# Patient Record
Sex: Female | Born: 1935 | Race: White | Hispanic: No | Marital: Single | Smoking: Never smoker
Health system: Southern US, Community
[De-identification: ages and names within clinical notes are randomized; demographics above are authoritative.]

## PROBLEM LIST (undated history)

## (undated) DIAGNOSIS — I639 Cerebral infarction, unspecified: Secondary | ICD-10-CM

## (undated) DIAGNOSIS — I10 Essential (primary) hypertension: Secondary | ICD-10-CM

## (undated) DIAGNOSIS — R32 Unspecified urinary incontinence: Secondary | ICD-10-CM

## (undated) DIAGNOSIS — R5383 Other fatigue: Secondary | ICD-10-CM

## (undated) DIAGNOSIS — M1990 Unspecified osteoarthritis, unspecified site: Secondary | ICD-10-CM

## (undated) DIAGNOSIS — E785 Hyperlipidemia, unspecified: Secondary | ICD-10-CM

## (undated) HISTORY — PX: VEIN LIGATION: SHX2652

## (undated) HISTORY — DX: Unspecified urinary incontinence: R32

## (undated) HISTORY — DX: Unspecified osteoarthritis, unspecified site: M19.90

## (undated) HISTORY — DX: Essential (primary) hypertension: I10

## (undated) HISTORY — DX: Hyperlipidemia, unspecified: E78.5

## (undated) HISTORY — DX: Other fatigue: R53.83

## (undated) HISTORY — PX: CATARACT EXTRACTION: SUR2

## (undated) HISTORY — DX: Cerebral infarction, unspecified: I63.9

## (undated) HISTORY — PX: ABDOMINAL HYSTERECTOMY: SHX81

---

## 1999-08-08 ENCOUNTER — Other Ambulatory Visit: Admission: RE | Admit: 1999-08-08 | Discharge: 1999-09-03 | Payer: Self-pay | Admitting: *Deleted

## 2000-05-12 ENCOUNTER — Ambulatory Visit (HOSPITAL_COMMUNITY): Admission: RE | Admit: 2000-05-12 | Discharge: 2000-05-12 | Payer: Self-pay | Admitting: *Deleted

## 2005-02-14 ENCOUNTER — Ambulatory Visit: Payer: Self-pay | Admitting: Family Medicine

## 2005-02-28 ENCOUNTER — Ambulatory Visit: Payer: Self-pay | Admitting: Family Medicine

## 2005-07-02 ENCOUNTER — Ambulatory Visit: Payer: Self-pay | Admitting: Gastroenterology

## 2005-07-16 ENCOUNTER — Ambulatory Visit: Payer: Self-pay | Admitting: Gastroenterology

## 2005-08-28 ENCOUNTER — Ambulatory Visit: Payer: Self-pay | Admitting: Family Medicine

## 2006-02-19 ENCOUNTER — Encounter: Admission: RE | Admit: 2006-02-19 | Discharge: 2006-02-19 | Payer: Self-pay | Admitting: Family Medicine

## 2006-02-19 ENCOUNTER — Other Ambulatory Visit: Admission: RE | Admit: 2006-02-19 | Discharge: 2006-02-19 | Payer: Self-pay | Admitting: Family Medicine

## 2006-02-19 ENCOUNTER — Ambulatory Visit: Payer: Self-pay | Admitting: Family Medicine

## 2006-02-26 ENCOUNTER — Encounter: Admission: RE | Admit: 2006-02-26 | Discharge: 2006-02-26 | Payer: Self-pay | Admitting: Family Medicine

## 2006-08-06 ENCOUNTER — Ambulatory Visit: Payer: Self-pay | Admitting: Family Medicine

## 2006-11-21 ENCOUNTER — Ambulatory Visit: Payer: Self-pay | Admitting: Family Medicine

## 2007-02-23 DIAGNOSIS — M1990 Unspecified osteoarthritis, unspecified site: Secondary | ICD-10-CM | POA: Insufficient documentation

## 2007-02-23 DIAGNOSIS — Z8719 Personal history of other diseases of the digestive system: Secondary | ICD-10-CM

## 2007-02-23 DIAGNOSIS — T148XXA Other injury of unspecified body region, initial encounter: Secondary | ICD-10-CM | POA: Insufficient documentation

## 2007-02-23 DIAGNOSIS — I10 Essential (primary) hypertension: Secondary | ICD-10-CM | POA: Insufficient documentation

## 2007-03-10 ENCOUNTER — Ambulatory Visit: Payer: Self-pay | Admitting: Family Medicine

## 2007-03-10 DIAGNOSIS — H8110 Benign paroxysmal vertigo, unspecified ear: Secondary | ICD-10-CM

## 2007-03-11 ENCOUNTER — Encounter: Payer: Self-pay | Admitting: Family Medicine

## 2007-03-26 ENCOUNTER — Ambulatory Visit: Payer: Self-pay | Admitting: Family Medicine

## 2007-04-13 ENCOUNTER — Encounter: Payer: Self-pay | Admitting: Family Medicine

## 2007-04-17 ENCOUNTER — Telehealth: Payer: Self-pay | Admitting: Family Medicine

## 2007-04-17 ENCOUNTER — Ambulatory Visit: Payer: Self-pay | Admitting: Family Medicine

## 2007-06-01 ENCOUNTER — Ambulatory Visit: Payer: Self-pay | Admitting: Family Medicine

## 2007-07-02 ENCOUNTER — Ambulatory Visit: Payer: Self-pay | Admitting: Family Medicine

## 2007-07-02 DIAGNOSIS — M545 Low back pain, unspecified: Secondary | ICD-10-CM | POA: Insufficient documentation

## 2007-07-02 DIAGNOSIS — M25559 Pain in unspecified hip: Secondary | ICD-10-CM

## 2007-07-23 ENCOUNTER — Encounter: Payer: Self-pay | Admitting: Family Medicine

## 2007-07-25 ENCOUNTER — Encounter: Admission: RE | Admit: 2007-07-25 | Discharge: 2007-07-25 | Payer: Self-pay | Admitting: Sports Medicine

## 2007-07-27 ENCOUNTER — Ambulatory Visit: Payer: Self-pay | Admitting: Family Medicine

## 2007-07-31 ENCOUNTER — Encounter (INDEPENDENT_AMBULATORY_CARE_PROVIDER_SITE_OTHER): Payer: Self-pay | Admitting: *Deleted

## 2007-09-08 ENCOUNTER — Encounter: Payer: Self-pay | Admitting: Family Medicine

## 2007-09-08 ENCOUNTER — Telehealth (INDEPENDENT_AMBULATORY_CARE_PROVIDER_SITE_OTHER): Payer: Self-pay | Admitting: *Deleted

## 2007-09-08 ENCOUNTER — Emergency Department (HOSPITAL_COMMUNITY): Admission: EM | Admit: 2007-09-08 | Discharge: 2007-09-08 | Payer: Self-pay | Admitting: Emergency Medicine

## 2007-09-09 ENCOUNTER — Telehealth: Payer: Self-pay | Admitting: Family Medicine

## 2007-10-14 ENCOUNTER — Telehealth (INDEPENDENT_AMBULATORY_CARE_PROVIDER_SITE_OTHER): Payer: Self-pay | Admitting: *Deleted

## 2007-10-14 ENCOUNTER — Ambulatory Visit: Payer: Self-pay | Admitting: Family Medicine

## 2007-10-19 ENCOUNTER — Encounter (INDEPENDENT_AMBULATORY_CARE_PROVIDER_SITE_OTHER): Payer: Self-pay | Admitting: *Deleted

## 2008-01-08 ENCOUNTER — Telehealth (INDEPENDENT_AMBULATORY_CARE_PROVIDER_SITE_OTHER): Payer: Self-pay | Admitting: *Deleted

## 2008-01-11 ENCOUNTER — Encounter: Payer: Self-pay | Admitting: Family Medicine

## 2008-01-11 ENCOUNTER — Telehealth (INDEPENDENT_AMBULATORY_CARE_PROVIDER_SITE_OTHER): Payer: Self-pay | Admitting: *Deleted

## 2008-01-19 ENCOUNTER — Encounter: Payer: Self-pay | Admitting: Family Medicine

## 2008-01-19 ENCOUNTER — Telehealth (INDEPENDENT_AMBULATORY_CARE_PROVIDER_SITE_OTHER): Payer: Self-pay | Admitting: *Deleted

## 2008-01-25 ENCOUNTER — Encounter: Payer: Self-pay | Admitting: Family Medicine

## 2008-01-25 ENCOUNTER — Telehealth (INDEPENDENT_AMBULATORY_CARE_PROVIDER_SITE_OTHER): Payer: Self-pay | Admitting: *Deleted

## 2008-02-01 ENCOUNTER — Telehealth (INDEPENDENT_AMBULATORY_CARE_PROVIDER_SITE_OTHER): Payer: Self-pay | Admitting: *Deleted

## 2008-02-04 ENCOUNTER — Telehealth (INDEPENDENT_AMBULATORY_CARE_PROVIDER_SITE_OTHER): Payer: Self-pay | Admitting: *Deleted

## 2008-02-24 ENCOUNTER — Telehealth (INDEPENDENT_AMBULATORY_CARE_PROVIDER_SITE_OTHER): Payer: Self-pay | Admitting: *Deleted

## 2008-03-01 ENCOUNTER — Encounter: Payer: Self-pay | Admitting: Family Medicine

## 2008-03-14 ENCOUNTER — Encounter (INDEPENDENT_AMBULATORY_CARE_PROVIDER_SITE_OTHER): Payer: Self-pay | Admitting: *Deleted

## 2008-05-24 ENCOUNTER — Ambulatory Visit: Payer: Self-pay | Admitting: Family Medicine

## 2008-05-26 ENCOUNTER — Encounter (INDEPENDENT_AMBULATORY_CARE_PROVIDER_SITE_OTHER): Payer: Self-pay | Admitting: *Deleted

## 2008-06-05 LAB — CONVERTED CEMR LAB
AST: 24 units/L (ref 0–37)
Alkaline Phosphatase: 56 units/L (ref 39–117)
BUN: 9 mg/dL (ref 6–23)
Basophils Absolute: 0.1 10*3/uL (ref 0.0–0.1)
CO2: 29 meq/L (ref 19–32)
Chloride: 103 meq/L (ref 96–112)
Eosinophils Absolute: 0.3 10*3/uL (ref 0.0–0.7)
Eosinophils Relative: 4.4 % (ref 0.0–5.0)
GFR calc non Af Amer: 86 mL/min
HDL: 62 mg/dL (ref >39.0)
Lymphocytes Relative: 24.5 % (ref 12.0–46.0)
MCV: 93.8 fL (ref 78.0–100.0)
Neutrophils Relative %: 62.7 % (ref 43.0–77.0)
Platelets: 200 10*3/uL (ref 150–400)
Potassium: 4.1 meq/L (ref 3.5–5.1)
Total Bilirubin: 0.8 mg/dL (ref 0.3–1.2)
Triglycerides: 84 mg/dL (ref 0–149)
VLDL: 17 mg/dL (ref 0–40)
WBC: 7.4 10*3/uL (ref 4.5–10.5)

## 2008-06-06 ENCOUNTER — Ambulatory Visit: Payer: Self-pay | Admitting: Family Medicine

## 2008-06-06 ENCOUNTER — Encounter (INDEPENDENT_AMBULATORY_CARE_PROVIDER_SITE_OTHER): Payer: Self-pay | Admitting: *Deleted

## 2008-06-06 DIAGNOSIS — N390 Urinary tract infection, site not specified: Secondary | ICD-10-CM

## 2008-06-07 ENCOUNTER — Encounter: Payer: Self-pay | Admitting: Family Medicine

## 2008-06-08 ENCOUNTER — Encounter (INDEPENDENT_AMBULATORY_CARE_PROVIDER_SITE_OTHER): Payer: Self-pay | Admitting: *Deleted

## 2008-06-14 ENCOUNTER — Encounter (INDEPENDENT_AMBULATORY_CARE_PROVIDER_SITE_OTHER): Payer: Self-pay | Admitting: *Deleted

## 2008-07-11 ENCOUNTER — Ambulatory Visit: Payer: Self-pay | Admitting: Family Medicine

## 2008-07-18 ENCOUNTER — Encounter: Payer: Self-pay | Admitting: Family Medicine

## 2008-08-29 ENCOUNTER — Encounter: Payer: Self-pay | Admitting: Family Medicine

## 2008-10-03 ENCOUNTER — Ambulatory Visit: Payer: Self-pay | Admitting: Family Medicine

## 2008-10-03 DIAGNOSIS — M79609 Pain in unspecified limb: Secondary | ICD-10-CM

## 2008-10-04 ENCOUNTER — Ambulatory Visit: Payer: Self-pay | Admitting: Family Medicine

## 2009-05-15 ENCOUNTER — Encounter: Payer: Self-pay | Admitting: Family Medicine

## 2009-05-29 ENCOUNTER — Ambulatory Visit: Payer: Self-pay | Admitting: Family Medicine

## 2009-05-29 DIAGNOSIS — E785 Hyperlipidemia, unspecified: Secondary | ICD-10-CM

## 2009-06-02 ENCOUNTER — Encounter (INDEPENDENT_AMBULATORY_CARE_PROVIDER_SITE_OTHER): Payer: Self-pay | Admitting: *Deleted

## 2009-06-08 ENCOUNTER — Ambulatory Visit: Payer: Self-pay | Admitting: Family Medicine

## 2009-07-04 ENCOUNTER — Ambulatory Visit: Payer: Self-pay | Admitting: Family Medicine

## 2009-07-05 ENCOUNTER — Encounter: Payer: Self-pay | Admitting: Family Medicine

## 2009-07-05 LAB — CONVERTED CEMR LAB
OCCULT 2: NEGATIVE
OCCULT 3: NEGATIVE

## 2009-08-11 ENCOUNTER — Ambulatory Visit: Payer: Self-pay | Admitting: Family Medicine

## 2009-09-07 ENCOUNTER — Ambulatory Visit: Payer: Self-pay | Admitting: Family Medicine

## 2009-11-01 ENCOUNTER — Ambulatory Visit: Payer: Self-pay | Admitting: Family

## 2009-11-01 ENCOUNTER — Inpatient Hospital Stay (HOSPITAL_COMMUNITY): Admission: AD | Admit: 2009-11-01 | Discharge: 2009-11-03 | Payer: Self-pay

## 2009-11-01 DIAGNOSIS — R5383 Other fatigue: Secondary | ICD-10-CM

## 2009-11-01 DIAGNOSIS — Z8679 Personal history of other diseases of the circulatory system: Secondary | ICD-10-CM | POA: Insufficient documentation

## 2009-11-01 DIAGNOSIS — R5381 Other malaise: Secondary | ICD-10-CM

## 2009-11-01 DIAGNOSIS — I639 Cerebral infarction, unspecified: Secondary | ICD-10-CM

## 2009-11-01 HISTORY — DX: Cerebral infarction, unspecified: I63.9

## 2009-11-02 ENCOUNTER — Encounter (INDEPENDENT_AMBULATORY_CARE_PROVIDER_SITE_OTHER): Payer: Self-pay | Admitting: Internal Medicine

## 2009-11-03 ENCOUNTER — Encounter: Payer: Self-pay | Admitting: Family Medicine

## 2009-11-06 ENCOUNTER — Telehealth: Payer: Self-pay | Admitting: Family Medicine

## 2009-11-07 ENCOUNTER — Telehealth (INDEPENDENT_AMBULATORY_CARE_PROVIDER_SITE_OTHER): Payer: Self-pay | Admitting: *Deleted

## 2009-11-08 ENCOUNTER — Encounter: Admission: RE | Admit: 2009-11-08 | Discharge: 2009-12-14 | Payer: Self-pay | Admitting: Family Medicine

## 2009-11-09 ENCOUNTER — Ambulatory Visit: Payer: Self-pay | Admitting: Family Medicine

## 2009-11-13 ENCOUNTER — Encounter: Payer: Self-pay | Admitting: Family Medicine

## 2009-11-14 ENCOUNTER — Encounter: Payer: Self-pay | Admitting: Family Medicine

## 2009-11-15 LAB — CONVERTED CEMR LAB
Bacteria, UA: NONE SEEN
Bilirubin Urine: NEGATIVE
Hemoglobin, Urine: NEGATIVE
Ketones, ur: NEGATIVE mg/dL
Protein, ur: NEGATIVE mg/dL
RBC / HPF: NONE SEEN (ref <3)
Urobilinogen, UA: 0.2 (ref 0.0–1.0)

## 2009-12-01 ENCOUNTER — Telehealth: Payer: Self-pay | Admitting: Family Medicine

## 2009-12-14 ENCOUNTER — Encounter: Payer: Self-pay | Admitting: Family Medicine

## 2010-01-11 ENCOUNTER — Telehealth (INDEPENDENT_AMBULATORY_CARE_PROVIDER_SITE_OTHER): Payer: Self-pay | Admitting: *Deleted

## 2010-02-05 ENCOUNTER — Encounter: Payer: Self-pay | Admitting: Family Medicine

## 2010-02-06 ENCOUNTER — Ambulatory Visit: Payer: Self-pay | Admitting: Family Medicine

## 2010-02-06 DIAGNOSIS — I739 Peripheral vascular disease, unspecified: Secondary | ICD-10-CM | POA: Insufficient documentation

## 2010-02-08 ENCOUNTER — Encounter: Payer: Self-pay | Admitting: Family Medicine

## 2010-02-09 ENCOUNTER — Encounter: Payer: Self-pay | Admitting: Family Medicine

## 2010-02-09 ENCOUNTER — Ambulatory Visit: Payer: Self-pay

## 2010-02-09 ENCOUNTER — Telehealth: Payer: Self-pay | Admitting: Family Medicine

## 2010-04-17 ENCOUNTER — Telehealth (INDEPENDENT_AMBULATORY_CARE_PROVIDER_SITE_OTHER): Payer: Self-pay | Admitting: *Deleted

## 2010-05-08 ENCOUNTER — Telehealth: Payer: Self-pay | Admitting: Family Medicine

## 2010-05-10 ENCOUNTER — Ambulatory Visit: Payer: Self-pay | Admitting: Family Medicine

## 2010-05-10 DIAGNOSIS — IMO0001 Reserved for inherently not codable concepts without codable children: Secondary | ICD-10-CM | POA: Insufficient documentation

## 2010-05-11 LAB — CONVERTED CEMR LAB: Total CK: 147 units/L (ref 7–177)

## 2010-05-25 ENCOUNTER — Telehealth: Payer: Self-pay | Admitting: Family Medicine

## 2010-07-25 ENCOUNTER — Ambulatory Visit: Payer: Self-pay | Admitting: Family Medicine

## 2010-07-26 ENCOUNTER — Encounter: Payer: Self-pay | Admitting: Family Medicine

## 2010-07-26 ENCOUNTER — Telehealth (INDEPENDENT_AMBULATORY_CARE_PROVIDER_SITE_OTHER): Payer: Self-pay | Admitting: *Deleted

## 2010-07-30 ENCOUNTER — Encounter: Payer: Self-pay | Admitting: Family Medicine

## 2010-07-31 ENCOUNTER — Encounter: Payer: Self-pay | Admitting: Family Medicine

## 2010-07-31 ENCOUNTER — Encounter: Admission: RE | Admit: 2010-07-31 | Discharge: 2010-07-31 | Payer: Self-pay | Admitting: Neurology

## 2010-08-01 ENCOUNTER — Encounter: Payer: Self-pay | Admitting: Family Medicine

## 2010-08-09 ENCOUNTER — Encounter: Payer: Self-pay | Admitting: Family Medicine

## 2010-08-09 ENCOUNTER — Ambulatory Visit: Payer: Self-pay | Admitting: Family Medicine

## 2010-08-09 DIAGNOSIS — I6350 Cerebral infarction due to unspecified occlusion or stenosis of unspecified cerebral artery: Secondary | ICD-10-CM | POA: Insufficient documentation

## 2010-08-10 LAB — CONVERTED CEMR LAB
ALT: 23 units/L (ref 0–35)
AST: 30 units/L (ref 0–37)
Albumin: 3.9 g/dL (ref 3.5–5.2)
BUN: 11 mg/dL (ref 6–23)
Basophils Relative: 0.4 % (ref 0.0–3.0)
Chloride: 101 meq/L (ref 96–112)
Direct LDL: 114.4 mg/dL
Eosinophils Relative: 1.5 % (ref 0.0–5.0)
GFR calc non Af Amer: 81.21 mL/min (ref >60)
HCT: 42.4 % (ref 36.0–46.0)
Hemoglobin: 14.3 g/dL (ref 12.0–15.0)
Hgb A1c MFr Bld: 5.9 % (ref 4.6–6.5)
Lymphs Abs: 1.6 10*3/uL (ref 0.7–4.0)
MCV: 95.2 fL (ref 78.0–100.0)
Monocytes Absolute: 0.5 10*3/uL (ref 0.1–1.0)
Monocytes Relative: 5.4 % (ref 3.0–12.0)
Neutro Abs: 6.5 10*3/uL (ref 1.4–7.7)
Platelets: 203 10*3/uL (ref 150.0–400.0)
Potassium: 4.5 meq/L (ref 3.5–5.1)
RBC: 4.46 M/uL (ref 3.87–5.11)
Sodium: 137 meq/L (ref 135–145)
Total Protein: 7.1 g/dL (ref 6.0–8.3)
VLDL: 16.4 mg/dL (ref 0.0–40.0)
WBC: 8.8 10*3/uL (ref 4.5–10.5)

## 2010-09-07 ENCOUNTER — Ambulatory Visit: Payer: Self-pay | Admitting: Family Medicine

## 2010-09-18 ENCOUNTER — Encounter: Payer: Self-pay | Admitting: Family Medicine

## 2010-09-19 ENCOUNTER — Telehealth: Payer: Self-pay | Admitting: Family Medicine

## 2010-11-04 LAB — CONVERTED CEMR LAB
ALT: 19 units/L (ref 0–35)
ALT: 22 units/L (ref 0–35)
ALT: 23 units/L (ref 0–40)
AST: 25 units/L (ref 0–37)
AST: 26 units/L (ref 0–37)
Albumin: 3.6 g/dL (ref 3.5–5.2)
Alkaline Phosphatase: 55 units/L (ref 39–117)
BUN: 7 mg/dL (ref 6–23)
BUN: 8 mg/dL (ref 6–23)
Basophils Absolute: 0.1 10*3/uL (ref 0.0–0.1)
Basophils Relative: 0.8 % (ref 0.0–1.0)
Basophils Relative: 0.8 % (ref 0.0–3.0)
Bilirubin Urine: NEGATIVE
Bilirubin, Direct: 0 mg/dL (ref 0.0–0.3)
Bilirubin, Direct: 0.1 mg/dL (ref 0.0–0.3)
Blood in Urine, dipstick: NEGATIVE
CO2: 32 meq/L (ref 19–32)
Calcium: 9.1 mg/dL (ref 8.4–10.5)
Calcium: 9.6 mg/dL (ref 8.4–10.5)
Chloride: 102 meq/L (ref 96–112)
Chloride: 104 meq/L (ref 96–112)
Chloride: 104 meq/L (ref 96–112)
Cholesterol: 203 mg/dL — ABNORMAL HIGH (ref 0–200)
Cholesterol: 211 mg/dL (ref 0–200)
Creatinine, Ser: 0.7 mg/dL (ref 0.4–1.2)
Creatinine, Ser: 0.7 mg/dL (ref 0.4–1.2)
Direct LDL: 104.2 mg/dL
Eosinophils Relative: 1.9 % (ref 0.0–5.0)
Eosinophils Relative: 2.9 % (ref 0.0–5.0)
GFR calc non Af Amer: 85.5 mL/min (ref >60)
Glucose, Urine, Semiquant: NEGATIVE
HCT: 38.4 % (ref 36.0–46.0)
HDL: 78.5 mg/dL (ref >39.00)
Hemoglobin: 14.3 g/dL (ref 12.0–15.0)
Ketones, urine, test strip: NEGATIVE
Lymphs Abs: 1.5 10*3/uL (ref 0.7–4.0)
MCHC: 34.6 g/dL (ref 30.0–36.0)
MCV: 93 fL (ref 78.0–100.0)
MCV: 94.9 fL (ref 78.0–100.0)
Monocytes Absolute: 0.5 10*3/uL (ref 0.1–1.0)
Monocytes Absolute: 0.5 10*3/uL (ref 0.1–1.0)
Monocytes Relative: 4.8 % (ref 3.0–11.0)
Monocytes Relative: 5.8 % (ref 3.0–12.0)
Neutrophils Relative %: 68.6 % (ref 43.0–77.0)
Neutrophils Relative %: 75.8 % (ref 43.0–77.0)
Pap Smear: NORMAL
Platelets: 204 10*3/uL (ref 150–400)
Platelets: 213 10*3/uL (ref 150.0–400.0)
Potassium: 4.1 meq/L (ref 3.5–5.1)
Potassium: 5.1 meq/L (ref 3.5–5.1)
Protein, U semiquant: NEGATIVE
RBC: 4.13 M/uL (ref 3.87–5.11)
RBC: 4.42 M/uL (ref 3.87–5.11)
RDW: 13.6 % (ref 11.5–14.6)
TSH: 1.49 microintl units/mL (ref 0.35–5.50)
Total Bilirubin: 0.6 mg/dL (ref 0.3–1.2)
Total Bilirubin: 0.8 mg/dL (ref 0.3–1.2)
Total CHOL/HDL Ratio: 3.3
Total Protein: 7.2 g/dL (ref 6.0–8.3)
Total Protein: 7.3 g/dL (ref 6.0–8.3)
Triglycerides: 59 mg/dL (ref 0.0–149.0)
Triglycerides: 93 mg/dL (ref 0–149)
VLDL: 11.8 mg/dL (ref 0.0–40.0)
VLDL: 19 mg/dL (ref 0–40)
Vit D, 25-Hydroxy: 58 ng/mL (ref 30–89)
WBC: 7.8 10*3/uL (ref 4.5–10.5)
WBC: 9.3 10*3/uL (ref 4.5–10.5)
pH: 6.5

## 2010-11-06 NOTE — Assessment & Plan Note (Signed)
Summary: DISCUSS LEG HEAVINESS AFTER MINI STROKE/KB   Vital Signs:  Patient profile:   77 year old female Height:      57.75 inches Weight:      145 pounds Temp:     98.8 degrees F oral Pulse rate:   76 / minute BP sitting:   150 / 76  (left arm)  Vitals Entered By: Jeremy Johann CMA (May 10, 2010 9:55 AM) CC: discuss heavyness  in legs   History of Present Illness: Pt here c/o heaviness in L leg since stroke but she feels like it is getting worse.  Pt states she has no pain or numbness.  Pt is worried about falling.     Current Medications (verified): 1)  Vitamin D3 1000 Unit Caps (Cholecalciferol) .Marland Kitchen.. 1 By Mouth Once Daily 2)  Aspirin 325 Mg Tabs (Aspirin) .Marland Kitchen.. 1 By Mouth Once Daily 3)  Crestor 10 Mg Tabs (Rosuvastatin Calcium) .Marland Kitchen.. 1 By Mouth At Bedtime 4)  Metoprolol Tartrate 25 Mg Tabs (Metoprolol Tartrate) .Marland Kitchen.. 1 Tab By Mouth Twice A Day. Needs Ov. 5)  Caltrate 600+d Plus 600-400 Mg-Unit Tabs (Calcium Carbonate-Vit D-Min) .Marland Kitchen.. 1 By Mouth Two Times A Day 6)  Fish Oil 1000 Mg Caps (Omega-3 Fatty Acids) .Marland Kitchen.. 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   Neurologic:  alert & oriented X3, strength normal in all extremities, and gait normal.   Psych:  Cognition and judgment appear intact. Alert and cooperative with normal attention span and concentration. No apparent delusions, illusions, hallucinations   Impression & Recommendations:  Problem # 1:  MYALGIA (ICD-729.1) consider neuro if no improvement Her updated medication list for this problem includes:    Aspirin 325 Mg Tabs (Aspirin) .Marland Kitchen... 1 by mouth once daily  Orders: Venipuncture (63875) Specimen Handling (64332) TLB-CK Total Only(Creatine Kinase/CPK) (82550-CK)  Problem # 2:  WEAKNESS (ICD-780.79)  Complete Medication List: 1)  Vitamin D3 1000 Unit Caps (Cholecalciferol) .Marland Kitchen.. 1 by mouth once daily 2)  Aspirin 325 Mg Tabs (Aspirin) .Marland Kitchen.. 1 by mouth once daily 3)  Crestor 10 Mg Tabs (Rosuvastatin calcium) .Marland Kitchen.. 1 by mouth at bedtime 4)  Metoprolol Tartrate 25 Mg Tabs (Metoprolol tartrate) .Marland Kitchen.. 1 tab by mouth twice a day. needs ov. 5)  Caltrate 600+d Plus 600-400 Mg-unit Tabs (Calcium carbonate-vit d-min) .Marland Kitchen.. 1 by mouth two times a day 6)  Fish Oil 1000 Mg Caps (Omega-3 fatty acids) .Marland Kitchen.. 1 by mouth once daily

## 2010-11-06 NOTE — Miscellaneous (Signed)
Summary: Orders Update  Clinical Lists Changes  Orders: Added new Test order of Arterial Duplex Lower Extremity (Arterial Duplex Low) - Signed 

## 2010-11-06 NOTE — Progress Notes (Signed)
Summary: Gabriela Patel pt status  Phone Note Call from Patient Call back at [phone]   Caller: Patient Summary of Call: Pt left VM stating that she would like for dr lowne to be aware that she had mini stroke and is due to start PT on wed. and she has F/U schedule for thurs with dr Laury Axon. pt states that she is staying with her sister for now and can be reach there if we need to get in touch with her...................Marland KitchenFelecia Deloach CMA  November 06, 2009 12:29 PM

## 2010-11-06 NOTE — Assessment & Plan Note (Signed)
Summary: cpx & lab/cbs-   Vital Signs:  Patient profile:   77 year old female Menstrual status:  hysterectomy Height:      59.75 inches Weight:      144.2 pounds BMI:     28.50 Pulse rate:   72 / minute Pulse rhythm:   regular BP sitting:   130 / 70  (left arm) Cuff size:   regular  Vitals Entered By: Almeta Monas CMA Duncan Dull) (August 09, 2010 9:10 AM) CC: CPX/Fasting  Does patient need assistance? Functional Status Self care, Cook/clean, Shopping, Social activities Ambulation Normal Comments pt is able to do all ADLs and and can read and write  Vision Screening:      Vision Comments: optho--q1y --+ cataracts 40db HL: Left  Right  Audiometry Comment: grossly normal      Menstrual Status hysterectomy Last PAP Result Normal   History of Present Illness: Pt here for cpe and labs.   Pt seeing Dr Pearlean Brownie --Neuro since stroke.  Pt aware she needs to be on meds for cholesterol but she didn't like the crestor.   Derm--Dr Emily Filbert optho--[facility]  Preventive Screening-Counseling & Management  Alcohol-Tobacco     Alcohol drinks/day: 0     Smoking Status: never  Caffeine-Diet-Exercise     Caffeine use/day: 1     Does Patient Exercise: yes     Type of exercise: walk     Times/week: 3  Hep-HIV-STD-Contraception     HIV Risk: no     Dental Visit-last 6 months yes     Dental Care Counseling: not indicated; dental care within six months     SBE monthly: yes     SBE Education/Counseling: not indicated; SBE done regularly  Safety-Violence-Falls     Seat Belt Use: 100      Sexual History:  divorced.        Drug Use:  never.    Problems Prior to Update: 1)  Lacunar Infarction  (ICD-434.91) 2)  Myalgia  (ICD-729.1) 3)  Intermittent Claudication, Left Leg  (ICD-443.9) 4)  Cerebrovascular Accident, Hx of  (ICD-V12.50) 5)  Weakness  (ICD-780.79) 6)  Hyperlipidemia  (ICD-272.4) 7)  Leg Pain, Left  (ICD-729.5) 8)  Uti  (ICD-599.0) 9)  Hip Pain, Left  (ICD-719.45) 10)   Low Back Pain, Mild  (ICD-724.2) 11)  Preventive Health Care  (ICD-V70.0) 12)  Vertigo, Benign Paroxysmal Position  (ICD-386.11) 13)  Family History of Cad Female 1st Degree Relative <50  (ICD-V17.3) 14)  Colonoscopy, Hx of  (ICD-V12.79) 15)  Compression Fracture  (ICD-829.0) 16)  Osteoporosis  (ICD-733.00) 17)  Osteoarthritis  (ICD-715.90) 18)  Hypertension  (ICD-401.9)  Medications Prior to Update: 1)  Vitamin D3 1000 Unit Caps (Cholecalciferol) .Marland Kitchen.. 1 By Mouth Once Daily 2)  Aspirin 325 Mg Tabs (Aspirin) .Marland Kitchen.. 1 By Mouth Once Daily 3)  Crestor 10 Mg Tabs (Rosuvastatin Calcium) .Marland Kitchen.. 1 By Mouth At Bedtime 4)  Metoprolol Tartrate 25 Mg Tabs (Metoprolol Tartrate) .Marland Kitchen.. 1 Tab By Mouth Twice A Day. Needs Ov. 5)  Caltrate 600+d Plus 600-400 Mg-Unit Tabs (Calcium Carbonate-Vit D-Min) .Marland Kitchen.. 1 By Mouth Two Times A Day 6)  Fish Oil 1000 Mg Caps (Omega-3 Fatty Acids) .Marland Kitchen.. 1 By Mouth Once Daily  Current Medications (verified): 1)  Vitamin D3 1000 Unit Caps (Cholecalciferol) .Marland Kitchen.. 1 By Mouth Once Daily 2)  Aspirin 325 Mg Tabs (Aspirin) .Marland Kitchen.. 1 By Mouth Once Daily 3)  Metoprolol Tartrate 25 Mg Tabs (Metoprolol Tartrate) .Marland Kitchen.. 1 Tab By Mouth Twice A  Day. Needs Ov. 4)  Caltrate 600+d Plus 600-400 Mg-Unit Tabs (Calcium Carbonate-Vit D-Min) .Marland Kitchen.. 1 By Mouth Two Times A Day 5)  Fish Oil 1000 Mg Caps (Omega-3 Fatty Acids) .Marland Kitchen.. 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 Family History of CAD Female 1st degree relative 75yo M--- died 02/27/02 b/l hip fx  Social History: Reviewed history from 03/10/2007 and no changes required. Retired [employer] of [place], [place], [place] Divorced Never Smoked Alcohol use-no Drug use-no Regular exercise-yes Sexual History:  divorced  Review of Systems      See HPI General:  Denies chills, fatigue, fever, loss of appetite, malaise, sleep disorder, sweats, weakness, and weight loss. Eyes:  Denies blurring, discharge, double vision, eye irritation, eye pain, halos, itching, light sensitivity, red eye, vision loss-1 eye, and vision loss-both eyes. ENT:  Denies decreased hearing, difficulty swallowing, ear discharge, earache, hoarseness, nasal congestion, nosebleeds, postnasal drainage, ringing in ears, sinus pressure, and sore throat. CV:  Denies bluish discoloration of lips or nails, chest pain or discomfort, difficulty breathing at night, difficulty breathing while lying down, fainting, fatigue, leg cramps with exertion, lightheadness, near fainting, palpitations, shortness of breath with exertion, swelling of feet, swelling of hands, and weight gain. Resp:  Denies chest discomfort, chest pain with inspiration, cough, coughing up blood, excessive snoring, hypersomnolence, morning headaches, pleuritic, shortness of breath, sputum productive, and wheezing. GI:  Denies abdominal pain, bloody stools, change in bowel habits, constipation, dark tarry stools, diarrhea, excessive appetite, gas, hemorrhoids, indigestion, loss of appetite, and nausea. GU:  Denies abnormal vaginal bleeding, decreased libido, discharge, dysuria, genital sores, hematuria, incontinence, nocturia, urinary frequency,  and urinary hesitancy. MS:  Complains of muscle aches; denies joint pain, joint redness, joint swelling, loss of strength, low back pain, mid back pain, muscle , cramps, muscle weakness, stiffness, and thoracic pain. Derm:  Denies changes in color of skin, changes in nail beds, dryness, excessive perspiration, flushing, hair loss, insect bite(s), itching, lesion(s), poor wound healing, and rash. Neuro:  Denies brief paralysis, difficulty with concentration, disturbances in coordination, falling down, headaches, inability to speak, memory loss, numbness, poor balance, seizures, sensation of room spinning, tingling, tremors, visual disturbances, and weakness. Psych:  Denies alternate hallucination ( auditory/visual), anxiety, depression, easily angered, easily tearful, irritability, mental problems, panic attacks, sense of great danger, suicidal thoughts/plans, thoughts of violence, unusual visions or sounds, and thoughts /plans of harming others. Endo:  Denies cold intolerance, excessive hunger, excessive thirst, excessive urination, heat intolerance, polyuria, and weight change. Heme:  Denies abnormal bruising, bleeding, enlarge lymph nodes, fevers, pallor, and skin discoloration. Allergy:  Denies hives or rash, itching eyes, persistent infections, seasonal allergies, and sneezing.  Physical Exam  General:  Well-developed,well-nourished,in no acute distress; alert,appropriate and cooperative throughout examination Head:  Normocephalic and atraumatic without obvious abnormalities. No apparent alopecia or balding. Eyes:  pupils equal, pupils  round, pupils reactive to light, and no injection.   Ears:  External ear exam shows no significant lesions or deformities.  Otoscopic examination reveals clear canals, tympanic membranes are intact bilaterally without bulging, retraction, inflammation or discharge. Hearing is grossly normal bilaterally. Nose:  External nasal examination shows no deformity or  inflammation. Nasal mucosa are pink and moist without lesions or exudates. Mouth:  Oral mucosa and oropharynx without lesions or exudates.  Teeth in good repair. Neck:  No deformities, masses, or tenderness noted. Chest Wall:  No deformities, masses, or tenderness noted. Breasts:  No mass, nodules, thickening, tenderness, bulging, retraction, inflamation, nipple discharge or skin changes noted.   Lungs:  Normal respiratory effort, chest expands symmetrically. Lungs are clear to auscultation, no crackles or wheezes. Heart:  normal rate and no murmur.   Abdomen:  Bowel sounds positive,abdomen soft and non-tender without masses, organomegaly or hernias noted. Msk:  normal ROM, no joint tenderness, no joint swelling, no joint warmth, no redness over joints, no joint deformities, no joint instability, and no crepitation.   Pulses:  R posterior tibial normal, R dorsalis pedis normal, R carotid normal, L popliteal normal, L posterior tibial normal, and L dorsalis pedis normal.   Extremities:  No clubbing, cyanosis, edema, or deformity noted with normal full range of motion of all joints.   Neurologic:  No cranial nerve deficits noted. Station and gait are normal. Plantar reflexes are down-going bilaterally. DTRs are symmetrical throughout. Sensory, motor and coordinative functions appear intact. Skin:  Intact without suspicious lesions or rashes Cervical Nodes:  No lymphadenopathy noted Axillary Nodes:  No palpable lymphadenopathy Psych:  Cognition and judgment appear intact. Alert and cooperative with normal attention span and concentration. No apparent delusions, illusions, hallucinationsnot anxious appearing, not depressed appearing, and not suicidal.     Impression & Recommendations:  Problem # 1:  PREVENTIVE HEALTH CARE (ICD-V70.0) GHM  utd  Orders: Venipuncture (29562) TLB-Lipid Panel (80061-LIPID) TLB-CBC Platelet - w/Differential (85025-CBCD) TLB-A1C / Hgb A1C (Glycohemoglobin)  (83036-A1C) EKG w/ Interpretation (93000) Medicare -1st Annual Wellness Visit [phone])  Problem # 2:  LACUNAR INFARCTION (ICD-434.91)  Her updated medication list for this problem includes:    Aspirin 325 Mg Tabs (Aspirin) .Marland Kitchen... 1 by mouth once daily  Orders: TLB-A1C / Hgb A1C (Glycohemoglobin) (83036-A1C)  Problem # 3:  HYPERLIPIDEMIA (ICD-272.4)  The following medications were removed from the medication list:    Crestor 10 Mg Tabs (Rosuvastatin calcium) .Marland Kitchen... 1 by mouth at bedtime  Orders: Venipuncture (57846) TLB-Lipid Panel (80061-LIPID) TLB-CBC Platelet - w/Differential (85025-CBCD) TLB-A1C / Hgb A1C (Glycohemoglobin) (83036-A1C) T- * Misc. Laboratory test [phone]) Specimen Handling (28413)  Labs Reviewed: SGOT: 26 (02/06/2010)   SGPT: 19 (02/06/2010)   HDL:78.50 (05/29/2009), 62.0 (05/24/2008)  LDL:DEL (05/24/2008), DEL (03/10/2007)  Chol:203 (05/29/2009), 210 (05/24/2008)  Trig:59.0 (05/29/2009), 84 (05/24/2008)  Problem # 4:  OSTEOPOROSIS (ICD-733.00)  Her updated medication list for this problem includes:    Vitamin D3 1000 Unit Caps (Cholecalciferol) .Marland Kitchen... 1 by mouth once daily    Caltrate 600+d Plus 600-400 Mg-unit Tabs (Calcium carbonate-vit d-min) .Marland Kitchen... 1 by mouth two times a day  Orders: Venipuncture (24401) TLB-Lipid Panel (80061-LIPID) TLB-CBC Platelet - w/Differential (85025-CBCD) TLB-A1C / Hgb A1C (Glycohemoglobin) (83036-A1C) T-Vitamin D (25-Hydroxy) (02725-36644) Specimen Handling (03474)  Bone Density: normal (03/23/2008) Vit D:55 (02/06/2010), 56 (09/07/2009)  Problem # 5:  OSTEOARTHRITIS (ICD-715.90)  Her updated medication list for this problem includes:    Aspirin 325 Mg Tabs (Aspirin) .Marland Kitchen... 1 by mouth once daily  Discussed use of medications, application of heat or cold, and exercises.   Problem # 6:  HYPERTENSION (ICD-401.9)  Her updated medication list for this problem includes:    Metoprolol Tartrate 25 Mg Tabs (Metoprolol tartrate)  .Marland Kitchen... 1 tab by mouth twice a day. needs ov.  Orders: Venipuncture (04540) TLB-Lipid Panel (80061-LIPID) TLB-CBC Platelet - w/Differential (85025-CBCD) TLB-A1C / Hgb A1C (Glycohemoglobin) (83036-A1C)  BP today: 130/70 Prior BP: 150/76 (05/10/2010)  Labs Reviewed: K+: 4.1 (02/06/2010) Creat: : 0.7 (02/06/2010)   Chol: 203 (05/29/2009)   HDL: 78.50 (05/29/2009)   LDL: DEL (05/24/2008)   TG: 59.0 (05/29/2009)  Complete Medication List: 1)  Vitamin D3 1000 Unit Caps (Cholecalciferol) .Marland Kitchen.. 1 by mouth once daily 2)  Aspirin 325 Mg Tabs (Aspirin) .Marland Kitchen.. 1 by mouth once daily 3)  Metoprolol Tartrate 25 Mg Tabs (Metoprolol tartrate) .Marland Kitchen.. 1 tab by mouth twice a day. needs ov. 4)  Caltrate 600+d Plus 600-400 Mg-unit Tabs (Calcium carbonate-vit d-min) .Marland Kitchen.. 1 by mouth two times a day 5)  Fish Oil 1000 Mg Caps (Omega-3 fatty acids) .Marland Kitchen.. 1 by mouth once daily   Orders Added: 1)  Venipuncture [36415] 2)  TLB-Lipid Panel [80061-LIPID] 3)  TLB-CBC Platelet - w/Differential [85025-CBCD] 4)  TLB-A1C / Hgb A1C (Glycohemoglobin) [83036-A1C] 5)  T-Vitamin D (25-Hydroxy) [98119-14782] 6)  T- * Misc. Laboratory test [phone] 7)  Specimen Handling [99000] 8)  Est. Patient 65& > [99397] 9)  EKG w/ Interpretation [93000] 10)  Medicare -1st Annual Wellness Visit [G0438] 11)  Est. Patient Level III [30865]    Last Flu Vaccine:  Fluvax 3+ (07/25/2010 2:01:23 PM) Flu Vaccine Result Date:  07/25/2010 Flu Vaccine Result:  given Flu Vaccine Next Due:  1 yr Last PAP:  Normal (03/10/2007 8:22:42 AM) PAP Next Due:  Not Indicated Last Mammogram:  normal (05/15/2009 10:05:34 AM) Mammogram Result Date:  07/30/2010 Mammogram Result:  normal Mammogram Next Due:  1 yr Last Bone Density:  normal (03/23/2008 10:06:34 AM) Bone Density Result Date:  07/30/2010 Bone Density Next Due: 2 yr  Appended Document: cpx & lab/cbs-     Does patient need assistance? Functional Status Self care, Cook/clean, Shopping,  Social activities Ambulation Normal   Preventive Screening-Counseling & Management  Safety-Violence-Falls     Firearms in the Home: no firearms in the home     Smoke Detectors: yes     Violence in the Home: no risk noted     Sexual Abuse: no     Fall Risk: no  Allergies: No Known Drug Allergies  Social History: Fall Risk:  no   Complete Medication List: 1)  Vitamin D3 1000 Unit Caps (Cholecalciferol) .Marland Kitchen.. 1 by mouth once daily 2)  Aspirin 325 Mg Tabs (Aspirin) .Marland Kitchen.. 1 by mouth once daily 3)  Metoprolol Tartrate 25 Mg Tabs (Metoprolol tartrate) .Marland Kitchen.. 1 tab by mouth twice a day. needs ov. 4)  Caltrate 600+d Plus 600-400 Mg-unit Tabs (Calcium carbonate-vit d-min) .Marland Kitchen.. 1 by mouth two times a day 5)  Fish Oil 1000 Mg Caps (Omega-3 fatty acids) .Marland Kitchen.. 1 by mouth once daily

## 2010-11-06 NOTE — Miscellaneous (Signed)
Summary: PT Discharge/[facility]  PT Discharge/[facility]   Imported By: Lanelle Bal 12/22/2009 09:31:34  _____________________________________________________________________  External Attachment:    Type:   Image     Comment:   External Document

## 2010-11-06 NOTE — Consult Note (Signed)
Summary: [facility]  [facility]   Imported By: Lanelle Bal 08/07/2010 12:12:12  _____________________________________________________________________  External Attachment:    Type:   Image     Comment:   External Document

## 2010-11-06 NOTE — Progress Notes (Signed)
Summary: [facility]  Phone Note Call from Patient Call back at Home Phone [phone]   Caller: Patient Summary of Call: Pt left VM that she feels that crestor is causing her to have bodyache. Pt would like to know if it would be possible to change med.pls advise..................Marland KitchenFelecia Deloach CMA  Feb 09, 2010 2:12 PM   Follow-up for Phone Call        can she try to break them in half and see if that makes a difference?  If not better call back Follow-up by: Loreen Freud DO,  Feb 09, 2010 2:34 PM  Additional Follow-up for Phone Call Additional follow up Details #1::        PT AWARE...................Marland KitchenFelecia Deloach CMA  Feb 09, 2010 2:44 PM

## 2010-11-06 NOTE — Medication Information (Signed)
Summary: Letter Regarding Crestor & Myalgia/[facility]  Letter Regarding Crestor & Myalgia/[facility]   Imported By: Lanelle Bal 02/13/2010 14:19:34  _____________________________________________________________________  External Attachment:    Type:   Image     Comment:   External Document

## 2010-11-06 NOTE — Progress Notes (Signed)
Summary: TWO 91 DAY PRESCRIPTIONS FOR [facility]  Phone Note Call from Patient Call back at Home Phone [phone]   Caller: Patient Summary of Call: PATIENT DROPPED OFF TWO [facility] FORMS FOR 90 DAY PRESCRIPTIONS PLUS REFILLS  1) METOPROLOL TARTRATE 25 MG  2) CRESTOR 20 MG  PLEASE COMPLETE AND SEND OFF TO [facility]  WILL TAKE TO [facility] IN PLASTIC SLEEVE Initial call taken by: Jerolyn Shin,  April 17, 2010 11:01 AM    Prescriptions: METOPROLOL TARTRATE 25 MG TABS (METOPROLOL TARTRATE) 1 tab by mouth twice a day. NEEDS OV.  #180 x 1   Entered by:   Jeremy Johann CMA   Authorized by:   Loreen Freud DO   Signed by:   Jeremy Johann CMA on 04/17/2010   Method used:   Faxed to ...       [facility]* (retail)             ,          Ph: [phone]       Fax: [phone]   RxID:   [id number] CRESTOR 20 MG TABS (ROSUVASTATIN CALCIUM) 1 tab by mouth every day. NEEDS LABWORK.  #90 x 1   Entered by:   Jeremy Johann CMA   Authorized by:   Loreen Freud DO   Signed by:   Jeremy Johann CMA on 04/17/2010   Method used:   Faxed to ...       [facility]* (retail)             ,          Ph: [phone]       Fax: [phone]   RxID:   [id number]

## 2010-11-06 NOTE — Progress Notes (Signed)
Summary: [facility]  Prescriptions: METOPROLOL TARTRATE 25 MG TABS (METOPROLOL TARTRATE) 1 tab by mouth twice a day. NEEDS OV.  #180 x 2   Entered by:   Almeta Monas CMA (AAMA)   Authorized by:   Loreen Freud DO   Signed by:   Almeta Monas CMA (AAMA) on 07/26/2010   Method used:   Faxed to ...       [facility] (mail-order)             , [place]         Ph: [phone]       Fax: [phone]   RxID:   [id number]

## 2010-11-06 NOTE — Progress Notes (Signed)
Summary: change med  Phone Note Call from Patient Call back at Home Phone [phone]   Caller: Patient Summary of Call: pt states that she was told to increases  to ASA 325  by hospital physician after a mini stroke last month. pt has in the past only taken asa 81 once daily.  pt would like to know if she needs to continue with asa 325 or is it ok to decrease back to asa 81. pls advise...........Marland KitchenFelecia Deloach CMA  December 01, 2009 10:46 AM   Follow-up for Phone Call        after stroke she should be taking 325mg  aspirin Follow-up by: Loreen Freud DO,  December 01, 2009 11:31 AM  Additional Follow-up for Phone Call Additional follow up Details #1::        pt aware.........Marland KitchenFelecia Deloach CMA  December 01, 2009 11:46 AM

## 2010-11-06 NOTE — Assessment & Plan Note (Signed)
Summary: post hospital/kdc   Vital Signs:  Patient profile:   77 year old female Height:      57.75 inches Weight:      143.2 pounds BMI:     30.30 Pulse rate:   68 / minute BP sitting:   148 / 60  Serial Vital Signs/Assessments:  Time      Position  BP       Pulse  Resp  Temp     By 3:35 PM             140/78                         Gabriela Freud DO  CC: hospital follow up   History of Present Illness: Pt here for f/u from hospitalization for Acute CVA, HTN,  UTI.    Pt feeling much better /stronger.  She has started home PT.     Problems Prior to Update: 1)  Weakness  (ICD-780.79) 2)  Hyperlipidemia  (ICD-272.4) 3)  Leg Pain, Left  (ICD-729.5) 4)  Uti  (ICD-599.0) 5)  Hip Pain, Left  (ICD-719.45) 6)  Low Back Pain, Mild  (ICD-724.2) 7)  Preventive Health Care  (ICD-V70.0) 8)  Vertigo, Benign Paroxysmal Position  (ICD-386.11) 9)  Family History of Cad Female 1st Degree Relative <50  (ICD-V17.3) 10)  Colonoscopy, Hx of  (ICD-V12.79) 11)  Compression Fracture  (ICD-829.0) 12)  Osteoporosis  (ICD-733.00) 13)  Osteoarthritis  (ICD-715.90) 14)  Hypertension  (ICD-401.9)  Medications Prior to Update: 1)  Vitamin D 400 Unit Tabs (Cholecalciferol) .... Take 1 Tab Once Daily  Current Medications (verified): 1)  Vitamin D 400 Unit Tabs (Cholecalciferol) .... Take 1 Tab Once Daily 2)  Ciprofloxacin Hcl 250 Mg Tabs (Ciprofloxacin Hcl) .Marland Kitchen.. 1 Tablet By Mouth Twice A Day 3)  Aspirin 325 Mg Tabs (Aspirin) .Marland Kitchen.. 1t;qd 4)  Crestor 20 Mg Tabs (Rosuvastatin Calcium) .Marland Kitchen.. 1t By Mouth Every Day 5)  Metoprolol Tartrate 25 Mg Tabs (Metoprolol Tartrate) .Marland Kitchen.. 1t Twice A Day  Allergies (verified): No Known Drug Allergies  Past History:  Past Surgical History: Last updated: 02/23/2007 Hysterectomy RIGHT LEG VEIN LIGATION Cataract extraction-LEFT  Family History: Last updated: 06/06/2008 Family History of CAD Female 1st degree relative 75yo M--- died 03/01/02 b/l hip fx  Social  History: Last updated: 03/10/2007 Retired Mar 01, 1994 Dept of [place], [place], [place] Divorced Never Smoked Alcohol use-no Drug use-no Regular exercise-yes  Risk Factors: Alcohol Use: 0 (06/08/2009) Caffeine Use: 1 (06/08/2009) Exercise: yes (06/08/2009)  Risk Factors: Smoking Status: never (06/08/2009)  Past Medical History: Hypertension Osteoarthritis Osteoporosis Cerebrovascular accident, hx of (11/01/2009)  Family History: Reviewed history from 06/06/2008 and no changes required. Family History of CAD Female 1st degree relative 75yo M--- died 03/01/2002 b/l hip fx  Social History: Reviewed history from 03/10/2007 and no changes required. Retired [employer] of [place], [place], [place] Divorced Never Smoked Alcohol use-no Drug use-no Regular exercise-yes  Review of Systems      See HPI  Physical Exam  General:  Well-developed,well-nourished,in no acute distress; alert,appropriate and cooperative throughout examination Neck:  No deformities, masses, or tenderness noted. Lungs:  Normal respiratory effort, chest expands symmetrically. Lungs are clear to auscultation, no crackles or wheezes. Heart:  normal rate and no murmur.   Extremities:  No clubbing, cyanosis, edema, or deformity noted with normal full range of motion of all joints.   Neurologic:  cranial nerves II-XII intact and strength normal in all extremities.  Impression & Recommendations:  Problem # 1:  CEREBROVASCULAR ACCIDENT, HX OF (ICD-V12.50) Assessment Improved PT/OT at home  Problem # 2:  HYPERLIPIDEMIA (ICD-272.4)  Her updated medication list for this problem includes:    Crestor 20 Mg Tabs (Rosuvastatin calcium) .Marland Kitchen... 1t by mouth every day  Labs Reviewed: SGOT: 25 (05/29/2009)   SGPT: 22 (05/29/2009)   HDL:78.50 (05/29/2009), 62.0 (05/24/2008)  LDL:DEL (05/24/2008), DEL (03/10/2007)  Chol:203 (05/29/2009), 210 (05/24/2008)  Trig:59.0 (05/29/2009), 84 (05/24/2008)  Problem # 3:  HYPERTENSION  (ICD-401.9) Assessment: Improved  Her updated medication list for this problem includes:    Metoprolol Tartrate 25 Mg Tabs (Metoprolol tartrate) .Marland Kitchen... 1t twice a day  BP today: 148/60 Prior BP: 130/72 (11/01/2009)  Labs Reviewed: K+: 4.4 (05/29/2009) Creat: : 0.7 (05/29/2009)   Chol: 203 (05/29/2009)   HDL: 78.50 (05/29/2009)   LDL: DEL (05/24/2008)   TG: 59.0 (05/29/2009)  Complete Medication List: 1)  Vitamin D 400 Unit Tabs (Cholecalciferol) .... Take 1 tab once daily 2)  Ciprofloxacin Hcl 250 Mg Tabs (Ciprofloxacin hcl) .Marland Kitchen.. 1 tablet by mouth twice a day 3)  Aspirin 325 Mg Tabs (Aspirin) .Marland Kitchen.. 1t;qd 4)  Crestor 20 Mg Tabs (Rosuvastatin calcium) .Marland Kitchen.. 1t by mouth every day 5)  Metoprolol Tartrate 25 Mg Tabs (Metoprolol tartrate) .Marland Kitchen.. 1t twice a day  Patient Instructions: 1)  rto 3 months or sooner as needed

## 2010-11-06 NOTE — Medication Information (Signed)
Summary: Rx Info for Metoprolol  Rx Info for Metoprolol   Imported By: Lanelle Bal 08/06/2010 09:34:57  _____________________________________________________________________  External Attachment:    Type:   Image     Comment:   External Document

## 2010-11-06 NOTE — Assessment & Plan Note (Signed)
Summary: 3 MONTH OV//PH   Vital Signs:  Patient profile:   77 year old female Weight:      141 pounds Pulse rate:   70 / minute Pulse rhythm:   43 BP sitting:   124 / 72  (left arm) Cuff size:   regular  Vitals Entered By: Army Fossa CMA (Feb 06, 2010 9:28 AM) CC: Pt here for 3 month OV- doing well.   History of Present Illness:  Hypertension follow-up      This is an 77 year old woman who presents for Hypertension follow-up.  Pt states her walking has changed because after about 20 min her L leg begins to feel heavy so she needed to change her route. No cp or sob.  Pt states she is not afraid to fall.  The patient denies lightheadedness, urinary frequency, headaches, edema, impotence, rash, and fatigue.  The patient denies the following associated symptoms: chest pain, chest pressure, exercise intolerance, dyspnea, palpitations, syncope, leg edema, and pedal edema.  Compliance with medications (by patient report) has been near 100%.  The patient reports that dietary compliance has been good.  The patient reports exercising 3-4X per week.  Adjunctive measures currently used by the patient include salt restriction.    Hyperlipidemia follow-up      The patient also presents for Hyperlipidemia follow-up.  The patient denies muscle aches, GI upset, abdominal pain, flushing, itching, constipation, diarrhea, and fatigue.  The patient denies the following symptoms: chest pain/pressure, exercise intolerance, dypsnea, palpitations, syncope, and pedal edema.  Compliance with medications (by patient report) has been near 100%.  Dietary compliance has been good.  The patient reports exercising 3-4X per week.  Adjunctive measures currently used by the patient include ASA.    Current Medications (verified): 1)  Vitamin D3 1000 Unit Caps (Cholecalciferol) .Marland Kitchen.. 1 By Mouth Once Daily 2)  Aspirin 325 Mg Tabs (Aspirin) .Marland Kitchen.. 1 By Mouth Once Daily 3)  Crestor 20 Mg Tabs (Rosuvastatin Calcium) .Marland Kitchen.. 1 Tab  By Mouth Every Day. Needs Labwork. 4)  Metoprolol Tartrate 25 Mg Tabs (Metoprolol Tartrate) .Marland Kitchen.. 1 Tab By Mouth Twice A Day. Needs Ov. 5)  Caltrate 600+d Plus 600-400 Mg-Unit Tabs (Calcium Carbonate-Vit D-Min) .Marland Kitchen.. 1 By Mouth Two Times A Day 6)  Fish Oil 1000 Mg Caps (Omega-3 Fatty Acids) .Marland Kitchen.. 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  Past Medical History: Reviewed history from 11/09/2009 and no changes required. Hypertension Osteoarthritis Osteoporosis Cerebrovascular accident, hx of (11/01/2009)  Past Surgical History: Reviewed history from 02/23/2007 and no changes required. Hysterectomy RIGHT LEG VEIN LIGATION Cataract extraction-LEFT  Family History: Reviewed history from 06/06/2008 and no changes required. Family History of CAD Female 1st degree relative 75yo M--- died 2002-02-20 b/l hip fx  Social History: Reviewed history from 03/10/2007 and no changes required. Retired [employer] of [place], [place], [place] Divorced Never Smoked Alcohol use-no Drug use-no Regular exercise-yes  Review of Systems      See  HPI  Physical Exam  General:  Well-developed,well-nourished,in no acute distress; alert,appropriate and cooperative throughout examination Neck:  No deformities, masses, or tenderness noted. Lungs:  Normal respiratory effort, chest expands symmetrically. Lungs are clear  to auscultation, no crackles or wheezes. Heart:  normal rate and no murmur.   Extremities:  No clubbing, cyanosis, edema, or deformity noted with normal full range of motion of all joints.   + blister between 3rd and 4th toes R foot no calf pain Cervical Nodes:  No lymphadenopathy noted Psych:  Oriented X3 and normally interactive.     Impression & Recommendations:  Problem # 1:  HYPERLIPIDEMIA (ICD-272.4)  Her updated medication list for this problem includes:    Crestor 20 Mg Tabs (Rosuvastatin calcium) .Marland Kitchen... 1 tab by mouth every day. needs labwork.  Orders: Venipuncture (14782) TLB-BMP (Basic Metabolic Panel-BMET) (80048-METABOL) TLB-CBC Platelet - w/Differential (85025-CBCD) TLB-Hepatic/Liver Function Pnl (80076-HEPATIC) T-NMR, Lipoprofile ([id number]) T-Vitamin D (25-Hydroxy) [phone])  Labs Reviewed: SGOT: 25 (05/29/2009)   SGPT: 22 (05/29/2009)   HDL:78.50 (05/29/2009), 62.0 (05/24/2008)  LDL:DEL (05/24/2008), DEL (03/10/2007)  Chol:203 (05/29/2009), 210 (05/24/2008)  Trig:59.0 (05/29/2009), 84 (05/24/2008)  Problem # 2:  INTERMITTENT CLAUDICATION, LEFT LEG (ICD-443.9)  Orders: LE Arterial Doppler/ABI (Le arterial doppler)  Problem # 3:  OSTEOPOROSIS (ICD-733.00)  Her updated medication list for this problem includes:    Vitamin D3 1000 Unit Caps (Cholecalciferol) .Marland Kitchen... 1 by mouth once daily    Caltrate 600+d Plus 600-400 Mg-unit Tabs (Calcium carbonate-vit d-min) .Marland Kitchen... 1 by mouth two times a day  Orders: Venipuncture (84132) TLB-BMP (Basic Metabolic Panel-BMET) (80048-METABOL) TLB-CBC Platelet - w/Differential (85025-CBCD) TLB-Hepatic/Liver Function Pnl (80076-HEPATIC) T-NMR, Lipoprofile ([id number]) T-Vitamin D (25-Hydroxy) (66440-34742)  Bone Density: normal (03/23/2008) Vit D:56 (09/07/2009), 58 (05/29/2009)  Problem # 4:  HYPERTENSION (ICD-401.9)  Her updated medication list for this problem includes:    Metoprolol Tartrate 25 Mg Tabs (Metoprolol  tartrate) .Marland Kitchen... 1 tab by mouth twice a day. needs ov.  Orders: Venipuncture (59563) TLB-BMP (Basic Metabolic Panel-BMET) (80048-METABOL) TLB-CBC Platelet - w/Differential (85025-CBCD) TLB-Hepatic/Liver Function Pnl (80076-HEPATIC) T-NMR, Lipoprofile ([id number]) T-Vitamin D (25-Hydroxy) (18841-66063)  BP today: 124/72 Prior BP: 148/60 (11/09/2009)  Labs Reviewed: K+: 4.4 (05/29/2009) Creat: : 0.7 (05/29/2009)   Chol: 203 (05/29/2009)   HDL: 78.50 (05/29/2009)   LDL: DEL (05/24/2008)   TG: 59.0 (05/29/2009)  Complete Medication List: 1)  Vitamin D3 1000 Unit Caps (Cholecalciferol) .Marland Kitchen.. 1 by mouth once daily 2)  Aspirin 325 Mg Tabs (Aspirin) .Marland Kitchen.. 1 by mouth once daily 3)  Crestor 20 Mg Tabs (Rosuvastatin calcium) .Marland Kitchen.. 1 tab by mouth every day. needs labwork. 4)  Metoprolol Tartrate 25 Mg Tabs (Metoprolol tartrate) .Marland Kitchen.. 1 tab by mouth twice a day. needs ov. 5)  Caltrate 600+d Plus 600-400 Mg-unit Tabs (Calcium carbonate-vit d-min) .Marland Kitchen.. 1 by mouth two times a day 6)  Fish Oil 1000 Mg Caps (Omega-3 fatty acids) .Marland Kitchen.. 1 by mouth once daily  Patient Instructions: 1)  Please schedule a follow-up appointment in 6 months--cpe      Last Flu Vaccine:  Fluvax 3+ (08/11/2009 11:43:49 AM) Flu Vaccine Result Date:  08/11/2009 Flu Vaccine Result:  given Flu Vaccine Next Due:  1 yr

## 2010-11-06 NOTE — Assessment & Plan Note (Signed)
Summary: left leg pain/drb   Vital Signs:  Patient profile:   77 year old female Height:      57.75 inches Weight:      146 pounds BMI:     30.89 Pulse rate:   84 / minute BP sitting:   130 / 72  (right arm)  Vitals Entered By: Doristine Devoid (November 01, 2009 4:03 PM) CC: L leg weakness xthis morning denies any pain    Primary Care Provider:  Laury Axon  CC:  L leg weakness xthis morning denies any pain .  History of Present Illness: Ms Camilo is an 77 year old female who presents today with c/o left leg weakness.  Notes that when she got out of bed at 5 AM to use bathroom and noted that  her left leg "gave way".  Initially thought that this may be due to leg "falling asleep".  Patient went back to sleep and at 7PM symptoms were unchanged.   Denies associated left arm numbness or weakness.  Notes sensation of heaviness in the left leg but no real numbness.  Patient has history of low back pain, but denies ever having symptoms of radiculopathy.    Allergies: No Known Drug Allergies  Review of Systems       Denies headache,  denies associated slurred speech or facial droop.  Denies associated word finding difficulty.  Notes that she is dragging her left leg  Physical Exam  General:  Well-developed,well-nourished,in no acute distress; alert,appropriate and cooperative throughout examination Head:  Normocephalic and atraumatic without obvious abnormalities. No apparent alopecia or balding. Eyes:  PERRLA Ears:  External ear exam shows no significant lesions or deformities.  Otoscopic examination reveals clear canals, tympanic membranes are intact bilaterally without bulging, retraction, inflammation or discharge. Hearing is grossly normal bilaterally. Neck:  negative carotid bruit bilaterally Lungs:  Normal respiratory effort, chest expands symmetrically. Lungs are clear to auscultation, no crackles or wheezes. Heart:  Normal rate and regular rhythm. S1 and S2 normal without gallop,  murmur, click, rub or other extra sounds. Abdomen:  Bowel sounds positive,abdomen soft and non-tender without masses, organomegaly or hernias noted. Neurologic:  gait- patient is dragging left leg. LLE 4-5/5 RLE 5/5  +facial symmetry.  Speech is clearalert & oriented X3, cranial nerves II-XII intact, and DTRs symmetrical and normal.     Impression & Recommendations:  Problem # 1:  WEAKNESS (ICD-780.79) Assessment New Patient with new LLE weakness- symptoms started approximately 11.5 hours ago.   Will refer to [facility] for direct admission for stroke work up.   Patient's risk factors include Hyperlipidemia and HTN.  I spoke with Dr. David Stall and he has agreed to accept the patient for direct admission to [facility].    Complete Medication List: 1)  Vitamin D 400 Unit Tabs (Cholecalciferol) .... Take 1 tab once daily  Patient Instructions: 1)  Please go directely to [facility] Admitting department. 2)  Have a friend or family member drive you there.  3)  You are being admitted to the [facility] for further evaluation and work up to make sure that you have not had a stroke.

## 2010-11-06 NOTE — Progress Notes (Signed)
Summary: Hosp Follow up  Phone Note Outgoing Call   Call placed by: Army Fossa CMA,  November 07, 2009 10:11 AM Summary of Call: Hosp Follow up, [facility]:  needs appointment for f/u this week Signed by Loreen Freud DO on 11/04/2009 at 4:37 PM   Follow-up for Phone Call        pt. states she has a apppointment  this week with Dr.Lowne.Michaelle Copas  November 07, 2009 4:47 PM

## 2010-11-06 NOTE — Assessment & Plan Note (Signed)
Summary: Review [facility]//KP   Vital Signs:  Patient profile:   77 year old female Menstrual status:  hysterectomy Weight:      144 pounds Pulse rate:   72 / minute Pulse rhythm:   regular BP sitting:   118 / 68  (left arm) Cuff size:   regular  Vitals Entered By: Almeta Monas CMA Duncan Dull) (September 07, 2010 10:39 AM) CC: Review [facility]   History of Present Illness: Pt here to review [facility].    Problems Prior to Update: 1)  Lacunar Infarction  (ICD-434.91) 2)  Myalgia  (ICD-729.1) 3)  Intermittent Claudication, Left Leg  (ICD-443.9) 4)  Cerebrovascular Accident, Hx of  (ICD-V12.50) 5)  Weakness  (ICD-780.79) 6)  Hyperlipidemia  (ICD-272.4) 7)  Leg Pain, Left  (ICD-729.5) 8)  Uti  (ICD-599.0) 9)  Hip Pain, Left  (ICD-719.45) 10)  Low Back Pain, Mild  (ICD-724.2) 11)  Preventive Health Care  (ICD-V70.0) 12)  Vertigo, Benign Paroxysmal Position  (ICD-386.11) 13)  Family History of Cad Female 1st Degree Relative <50  (ICD-V17.3) 14)  Colonoscopy, Hx of  (ICD-V12.79) 15)  Compression Fracture  (ICD-829.0) 16)  Osteoporosis  (ICD-733.00) 17)  Osteoarthritis  (ICD-715.90) 18)  Hypertension  (ICD-401.9)  Medications Prior to Update: 1)  Vitamin D3 1000 Unit Caps (Cholecalciferol) .Marland Kitchen.. 1 By Mouth Once Daily 2)  Aspirin 325 Mg Tabs (Aspirin) .Marland Kitchen.. 1 By Mouth Once Daily 3)  Metoprolol Tartrate 25 Mg Tabs (Metoprolol Tartrate) .Marland Kitchen.. 1 Tab By Mouth Twice A Day. Needs Ov. 4)  Caltrate 600+d Plus 600-400 Mg-Unit Tabs (Calcium Carbonate-Vit D-Min) .Marland Kitchen.. 1 By Mouth Two Times A Day 5)  Fish Oil 1000 Mg Caps (Omega-3 Fatty Acids) .Marland Kitchen.. 1 By Mouth Once Daily  Current Medications (verified): 1)  Vitamin D3 1000 Unit Caps (Cholecalciferol) .Marland Kitchen.. 1 By Mouth Once Daily 2)  Aspirin 325 Mg Tabs (Aspirin) .Marland Kitchen.. 1 By Mouth Once Daily 3)  Metoprolol Tartrate 25 Mg Tabs (Metoprolol Tartrate) .Marland Kitchen.. 1 Tab By Mouth Twice A Day. Needs Ov. 4)  Caltrate 600+d Plus 600-400 Mg-Unit  Tabs (Calcium Carbonate-Vit D-Min) .Marland Kitchen.. 1 By Mouth Two Times A Day 5)  Fish Oil 1000 Mg Caps (Omega-3 Fatty Acids) .Marland Kitchen.. 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 Family History of CAD Female 1st degree relative 75yo M--- died 03/05/02 b/l hip fx  Social History: Reviewed history from 03/10/2007 and no changes required. Retired [employer] of [place], [place], [place] Divorced Never Smoked Alcohol use-no Drug use-no Regular exercise-yes  Review of Systems      See HPI  Physical Exam  General:  Well-developed,well-nourished,in no acute distress; alert,appropriate and cooperative throughout examination Psych:  Oriented X3 and normally interactive.     Impression & Recommendations:  Problem # 1:  CEREBROVASCULAR ACCIDENT, HX OF (ICD-V12.50)  Problem # 2:  HYPERLIPIDEMIA (  ICD-272.4) Assessment: Improved [facility] discussed scanned in  repeat 1 year  Complete Medication List: 1)  Vitamin D3 1000 Unit Caps (Cholecalciferol) .Marland Kitchen.. 1 by mouth once daily 2)  Aspirin 325 Mg Tabs (Aspirin) .Marland Kitchen.. 1 by mouth once daily 3)  Metoprolol Tartrate 25 Mg Tabs (Metoprolol tartrate) .Marland Kitchen.. 1 tab by mouth  twice a day. needs ov. 4)  Caltrate 600+d Plus 600-400 Mg-unit Tabs (Calcium carbonate-vit d-min) .Marland Kitchen.. 1 by mouth two times a day 5)  Fish Oil 1000 Mg Caps (Omega-3 fatty acids) .Marland Kitchen.. 1 by mouth once daily   Orders Added: 1)  Est. Patient Level III [04540]

## 2010-11-06 NOTE — Progress Notes (Signed)
Summary: heaviness in leg no better  Phone Note Call from Patient   Caller: Patient Summary of Call: pt left VM that the heaviness in leg is still there. pt would like to know what is the next step now. pls advise...................Marland KitchenFelecia Deloach CMA  May 25, 2010 9:34 AM   Follow-up for Phone Call        refer to neuro-- who did she see when she had her stroke?  I could not tell from hosp d/c Follow-up by: Loreen Freud DO,  May 25, 2010 12:53 PM  Additional Follow-up for Phone Call Additional follow up Details #1::        Left message for pt to call back. Army Fossa CMA  May 25, 2010 4:16 PM     Additional Follow-up for Phone Call Additional follow up Details #2::    PT IS UNSURE WHO SHE SAW @ NEURO. PT AWARE REFERRAL PUT ...............Marland KitchenFelecia Deloach CMA  May 28, 2010 10:59 AM

## 2010-11-06 NOTE — Assessment & Plan Note (Signed)
Summary: FLU SHOT///SPJ   Nurse Visit   Allergies: No Known Drug Allergies  Orders Added: 1)  Flu Vaccine 2yrs + MEDICARE PATIENTS [Q2039] 2)  Administration Flu vaccine - MCR [G0008] Flu Vaccine Consent Questions     Do you have a history of severe allergic reactions to this vaccine? no    Any prior history of allergic reactions to egg and/or gelatin? no    Do you have a sensitivity to the preservative Thimersol? no    Do you have a past history of Guillan-Barre Syndrome? no    Do you currently have an acute febrile illness? no    Have you ever had a severe reaction to latex? no    Vaccine information given and explained to patient? yes    Are you currently pregnant? no    Lot Number:[id number]   Exp Date:04/06/2011   Manufacturer: [employer]    Site Given  Left Deltoid [email or web address]

## 2010-11-06 NOTE — Miscellaneous (Signed)
Summary: PT Initial Summary/[facility]  PT Initial Summary/[facility]   Imported By: Lanelle Bal 11/15/2009 12:44:06  _____________________________________________________________________  External Attachment:    Type:   Image     Comment:   External Document

## 2010-11-06 NOTE — Letter (Signed)
Summary: Encounter Notice/[facility]  Encounter Notice/[facility]   Imported By: Lanelle Bal 11/13/2009 08:24:38  _____________________________________________________________________  External Attachment:    Type:   Image     Comment:   External Document

## 2010-11-06 NOTE — Progress Notes (Signed)
Summary: leg heaviness/labs  Phone Note Call from Patient Call back at Home Phone [phone]   Summary of Call: Patient called the office stating that her left leg which was affected by the mini stroke in Jan 2011 feels like "a 150 lb weight". Patient has done physical therapy and things have not gotten better.   Patient is aware the appt for sept has been cx, and she is requesting her CPX be done this summer also. Patient is also aware that she needs labs drawn. Patient is afraid of having another mini stroke and is wondering if the MD has any suggestions or if she should be seen.  Please advise. Initial call taken by: Lucious Groves CMA,  May 08, 2010 4:16 PM  Follow-up for Phone Call        pt needs to be seen Follow-up by: Loreen Freud DO,  May 08, 2010 4:29 PM  Additional Follow-up for Phone Call Additional follow up Details #1::        Patient notified and schedule for tomorrow. Additional Follow-up by: Lucious Groves CMA,  May 09, 2010 12:27 PM

## 2010-11-06 NOTE — Progress Notes (Signed)
  Phone Note Call from Patient   Summary of Call: Pt brought in forms- needs Refills on Crestor and Metoprolol sent to [facility].     New/Updated Medications: CRESTOR 20 MG TABS (ROSUVASTATIN CALCIUM) 1 tab by mouth every day. NEEDS LABWORK. METOPROLOL TARTRATE 25 MG TABS (METOPROLOL TARTRATE) 1 tab by mouth twice a day. NEEDS OV. Prescriptions: METOPROLOL TARTRATE 25 MG TABS (METOPROLOL TARTRATE) 1 tab by mouth twice a day. NEEDS OV.  #180 x 0   Entered by:   Army Fossa CMA   Authorized by:   Loreen Freud DO   Signed by:   Army Fossa CMA on 01/11/2010   Method used:   Electronically to        [facility]* (mail-order)             ,          Ph: [phone]       Fax: [phone]   RxID:   [id number] CRESTOR 20 MG TABS (ROSUVASTATIN CALCIUM) 1 tab by mouth every day. NEEDS LABWORK.  #90 x 0   Entered by:   Army Fossa CMA   Authorized by:   Loreen Freud DO   Signed by:   Army Fossa CMA on 01/11/2010   Method used:   Electronically to        [facility]* (mail-order)             ,          Ph: [phone]       Fax: [phone]   RxID:   [id number]

## 2010-11-08 NOTE — Progress Notes (Signed)
Summary: Gabriela Patel ok to start statin  Phone Note Call from Patient Call back at [phone] ext [phone]   Caller: sandy (Dr Axel Filler) [facility] Summary of Call: Pt seen on yesterday at [facility], OV notes faxed to our office. Nurse calling to inform dr Laury Axon per Dr Axel Filler that it is ok to start pt on statin if she would like to since Pt had some elevated cholesterol. Pt total cholesterol 221 on 08-09-10..........Marland KitchenFelecia Deloach CMA  September 19, 2010 10:42 AM   Follow-up for Phone Call        labs sent to be scanned in---I need to wait until they are in system-- she was here on the 2nd.  They should be in here already and they are not----Please check on them Follow-up by: Loreen Freud DO,  September 20, 2010 9:28 AM  Additional Follow-up for Phone Call Additional follow up Details #1::        BHl back.Marland KitchenMarland KitchenPer Dr.Lowne pt can start Lipitor 10 mg 1 by mouth at bedtime recheck labs in 3 mos. Pt voiced  understanding, request RX [facility] [address]...Marland KitchenMarland KitchenMarland Kitchen Additional Follow-up by: Almeta Monas CMA Duncan Dull),  September 20, 2010 5:03 PM    New/Updated Medications: LIPITOR 10 MG TABS (ATORVASTATIN CALCIUM) 1 by mouth at bedtime Prescriptions: LIPITOR 10 MG TABS (ATORVASTATIN CALCIUM) 1 by mouth at bedtime  #30 x 2   Entered by:   Almeta Monas CMA (AAMA)   Authorized by:   Loreen Freud DO   Signed by:   Almeta Monas CMA (AAMA) on 09/20/2010   Method used:   Faxed to ...       [id number] (retail)       [address]       [place], [place]  [address]       Ph: [phone] or [phone]       Fax: [phone]   RxID:   [id number]

## 2010-11-08 NOTE — Letter (Signed)
Summary: [facility]  [facility]   Imported By: Lanelle Bal 09/27/2010 11:43:06  _____________________________________________________________________  External Attachment:    Type:   Image     Comment:   External Document

## 2010-11-20 ENCOUNTER — Other Ambulatory Visit: Payer: Self-pay | Admitting: Family Medicine

## 2010-11-20 ENCOUNTER — Ambulatory Visit (INDEPENDENT_AMBULATORY_CARE_PROVIDER_SITE_OTHER): Payer: Medicare Other | Admitting: Family Medicine

## 2010-11-20 ENCOUNTER — Encounter: Payer: Self-pay | Admitting: Family Medicine

## 2010-11-20 DIAGNOSIS — N390 Urinary tract infection, site not specified: Secondary | ICD-10-CM

## 2010-11-20 DIAGNOSIS — R5383 Other fatigue: Secondary | ICD-10-CM

## 2010-11-20 DIAGNOSIS — E785 Hyperlipidemia, unspecified: Secondary | ICD-10-CM

## 2010-11-20 DIAGNOSIS — I10 Essential (primary) hypertension: Secondary | ICD-10-CM

## 2010-11-20 DIAGNOSIS — R5381 Other malaise: Secondary | ICD-10-CM

## 2010-11-20 DIAGNOSIS — E538 Deficiency of other specified B group vitamins: Secondary | ICD-10-CM

## 2010-11-20 LAB — CBC WITH DIFFERENTIAL/PLATELET
Basophils Absolute: 0.1 10*3/uL (ref 0.0–0.1)
Eosinophils Absolute: 0.2 10*3/uL (ref 0.0–0.7)
HCT: 40.1 % (ref 36.0–46.0)
Hemoglobin: 13.6 g/dL (ref 12.0–15.0)
Lymphs Abs: 1.4 10*3/uL (ref 0.7–4.0)
MCHC: 33.8 g/dL (ref 30.0–36.0)
Monocytes Relative: 7.2 % (ref 3.0–12.0)
Neutro Abs: 7 10*3/uL (ref 1.4–7.7)
RDW: 13.8 % (ref 11.5–14.6)

## 2010-11-20 LAB — BASIC METABOLIC PANEL
CO2: 28 mEq/L (ref 19–32)
Calcium: 9.5 mg/dL (ref 8.4–10.5)
Creatinine, Ser: 0.7 mg/dL (ref 0.4–1.2)

## 2010-11-20 LAB — CK: Total CK: 122 U/L (ref 7–177)

## 2010-11-21 ENCOUNTER — Encounter: Payer: Self-pay | Admitting: Family Medicine

## 2010-11-28 NOTE — Assessment & Plan Note (Signed)
Summary: fatigue, possible reaction to med   Vital Signs:  Patient profile:   77 year old female Menstrual status:  hysterectomy Weight:      145.4 pounds Temp:     98.1 degrees F oral BP sitting:   124 / 78  (right arm) Cuff size:   regular  Vitals Entered By: Almeta Monas CMA Duncan Dull) (November 20, 2010 10:11 AM) CC: Med f/u----c/o feeling fatigue--no illness   History of Present Illness:  Hypertension follow-up      This is an 77 year old woman who presents for Hypertension follow-up.  The patient complains of fatigue, but denies lightheadedness, urinary frequency, headaches, edema, impotence, and rash.  The patient denies the following associated symptoms: chest pain, chest pressure, exercise intolerance, dyspnea, palpitations, syncope, leg edema, and pedal edema.  Compliance with medications (by patient report) has been near 100%.  The patient reports that dietary compliance has been good.  The patient reports no exercise.  Adjunctive measures currently used by the patient include salt restriction.    Hyperlipidemia follow-up      The patient also presents for Hyperlipidemia follow-up.  The patient complains of fatigue, but denies muscle aches, GI upset, abdominal pain, flushing, itching, constipation, and diarrhea.  The patient denies the following symptoms: chest pain/pressure, exercise intolerance, dypsnea, palpitations, syncope, and pedal edema.  Compliance with medications (by patient report) has been near 100%.  Dietary compliance has been good.  The patient reports no exercise.  Adjunctive measures currently used by the patient include ASA.    Current Medications (verified): 1)  Vitamin D3 1000 Unit Caps (Cholecalciferol) .Marland Kitchen.. 1 By Mouth Once Daily 2)  Aspirin 325 Mg Tabs (Aspirin) .Marland Kitchen.. 1 By Mouth Once Daily 3)  Metoprolol Tartrate 25 Mg Tabs (Metoprolol Tartrate) .Marland Kitchen.. 1 Tab By Mouth Twice A Day. Needs Ov. 4)  Caltrate 600+d Plus 600-400 Mg-Unit Tabs (Calcium Carbonate-Vit  D-Min) .Marland Kitchen.. 1 By Mouth Two Times A Day 5)  Fish Oil 1000 Mg Caps (Omega-3 Fatty Acids) .Marland Kitchen.. 1 By Mouth Once Daily 6)  Lipitor 10 Mg Tabs (Atorvastatin Calcium) .Marland Kitchen.. 1 By Mouth Every Other Day At Bedtime  Allergies (verified): No Known Drug Allergies  Past History:  Past Medical History: Last updated: 11/09/2009 Hypertension Osteoarthritis Osteoporosis Cerebrovascular accident, hx of (11/01/2009)  Past Surgical History: Last updated: 02/23/2007 Hysterectomy RIGHT LEG VEIN LIGATION Cataract extraction-LEFT  Family History: Last updated: 06/06/2008 Family History of CAD Female 1st degree relative 75yo M--- died 03/10/02 b/l hip fx  Social History: Last updated: 03/10/2007 Retired March 10, 1994 [employer] of [place], [place], [place] Divorced Never Smoked Alcohol use-no Drug use-no Regular exercise-yes  Risk Factors: Alcohol Use: 0 (08/09/2010) Caffeine Use: 1 (08/09/2010) Exercise: yes (08/09/2010)  Risk Factors: Smoking Status: never (08/09/2010)  Family History: Reviewed history from 06/06/2008 and no changes required. Family History of CAD Female 1st degree relative 75yo M--- died Mar 10, 2002 b/l hip fx  Social History: Reviewed history from 03/10/2007 and no changes required. Retired [employer] [place], [place], [place] Divorced Never Smoked Alcohol use-no Drug use-no Regular exercise-yes  Review of Systems      See HPI  Physical Exam  General:  Well-developed,well-nourished,in no acute distress; alert,appropriate and cooperative throughout examination Neck:  No deformities, masses, or tenderness noted. Lungs:  Normal respiratory effort, chest expands symmetrically. Lungs are clear to auscultation, no crackles or wheezes. Heart:  normal rate and no murmur.   Extremities:  No clubbing, cyanosis, edema, or deformity noted with normal full range of motion of all  joints.   Psych:  Oriented X3 and normally interactive.     Impression & Recommendations:  Problem # 1:  HYPERLIPIDEMIA  (ICD-272.4)  Her updated medication list for this problem includes:    Lipitor 10 Mg Tabs (Atorvastatin calcium) .Marland Kitchen... 1 by mouth every other day at bedtime  Labs Reviewed: SGOT: 30 (08/09/2010)   SGPT: 23 (08/09/2010)   HDL:79.60 (08/09/2010), 78.50 (05/29/2009)  LDL:DEL (05/24/2008), DEL (03/10/2007)  Chol:221 (08/09/2010), 203 (05/29/2009)  Trig:82.0 (08/09/2010), 59.0 (05/29/2009)  Orders: Prescription Created Electronically [phone])  Problem # 2:  HYPERTENSION (ICD-401.9)  Her updated medication list for this problem includes:    Metoprolol Tartrate 25 Mg Tabs (Metoprolol tartrate) .Marland Kitchen... 1 tab by mouth twice a day. needs ov.  BP today: 124/78 Prior BP: 118/68 (09/07/2010)  Labs Reviewed: K+: 4.5 (08/09/2010) Creat: : 0.7 (08/09/2010)   Chol: 221 (08/09/2010)   HDL: 79.60 (08/09/2010)   LDL: DEL (05/24/2008)   TG: 82.0 (08/09/2010)  Orders: Prescription Created Electronically [phone])  Problem # 3:  FATIGUE (ICD-780.79) decrease lipitor to every other day Orders: Venipuncture (09811) TLB-B12 + Folate Pnl ([id number]/QMV) TLB-CBC Platelet - w/Differential (85025-CBCD) TLB-BMP (Basic Metabolic Panel-BMET) (80048-METABOL) TLB-TSH (Thyroid Stimulating Hormone) (84443-TSH) TLB-CK Total Only(Creatine Kinase/CPK) (82550-CK) Prescription Created Electronically [phone])  Complete Medication List: 1)  Vitamin D3 1000 Unit Caps (Cholecalciferol) .Marland Kitchen.. 1 by mouth once daily 2)  Aspirin 325 Mg Tabs (Aspirin) .Marland Kitchen.. 1 by mouth once daily 3)  Metoprolol Tartrate 25 Mg Tabs (Metoprolol tartrate) .Marland Kitchen.. 1 tab by mouth twice a day. needs ov. 4)  Caltrate 600+d Plus 600-400 Mg-unit Tabs (Calcium carbonate-vit d-min) .Marland Kitchen.. 1 by mouth two times a day 5)  Fish Oil 1000 Mg Caps (Omega-3 fatty acids) .Marland Kitchen.. 1 by mouth once daily 6)  Lipitor 10 Mg Tabs (Atorvastatin calcium) .Marland Kitchen.. 1 by mouth every other day at bedtime  Patient Instructions: 1)  decrease the Liptor to 10 mg every other day at  bedtime 2)  recheck labs in April ----272.4  lipid, hep, bmp Prescriptions: LIPITOR 10 MG TABS (ATORVASTATIN CALCIUM) 1 by mouth every other day at bedtime  #15 x 2   Entered and Authorized by:   Loreen Freud DO   Signed by:   Loreen Freud DO on 11/20/2010   Method used:   Electronically to        [id number] ([facility]       [address]       [place], [place]  [address]       Ph: [phone] or [phone]       Fax: [phone]   RxID:   [id number]    Orders Added: 1)  Venipuncture [60630] 2)  TLB-B12 + Folate Pnl [[id number]-B12/FOL] 3)  TLB-CBC Platelet - w/Differential [85025-CBCD] 4)  TLB-BMP (Basic Metabolic Panel-BMET) [80048-METABOL] 5)  TLB-TSH (Thyroid Stimulating Hormone) [84443-TSH] 6)  TLB-CK Total Only(Creatine Kinase/CPK) [82550-CK] 7)  Est. Patient Level III [[address]] 8)  Prescription Created Electronically [phone]  Appended Document: fatigue, possible reaction to med   Appended Document: fatigue, possible reaction to med  Laboratory Results   Urine Tests   Date/Time Reported: November 20, 2010 11:46 AM   Routine Urinalysis   Color: yellow Appearance: Hazy Glucose: negative   (Normal Range: Negative) Bilirubin: negative   (Normal Range: Negative) Ketone: negative   (Normal Range: Negative) Spec. Gravity: <1.005   (Normal Range: 1.003-1.035) Blood: trace-lysed   (Normal Range: Negative) pH: 5.0   (Normal Range: 5.0-8.0) Protein: negative   (Normal Range: Negative) Urobilinogen:  negative   (Normal Range: 0-1) Nitrite: positive   (Normal Range: Negative) Leukocyte Esterace: large   (Normal Range: Negative)    Comments: Floydene Flock  November 20, 2010 11:46 AM cx sent

## 2010-12-24 LAB — CARDIAC PANEL(CRET KIN+CKTOT+MB+TROPI)
Relative Index: 2.8 — ABNORMAL HIGH (ref 0.0–2.5)
Troponin I: 0.02 ng/mL (ref 0.00–0.06)

## 2010-12-24 LAB — COMPREHENSIVE METABOLIC PANEL
AST: 28 U/L (ref 0–37)
BUN: 7 mg/dL (ref 6–23)
CO2: 26 mEq/L (ref 19–32)
Calcium: 9.2 mg/dL (ref 8.4–10.5)
Chloride: 99 mEq/L (ref 96–112)
Creatinine, Ser: 0.68 mg/dL (ref 0.4–1.2)
GFR calc Af Amer: >60 mL/min (ref >60)
GFR calc non Af Amer: >60 mL/min (ref >60)
Glucose, Bld: 122 mg/dL — ABNORMAL HIGH (ref 70–99)
Total Bilirubin: 0.6 mg/dL (ref 0.3–1.2)

## 2010-12-24 LAB — CBC
HCT: 38.5 % (ref 36.0–46.0)
Hemoglobin: 13.1 g/dL (ref 12.0–15.0)
MCHC: 34.1 g/dL (ref 30.0–36.0)
MCV: 94.3 fL (ref 78.0–100.0)
RBC: 4.08 MIL/uL (ref 3.87–5.11)
WBC: 10.3 10*3/uL (ref 4.0–10.5)

## 2010-12-24 LAB — FOLATE: Folate: >20 ng/mL

## 2010-12-24 LAB — URINALYSIS, MICROSCOPIC ONLY
Glucose, UA: NEGATIVE mg/dL
Ketones, ur: NEGATIVE mg/dL
Nitrite: NEGATIVE
Protein, ur: NEGATIVE mg/dL
pH: 8 (ref 5.0–8.0)

## 2010-12-24 LAB — BASIC METABOLIC PANEL
BUN: 8 mg/dL (ref 6–23)
Chloride: 105 mEq/L (ref 96–112)
Creatinine, Ser: 0.77 mg/dL (ref 0.4–1.2)
Glucose, Bld: 95 mg/dL (ref 70–99)
Potassium: 4.1 mEq/L (ref 3.5–5.1)

## 2010-12-24 LAB — SEDIMENTATION RATE: Sed Rate: 37 mm/hr — ABNORMAL HIGH (ref 0–22)

## 2010-12-24 LAB — PROTIME-INR
INR: 1 (ref 0.00–1.49)
Prothrombin Time: 13.1 seconds (ref 11.6–15.2)

## 2010-12-24 LAB — LIPID PANEL
HDL: 85 mg/dL (ref >39)
VLDL: 13 mg/dL (ref 0–40)

## 2010-12-24 LAB — ANA: Anti Nuclear Antibody(ANA): NEGATIVE

## 2011-01-21 ENCOUNTER — Other Ambulatory Visit (INDEPENDENT_AMBULATORY_CARE_PROVIDER_SITE_OTHER): Payer: Medicare Other

## 2011-01-21 ENCOUNTER — Other Ambulatory Visit: Payer: Medicare Other

## 2011-01-21 DIAGNOSIS — E785 Hyperlipidemia, unspecified: Secondary | ICD-10-CM

## 2011-01-21 LAB — BASIC METABOLIC PANEL
BUN: 9 mg/dL (ref 6–23)
CO2: 30 mEq/L (ref 19–32)
Chloride: 101 mEq/L (ref 96–112)
Glucose, Bld: 102 mg/dL — ABNORMAL HIGH (ref 70–99)
Potassium: 4.9 mEq/L (ref 3.5–5.1)

## 2011-01-21 LAB — HEPATIC FUNCTION PANEL
Alkaline Phosphatase: 60 U/L (ref 39–117)
Bilirubin, Direct: 0.1 mg/dL (ref 0.0–0.3)
Total Bilirubin: 0.4 mg/dL (ref 0.3–1.2)

## 2011-01-21 LAB — LIPID PANEL
LDL Cholesterol: 79 mg/dL (ref 0–99)
VLDL: 11.6 mg/dL (ref 0.0–40.0)

## 2011-01-22 ENCOUNTER — Encounter: Payer: Self-pay | Admitting: *Deleted

## 2011-02-22 NOTE — Procedures (Signed)
[facility]  Patient:    Gabriela Patel, Gabriela Patel                      MRN: [id number] Proc. Date: 05/12/00 Adm. Date:  [id number] Attending:  Sabino Gasser                           Procedure Report  PROCEDURE PERFORMED:  Colonoscopy.  ENDOSCOPIST:  Sabino Gasser, M.D.  INDICATIONS FOR PROCEDURE:  Rectal bleeding.  ANESTHESIA:  Demerol 70 mg, Versed 6.5 mg were given intravenously in divided dose.  DESCRIPTION OF PROCEDURE:  With the patient mildly sedated in the left lateral decubitus position, the Olympus videoscopic colonoscope was inserted in the rectum and passed under direct vision into the sigmoid colon and at that point there was a tight turn we could not advance through, so the endoscope was withdrawn.  Subsequently the pediatric Olympus video colonoscope was inserted into the rectum and passed through a rather tortuous sigmoid colon and eventually reached the cecum.  The cecum was identified by ileocecal valve and appendiceal orifice, both of which were photographed.  From this point, the colonoscope was slowly withdrawn, taking circumferential views of the entire colonic mucosa with a somewhat limited view through the sigmoid colon because of the tortuosity.  Diverticula were seen here as well as hemorrhoids on retroflex view of the rectum.  The endoscope was then straightened and withdrawn.  The patients vital signs and pulse oximeter remained stable.  The patient tolerated the procedure well and without apparent complications.  FINDINGS: 1. Significant diverticulosis of the sigmoid colon with tortuosity and wall    thickening and internal hemorrhoids.  Otherwise unremarkable exam.  PLAN: Have patient follow up with me in five years or as needed. DD:  05/12/00 TD:  05/12/00 Job: [id number]

## 2011-03-10 ENCOUNTER — Other Ambulatory Visit: Payer: Self-pay | Admitting: Family Medicine

## 2011-03-17 ENCOUNTER — Other Ambulatory Visit: Payer: Self-pay | Admitting: Family Medicine

## 2011-04-30 ENCOUNTER — Encounter: Payer: Self-pay | Admitting: Family Medicine

## 2011-04-30 ENCOUNTER — Ambulatory Visit (INDEPENDENT_AMBULATORY_CARE_PROVIDER_SITE_OTHER): Payer: Medicare Other | Admitting: Family Medicine

## 2011-04-30 VITALS — BP 130/80 | HR 73 | Temp 98.7°F | Wt 148.0 lb

## 2011-04-30 DIAGNOSIS — N390 Urinary tract infection, site not specified: Secondary | ICD-10-CM

## 2011-04-30 DIAGNOSIS — R5383 Other fatigue: Secondary | ICD-10-CM

## 2011-04-30 DIAGNOSIS — E538 Deficiency of other specified B group vitamins: Secondary | ICD-10-CM

## 2011-04-30 DIAGNOSIS — R5381 Other malaise: Secondary | ICD-10-CM

## 2011-04-30 DIAGNOSIS — E785 Hyperlipidemia, unspecified: Secondary | ICD-10-CM

## 2011-04-30 LAB — POCT URINALYSIS DIPSTICK
Bilirubin, UA: NEGATIVE
Blood, UA: NEGATIVE
Glucose, UA: NEGATIVE
Nitrite, UA: NEGATIVE

## 2011-04-30 MED ORDER — PRAVASTATIN SODIUM 40 MG PO TABS: 40.0000 mg | ORAL_TABLET | Freq: Every evening | ORAL | Status: DC

## 2011-04-30 NOTE — Progress Notes (Signed)
Addended by: Legrand Como on: 04/30/2011 03:20 PM   Modules accepted: Orders

## 2011-04-30 NOTE — Progress Notes (Signed)
  Subjective:     Gabriela Patel is a 76 y.o. female who presents for evaluation of fatigue. Symptoms began when she started lipitor. The patient feels the fatigue began with: lipitor. Symptoms of her fatigue have been general malaise. Patient describes the following psychological symptoms: none. Patient denies change in hair texture, cold intolerance, constipation, excessive menstrual bleeding, fever, GI blood loss, significant change in weight, unusual rashes and witnessed or suspected sleep apnea. Symptoms have stabilized. Symptom severity: struggles to carry out day to day responsibilities.. Previous visits for this problem: none.   The following portions of the patient's history were reviewed and updated as appropriate: allergies, current medications, past family history, past medical history, past social history, past surgical history and problem list.  Review of Systems Pertinent items are noted in HPI.    Objective:    BP 130/80  Pulse 73  Temp(Src) 98.7 F (37.1 C) (Oral)  Wt 148 lb (67.132 kg)  SpO2 96% General appearance: alert, cooperative, appears stated age and no distress Head: Normocephalic, without obvious abnormality, atraumatic Lungs: clear to auscultation bilaterally Heart: S1, S2 normal Extremities: extremities normal, atraumatic, no cyanosis or edema Neurologic: Grossly normal psych--no depression or anxiety    Assessment:    Fatigue, organic etiology unlikely based on careful history and exam. pt thinks its from lipitor---change to pravachol    Plan:    Discussed diagnosis with patient. See orders for lab evaluation. f/u prn

## 2011-04-30 NOTE — Patient Instructions (Signed)
Stop lipitor  Start pravachol every night and take with Co Q10 to help prevent muscle aches We will call you with your labs results.

## 2011-05-01 LAB — BASIC METABOLIC PANEL
BUN: 9 mg/dL (ref 6–23)
CO2: 29 mEq/L (ref 19–32)
Calcium: 9.3 mg/dL (ref 8.4–10.5)
Creatinine, Ser: 0.8 mg/dL (ref 0.4–1.2)
Glucose, Bld: 100 mg/dL — ABNORMAL HIGH (ref 70–99)

## 2011-05-01 LAB — CBC WITH DIFFERENTIAL/PLATELET
Basophils Absolute: 0 10*3/uL (ref 0.0–0.1)
Eosinophils Absolute: 0.2 10*3/uL (ref 0.0–0.7)
Hemoglobin: 13.7 g/dL (ref 12.0–15.0)
Lymphocytes Relative: 28.7 % (ref 12.0–46.0)
MCHC: 33.5 g/dL (ref 30.0–36.0)
Neutro Abs: 5.1 10*3/uL (ref 1.4–7.7)
Neutrophils Relative %: 63.2 % (ref 43.0–77.0)
Platelets: 172 10*3/uL (ref 150.0–400.0)
RDW: 14.1 % (ref 11.5–14.6)

## 2011-05-01 LAB — HEPATIC FUNCTION PANEL
Albumin: 4.2 g/dL (ref 3.5–5.2)
Alkaline Phosphatase: 62 U/L (ref 39–117)

## 2011-05-02 LAB — URINE CULTURE: Colony Count: 50000

## 2011-05-21 ENCOUNTER — Ambulatory Visit (INDEPENDENT_AMBULATORY_CARE_PROVIDER_SITE_OTHER): Payer: Medicare Other | Admitting: Family Medicine

## 2011-05-21 ENCOUNTER — Telehealth: Payer: Self-pay | Admitting: Family Medicine

## 2011-05-21 DIAGNOSIS — E785 Hyperlipidemia, unspecified: Secondary | ICD-10-CM

## 2011-05-21 DIAGNOSIS — R5381 Other malaise: Secondary | ICD-10-CM

## 2011-05-21 DIAGNOSIS — R5383 Other fatigue: Secondary | ICD-10-CM

## 2011-05-21 NOTE — Progress Notes (Signed)
  Subjective:    Patient ID: Gabriela Patel, female    DOB: [birth year], 77 y.o.   MRN: [id number]  HPI Pt here still c/o fatigue and now wonders if it is generic statins.   Review of Systems  Constitutional: Positive for fatigue. Negative for fever, chills, diaphoresis, activity change, appetite change and unexpected weight change.       Objective:   Physical Exam  Constitutional: She appears well-developed and well-nourished.  Psychiatric: She has a normal mood and affect. Her behavior is normal. Judgment and thought content normal.          Assessment & Plan:  Fatigue--- no better with pravastatin,  May even be worse Stop pravachol completely and call in two weeks---may try 1/2 dose or low dose crestor 3x a week

## 2011-05-21 NOTE — Patient Instructions (Signed)
Stop pravastatin ----call in 2 weeks to let us know if you feel better.

## 2011-05-21 NOTE — Telephone Encounter (Deleted)
Info put in in error

## 2011-05-21 NOTE — Telephone Encounter (Signed)
Need more info

## 2011-06-04 ENCOUNTER — Telehealth: Payer: Self-pay | Admitting: *Deleted

## 2011-06-04 NOTE — Telephone Encounter (Signed)
Pt states that since stopping pravachol she does feel a little better but no real changes. Pt would like to know id she needs to restart med or if a new med will be Rx Please advise

## 2011-06-04 NOTE — Telephone Encounter (Signed)
Discussed with patient and she voiced understanding and agreed to call if any changes    KP

## 2011-06-04 NOTE — Telephone Encounter (Signed)
If she doesn't really feel any different she can start it again.  If any changes ---let us know

## 2011-07-13 ENCOUNTER — Other Ambulatory Visit: Payer: Self-pay | Admitting: Family Medicine

## 2011-07-17 ENCOUNTER — Other Ambulatory Visit (INDEPENDENT_AMBULATORY_CARE_PROVIDER_SITE_OTHER): Payer: Medicare Other

## 2011-07-17 VITALS — Temp 97.9°F

## 2011-07-17 DIAGNOSIS — E785 Hyperlipidemia, unspecified: Secondary | ICD-10-CM

## 2011-07-17 DIAGNOSIS — R7989 Other specified abnormal findings of blood chemistry: Secondary | ICD-10-CM

## 2011-07-17 DIAGNOSIS — Z23 Encounter for immunization: Secondary | ICD-10-CM

## 2011-07-17 LAB — BASIC METABOLIC PANEL
BUN: 9 mg/dL (ref 6–23)
CO2: 28 mEq/L (ref 19–32)
Chloride: 104 mEq/L (ref 96–112)
Creatinine, Ser: 0.8 mg/dL (ref 0.4–1.2)

## 2011-07-17 LAB — HEPATIC FUNCTION PANEL
Alkaline Phosphatase: 64 U/L (ref 39–117)
Bilirubin, Direct: 0.1 mg/dL (ref 0.0–0.3)
Total Bilirubin: 0.6 mg/dL (ref 0.3–1.2)

## 2011-07-17 LAB — LIPID PANEL
LDL Cholesterol: 71 mg/dL (ref 0–99)
Total CHOL/HDL Ratio: 2

## 2011-07-17 NOTE — Progress Notes (Signed)
Labs only

## 2011-07-24 ENCOUNTER — Other Ambulatory Visit: Payer: Medicare Other

## 2011-08-08 ENCOUNTER — Encounter: Payer: Self-pay | Admitting: Family Medicine

## 2011-08-11 ENCOUNTER — Other Ambulatory Visit: Payer: Self-pay | Admitting: Family Medicine

## 2011-10-07 ENCOUNTER — Other Ambulatory Visit: Payer: Self-pay | Admitting: Family Medicine

## 2011-10-07 MED ORDER — METOPROLOL TARTRATE 25 MG PO TABS: 25.0000 mg | ORAL_TABLET | Freq: Two times a day (BID) | ORAL | Status: DC

## 2011-10-07 NOTE — Telephone Encounter (Signed)
Faxed.   KP 

## 2011-12-18 ENCOUNTER — Telehealth: Payer: Self-pay | Admitting: *Deleted

## 2011-12-18 NOTE — Telephone Encounter (Signed)
Office Message [address] [place], [place] [address] p. [phone] f. [phone] To: [facility] (Daytime Triage) Fax: [phone] From: [facility] Date/ Time: 12/18/2011 10:53 AM Taken By: Di Kindle, RN Caller: Nicole Cella Facility: not collected Patient: Lyle, Leisner DOB: 1926/10/14 Phone: [phone] Reason for Call: Please call pt re: next scheduled appt due to being on chol RX, states last appt was 10/12.

## 2012-01-03 ENCOUNTER — Other Ambulatory Visit: Payer: Self-pay | Admitting: Family Medicine

## 2012-01-15 ENCOUNTER — Other Ambulatory Visit (INDEPENDENT_AMBULATORY_CARE_PROVIDER_SITE_OTHER): Payer: Medicare Other

## 2012-01-15 DIAGNOSIS — R7989 Other specified abnormal findings of blood chemistry: Secondary | ICD-10-CM

## 2012-01-15 DIAGNOSIS — E785 Hyperlipidemia, unspecified: Secondary | ICD-10-CM

## 2012-01-15 LAB — BASIC METABOLIC PANEL
CO2: 25 mEq/L (ref 19–32)
Calcium: 9.1 mg/dL (ref 8.4–10.5)
Creatinine, Ser: 0.7 mg/dL (ref 0.4–1.2)
Glucose, Bld: 114 mg/dL — ABNORMAL HIGH (ref 70–99)

## 2012-01-15 LAB — LIPID PANEL
Cholesterol: 164 mg/dL (ref 0–200)
HDL: 85.6 mg/dL (ref >39.00)
VLDL: 9.4 mg/dL (ref 0.0–40.0)

## 2012-01-15 LAB — HEPATIC FUNCTION PANEL
AST: 27 U/L (ref 0–37)
Albumin: 4.1 g/dL (ref 3.5–5.2)

## 2012-01-15 NOTE — Telephone Encounter (Signed)
Pt had lab appt today

## 2012-01-17 LAB — HEMOGLOBIN A1C: Hgb A1c MFr Bld: 5.9 % (ref 4.6–6.5)

## 2012-02-02 ENCOUNTER — Other Ambulatory Visit: Payer: Self-pay | Admitting: Family Medicine

## 2012-02-04 ENCOUNTER — Other Ambulatory Visit: Payer: Self-pay | Admitting: Family Medicine

## 2012-02-04 MED ORDER — METOPROLOL TARTRATE 25 MG PO TABS: 25.0000 mg | ORAL_TABLET | Freq: Two times a day (BID) | ORAL | Status: DC

## 2012-02-04 NOTE — Telephone Encounter (Signed)
Refill for Metoprolol Tartrate 25MG  TAB Requesting 90day supply  Last written 03.29.13 Qty 60 Instructions TAKE 1 TABLET (25 MG TOTAL) BY MOUTH 2 (TWO) TIMES DAILY.  Last OV 8.14.12

## 2012-04-13 ENCOUNTER — Inpatient Hospital Stay (HOSPITAL_COMMUNITY)
Admission: EM | Admit: 2012-04-13 | Discharge: 2012-04-17 | DRG: 690 | Disposition: A | Discharge status: Skilled Nursing Facility | Payer: Medicare Other | Attending: Family Medicine | Admitting: Family Medicine

## 2012-04-13 ENCOUNTER — Emergency Department (HOSPITAL_COMMUNITY): Payer: Medicare Other

## 2012-04-13 ENCOUNTER — Encounter (HOSPITAL_COMMUNITY): Payer: Self-pay | Admitting: *Deleted

## 2012-04-13 DIAGNOSIS — M545 Low back pain: Secondary | ICD-10-CM

## 2012-04-13 DIAGNOSIS — M25559 Pain in unspecified hip: Secondary | ICD-10-CM

## 2012-04-13 DIAGNOSIS — I10 Essential (primary) hypertension: Secondary | ICD-10-CM | POA: Diagnosis present

## 2012-04-13 DIAGNOSIS — E785 Hyperlipidemia, unspecified: Secondary | ICD-10-CM

## 2012-04-13 DIAGNOSIS — I6350 Cerebral infarction due to unspecified occlusion or stenosis of unspecified cerebral artery: Secondary | ICD-10-CM

## 2012-04-13 DIAGNOSIS — IMO0001 Reserved for inherently not codable concepts without codable children: Secondary | ICD-10-CM

## 2012-04-13 DIAGNOSIS — I69998 Other sequelae following unspecified cerebrovascular disease: Secondary | ICD-10-CM

## 2012-04-13 DIAGNOSIS — R29898 Other symptoms and signs involving the musculoskeletal system: Secondary | ICD-10-CM | POA: Diagnosis present

## 2012-04-13 DIAGNOSIS — Z8679 Personal history of other diseases of the circulatory system: Secondary | ICD-10-CM

## 2012-04-13 DIAGNOSIS — E860 Dehydration: Secondary | ICD-10-CM

## 2012-04-13 DIAGNOSIS — K5909 Other constipation: Secondary | ICD-10-CM | POA: Diagnosis present

## 2012-04-13 DIAGNOSIS — E871 Hypo-osmolality and hyponatremia: Secondary | ICD-10-CM | POA: Diagnosis present

## 2012-04-13 DIAGNOSIS — H8110 Benign paroxysmal vertigo, unspecified ear: Secondary | ICD-10-CM | POA: Diagnosis present

## 2012-04-13 DIAGNOSIS — I739 Peripheral vascular disease, unspecified: Secondary | ICD-10-CM

## 2012-04-13 DIAGNOSIS — E876 Hypokalemia: Secondary | ICD-10-CM | POA: Diagnosis present

## 2012-04-13 DIAGNOSIS — T148XXA Other injury of unspecified body region, initial encounter: Secondary | ICD-10-CM

## 2012-04-13 DIAGNOSIS — D72829 Elevated white blood cell count, unspecified: Secondary | ICD-10-CM

## 2012-04-13 DIAGNOSIS — R5381 Other malaise: Secondary | ICD-10-CM | POA: Diagnosis present

## 2012-04-13 DIAGNOSIS — K5904 Chronic idiopathic constipation: Secondary | ICD-10-CM

## 2012-04-13 DIAGNOSIS — R7309 Other abnormal glucose: Secondary | ICD-10-CM | POA: Diagnosis present

## 2012-04-13 DIAGNOSIS — N390 Urinary tract infection, site not specified: Principal | ICD-10-CM | POA: Diagnosis present

## 2012-04-13 DIAGNOSIS — Z8719 Personal history of other diseases of the digestive system: Secondary | ICD-10-CM

## 2012-04-13 DIAGNOSIS — M1990 Unspecified osteoarthritis, unspecified site: Secondary | ICD-10-CM | POA: Diagnosis present

## 2012-04-13 DIAGNOSIS — R531 Weakness: Secondary | ICD-10-CM

## 2012-04-13 DIAGNOSIS — M79609 Pain in unspecified limb: Secondary | ICD-10-CM

## 2012-04-13 DIAGNOSIS — M810 Age-related osteoporosis without current pathological fracture: Secondary | ICD-10-CM | POA: Diagnosis present

## 2012-04-13 LAB — CBC
HCT: 41.4 % (ref 36.0–46.0)
MCH: 31.3 pg (ref 26.0–34.0)
MCV: 91.2 fL (ref 78.0–100.0)
Platelets: 240 10*3/uL (ref 150–400)
RDW: 13.6 % (ref 11.5–15.5)

## 2012-04-13 LAB — URINALYSIS, ROUTINE W REFLEX MICROSCOPIC
Bilirubin Urine: NEGATIVE
Ketones, ur: 15 mg/dL — AB
Nitrite: POSITIVE — AB
Protein, ur: NEGATIVE mg/dL
Urobilinogen, UA: 0.2 mg/dL (ref 0.0–1.0)

## 2012-04-13 LAB — COMPREHENSIVE METABOLIC PANEL
ALT: 20 U/L (ref 0–35)
CO2: 21 mEq/L (ref 19–32)
Calcium: 9.3 mg/dL (ref 8.4–10.5)
Creatinine, Ser: 0.52 mg/dL (ref 0.50–1.10)
GFR calc Af Amer: >90 mL/min (ref >90)
GFR calc non Af Amer: 86 mL/min — ABNORMAL LOW (ref >90)
Glucose, Bld: 177 mg/dL — ABNORMAL HIGH (ref 70–99)
Sodium: 130 mEq/L — ABNORMAL LOW (ref 135–145)
Total Protein: 7.7 g/dL (ref 6.0–8.3)

## 2012-04-13 LAB — GLUCOSE, CAPILLARY

## 2012-04-13 LAB — DIFFERENTIAL
Basophils Absolute: 0 10*3/uL (ref 0.0–0.1)
Eosinophils Absolute: 0 10*3/uL (ref 0.0–0.7)
Eosinophils Relative: 0 % (ref 0–5)
Lymphocytes Relative: 3 % — ABNORMAL LOW (ref 12–46)
Lymphs Abs: 0.6 10*3/uL — ABNORMAL LOW (ref 0.7–4.0)
Monocytes Absolute: 0.6 10*3/uL (ref 0.1–1.0)

## 2012-04-13 LAB — URINE MICROSCOPIC-ADD ON

## 2012-04-13 LAB — CK TOTAL AND CKMB (NOT AT ARMC)
CK, MB: 3.1 ng/mL (ref 0.3–4.0)
Relative Index: INVALID (ref 0.0–2.5)
Total CK: 81 U/L (ref 7–177)

## 2012-04-13 LAB — PROTIME-INR: Prothrombin Time: 13 seconds (ref 11.6–15.2)

## 2012-04-13 LAB — TROPONIN I: Troponin I: <0.3 ng/mL (ref <0.30)

## 2012-04-13 MED ORDER — SODIUM CHLORIDE 0.9 % IJ SOLN
3.0000 mL | Freq: Two times a day (BID) | INTRAMUSCULAR | Status: DC
  Administered 2012-04-13 – 2012-04-17 (×6): 3 mL via INTRAVENOUS

## 2012-04-13 MED ORDER — SIMVASTATIN 20 MG PO TABS
20.0000 mg | ORAL_TABLET | Freq: Every day | ORAL | Status: DC
  Administered 2012-04-14 – 2012-04-16 (×3): 20 mg via ORAL
  Filled 2012-04-13 (×4): qty 1

## 2012-04-13 MED ORDER — METOPROLOL TARTRATE 25 MG PO TABS
25.0000 mg | ORAL_TABLET | Freq: Two times a day (BID) | ORAL | Status: DC
  Administered 2012-04-13 – 2012-04-17 (×8): 25 mg via ORAL
  Filled 2012-04-13 (×9): qty 1

## 2012-04-13 MED ORDER — SODIUM CHLORIDE 0.9 % IJ SOLN: 3.0000 mL | INTRAMUSCULAR | Status: DC | PRN

## 2012-04-13 MED ORDER — ADULT MULTIVITAMIN W/MINERALS CH
1.0000 | ORAL_TABLET | Freq: Every day | ORAL | Status: DC
  Administered 2012-04-14 – 2012-04-17 (×4): 1 via ORAL
  Filled 2012-04-13 (×5): qty 1

## 2012-04-13 MED ORDER — SODIUM CHLORIDE 0.9 % IV SOLN: 250.0000 mL | INTRAVENOUS | Status: DC | PRN

## 2012-04-13 MED ORDER — OMEGA-3 FATTY ACIDS 1000 MG PO CAPS: 2.0000 g | ORAL_CAPSULE | Freq: Every day | ORAL | Status: DC

## 2012-04-13 MED ORDER — ASPIRIN 81 MG PO CHEW
81.0000 mg | CHEWABLE_TABLET | Freq: Every day | ORAL | Status: DC
  Administered 2012-04-13 – 2012-04-17 (×5): 81 mg via ORAL
  Filled 2012-04-13 (×5): qty 1

## 2012-04-13 MED ORDER — SODIUM CHLORIDE 0.9 % IV SOLN
INTRAVENOUS | Status: DC
  Administered 2012-04-13 – 2012-04-14 (×2): via INTRAVENOUS

## 2012-04-13 MED ORDER — MECLIZINE HCL 12.5 MG PO TABS
12.5000 mg | ORAL_TABLET | Freq: Three times a day (TID) | ORAL | Status: DC | PRN
  Filled 2012-04-13: qty 1

## 2012-04-13 MED ORDER — OMEGA-3-ACID ETHYL ESTERS 1 G PO CAPS
2.0000 g | ORAL_CAPSULE | Freq: Every day | ORAL | Status: DC
  Administered 2012-04-13 – 2012-04-17 (×5): 2 g via ORAL
  Filled 2012-04-13 (×5): qty 2

## 2012-04-13 MED ORDER — SODIUM CHLORIDE 0.9 % IV BOLUS (SEPSIS)
250.0000 mL | Freq: Once | INTRAVENOUS | Status: AC
  Administered 2012-04-13: 250 mL via INTRAVENOUS

## 2012-04-13 MED ORDER — IBUPROFEN 800 MG PO TABS
400.0000 mg | ORAL_TABLET | Freq: Four times a day (QID) | ORAL | Status: DC | PRN
  Administered 2012-04-13: 400 mg via ORAL
  Filled 2012-04-13: qty 1

## 2012-04-13 MED ORDER — ENOXAPARIN SODIUM 40 MG/0.4ML ~~LOC~~ SOLN
40.0000 mg | SUBCUTANEOUS | Status: DC
  Administered 2012-04-13 – 2012-04-16 (×4): 40 mg via SUBCUTANEOUS
  Filled 2012-04-13 (×5): qty 0.4

## 2012-04-13 MED ORDER — POTASSIUM CHLORIDE CRYS ER 20 MEQ PO TBCR
40.0000 meq | EXTENDED_RELEASE_TABLET | Freq: Once | ORAL | Status: AC
  Administered 2012-04-13: 40 meq via ORAL
  Filled 2012-04-13: qty 2

## 2012-04-13 MED ORDER — DEXTROSE 5 % IV SOLN
1.0000 g | INTRAVENOUS | Status: DC
  Administered 2012-04-13 – 2012-04-15 (×3): 1 g via INTRAVENOUS
  Filled 2012-04-13 (×4): qty 10

## 2012-04-13 NOTE — ED Notes (Signed)
Pt states "we ate @ [employer] yesterday, my sister got sick but I just feel weak/tired, doesn't feel like when I had my stroke, I'm dizzy when I get up to walk, not taking any new medications"

## 2012-04-13 NOTE — ED Notes (Signed)
Floor Unit RN unavailable for report.

## 2012-04-13 NOTE — ED Provider Notes (Signed)
History    CSN: [id number] Arrival date & time 04/13/12  1237 First MD Initiated Contact with Patient 04/13/12 1500   PCP [place]  Chief Complaint  Patient presents with  . Fatigue  . Dizziness   HPI Pt had a stroke about one year ago.  Since then she has felt tired all of the time.  Today however she felt dizzy. It increases with standing.  She has trouble when she tries to walk. No trouble with speech.  No trouble with her coordination , ie picking up objects.  She has had it off and once since the stroke but today it was worse.  She also felt like the last few days the right side of her face has felt strange, like it is not working correctly when she tries to chew.  She has been fatigued and gets tired easily with activity.  She does have residual symptoms on her left side since the stroke.  Past Medical History  Diagnosis Date  . Hypertension   . Osteoporosis   . CVA (cerebral vascular accident) 11/01/2009  . Osteoarthritis     Past Surgical History  Procedure Date  . Abdominal hysterectomy   . Cataract extraction     Left  . Vein ligation     Right Leg    Family History  Problem Relation Age of Onset  . Coronary artery disease    . Hip fracture Mother     B/L    History  Substance Use Topics  . Smoking status: Never Smoker   . Smokeless tobacco: Never Used  . Alcohol Use: No    OB History    Grav Para Term Preterm Abortions TAB SAB Ect Mult Living                  Review of Systems  Constitutional: Negative for fever.  Respiratory: Negative for cough.   Cardiovascular: Negative for chest pain.  Genitourinary: Negative for enuresis.  Neurological: Negative for headaches.  All other systems reviewed and are negative.    Allergies  Review of patient's allergies indicates no known allergies.  Home Medications   Current Outpatient Rx  Name Route Sig Dispense Refill  . ASPIRIN 81 MG PO TABS Oral Take 81 mg by mouth daily.      Marland Kitchen CALTRATE 600 PO Oral Take  by mouth.    . OMEGA-3 FATTY ACIDS 1000 MG PO CAPS Oral Take 2 g by mouth daily.    . IBUPROFEN 200 MG PO TABS Oral Take 400 mg by mouth every 6 (six) hours as needed. For pain    . METOPROLOL TARTRATE 25 MG PO TABS Oral Take 1 tablet (25 mg total) by mouth 2 (two) times daily. 180 tablet 0    Office visit due now  . ADULT MULTIVITAMIN W/MINERALS CH Oral Take 1 tablet by mouth daily.    Marland Kitchen PRAVASTATIN SODIUM 40 MG PO TABS Oral Take 40 mg by mouth daily.      BP 165/66  Pulse 80  Temp 98.1 F (36.7 C) (Oral)  Resp 17  Wt 140 lb (63.504 kg)  SpO2 100%  Physical Exam  Nursing note and vitals reviewed. Constitutional: She is oriented to person, place, and time. She appears well-developed and well-nourished. No distress.  HENT:  Head: Normocephalic and atraumatic.  Right Ear: External ear normal.  Left Ear: External ear normal.  Mouth/Throat: Oropharynx is clear and moist.  Eyes: Conjunctivae are normal. Right eye exhibits no discharge. Left eye  exhibits no discharge. No scleral icterus.  Neck: Neck supple. No tracheal deviation present.  Cardiovascular: Normal rate, regular rhythm and intact distal pulses.   Pulmonary/Chest: Effort normal and breath sounds normal. No stridor. No respiratory distress. She has no wheezes. She has no rales.  Abdominal: Soft. Bowel sounds are normal. She exhibits no distension. There is no tenderness. There is no rebound and no guarding.  Musculoskeletal: She exhibits no edema and no tenderness.  Neurological: She is alert and oriented to person, place, and time. She has normal strength. No sensory deficit. Cranial nerve deficit:  no gross defecits noted. She exhibits normal muscle tone. She displays no seizure activity. Coordination normal.       No pronator drift bilateral upper extrem, able to hold both legs off bed for 5 seconds, sensation intact in all extremities, no visual field cuts, no left or right sided neglect, negative Romberg  Skin: Skin is warm  and dry. No rash noted.  Psychiatric: She has a normal mood and affect.    ED Course  Procedures (including critical care time)  Rate: 70  Rhythm: normal sinus rhythm  QRS Axis: normal  Intervals: normal  ST/T Wave abnormalities: normal  Conduction Disutrbances:none  Narrative Interpretation:   Old EKG Reviewed: none available  Labs Reviewed  CBC - Abnormal; Notable for the following:    WBC 18.3 (*)     All other components within normal limits  DIFFERENTIAL - Abnormal; Notable for the following:    Neutrophils Relative 93 (*)     Neutro Abs 17.1 (*)     Lymphocytes Relative 3 (*)     Lymphs Abs 0.6 (*)     All other components within normal limits  COMPREHENSIVE METABOLIC PANEL - Abnormal; Notable for the following:    Sodium 130 (*)     Potassium 3.4 (*)     Chloride 95 (*)     Glucose, Bld 177 (*)     GFR calc non Af Amer 86 (*)     All other components within normal limits  URINALYSIS, ROUTINE W REFLEX MICROSCOPIC - Abnormal; Notable for the following:    APPearance TURBID (*)     Hgb urine dipstick SMALL (*)     Ketones, ur 15 (*)     Nitrite POSITIVE (*)     Leukocytes, UA MODERATE (*)     All other components within normal limits  GLUCOSE, CAPILLARY - Abnormal; Notable for the following:    Glucose-Capillary 163 (*)     All other components within normal limits  URINE MICROSCOPIC-ADD ON - Abnormal; Notable for the following:    Squamous Epithelial / LPF FEW (*)     Bacteria, UA MANY (*)     All other components within normal limits  PROTIME-INR  APTT  CK TOTAL AND CKMB  TROPONIN I   Dg Chest 2 View  04/13/2012  *RADIOLOGY REPORT*  Clinical Data: Fatigue, dizziness, weakness, shortness of breath  CHEST - 2 VIEW  Comparison: 11/02/2009  Findings: Stable heart size and vascularity.  Aortic tortuosity evident.  No focal pneumonia, collapse, consolidation, edema, effusion, or pneumothorax.  Trachea midline.  Stable mild compression fractures of the mid to lower  thoracic spine.  IMPRESSION: Stable exam.  No acute chest process.  Original Report Authenticated By: Judie Petit. Ruel Favors, M.D.   Ct Head Wo Contrast  04/13/2012  *RADIOLOGY REPORT*  Clinical Data: Weakness and dizziness.  Right facial numbness and droop.  History high blood pressure.  CT HEAD WITHOUT CONTRAST  Technique:  Contiguous axial images were obtained from the base of the skull through the vertex without contrast.  Comparison: 11/02/2009 MR.  10/31/2009 CT.  Findings: No intracranial hemorrhage.  Multiple remote infarcts involve the basal ganglia and thalami. Prominent small vessel disease type changes.  No CT evidence of large acute infarct.  Global atrophy without hydrocephalus.  Vascular calcifications.  No intracranial mass lesion detected on this unenhanced exam. Visualized sinuses and mastoid air cells are clear.  IMPRESSION: No intracranial hemorrhage or CT evidence of large acute infarct.  Remote infarcts and small vessel disease type changes.  Vascular calcifications.  Original Report Authenticated By: Fuller Canada, M.D.     MDM  The patient's urinalysis is consistent with a urinary tract infection. She also has mild hyponatremia which is somewhat worse when compared to prior labs.  Patient has no acute focal neurologic deficits. A low suspicion for an acute cerebrovascular event. I suspect her symptoms are related to the urinary tract infection.  Patient states she's feeling weak still when she tries to stand up. Sitting her age, leukocytosis and comorbidities I will consult the hospitalist for admission for IV antibiotics and monitoring.        Celene Kras, MD 04/13/12 [phone]

## 2012-04-13 NOTE — H&P (Signed)
PCP:  Loreen Freud, DO   DOA:  04/13/2012 12:37 PM  Chief Complaint:  Fatigue and weakness x several months  dizziness x 1 day  HPI: 78 y/o pleasant female with hx of rt sided infarct in 2011 with some left lower extremity residual weakness presents with dizziness and increasing fatigue x 1 day.  patient accompanied by her sister and informs that she has been increasingly fatigued with her usual household work for past several months. On reviewing her PCP note 10 months back she had symptoms of  fatigue and malaise. She informs feeling dizzy and unsteady on standing up and walking. Says she has had this symptoms for a while but today it is more severe. She denies any fever,chills, headache, blurry vision, nausea, vomiting, loss of weight or appetite. Denies any new weakness of her extremities. Denies SOB, chest pain, palpitations, dysuria, urinary frequency or bowel symptoms. In the ED she was noted to have leucocytosis with mild  hyponatremia and hypokalemia and UA suggestive of UTI. triad hospitalist called for admission.   Allergies: No Known Allergies  Prior to Admission medications   Medication Sig Start Date End Date Taking? Authorizing Provider  aspirin 81 MG tablet Take 81 mg by mouth daily.     Yes Historical Provider, MD  Calcium Carbonate (CALTRATE 600 PO) Take by mouth.   Yes Historical Provider, MD  fish oil-omega-3 fatty acids 1000 MG capsule Take 2 g by mouth daily.   Yes Historical Provider, MD  ibuprofen (ADVIL,MOTRIN) 200 MG tablet Take 400 mg by mouth every 6 (six) hours as needed. For pain   Yes Historical Provider, MD  metoprolol tartrate (LOPRESSOR) 25 MG tablet Take 1 tablet (25 mg total) by mouth 2 (two) times daily. 02/04/12  Yes Lelon Perla, DO  Multiple Vitamin (MULTIVITAMIN WITH MINERALS) TABS Take 1 tablet by mouth daily.   Yes Historical Provider, MD  pravastatin (PRAVACHOL) 40 MG tablet Take 40 mg by mouth daily.   Yes Historical Provider, MD    Past  Medical History  Diagnosis Date  . Hypertension   . Osteoporosis   . CVA (cerebral vascular accident) 11/01/2009  . Osteoarthritis     Past Surgical History  Procedure Date  . Abdominal hysterectomy   . Cataract extraction     Left  . Vein ligation     Right Leg    Social History:  reports that she has never smoked. She has never used smokeless tobacco. She reports that she does not drink alcohol or use illicit drugs.  Family History  Problem Relation Age of Onset  . Coronary artery disease    . Hip fracture Mother     B/L    Review of Systems: ( positive in bold) Constitutional: fatigue, Denies fever, chills, diaphoresis, appetite change   HEENT: Denies photophobia, eye pain, redness, hearing loss, ear pain, congestion, sore throat, rhinorrhea, sneezing, mouth sores, trouble swallowing, neck pain, neck stiffness and tinnitus.   Respiratory: Denies SOB, DOE, cough, chest tightness,  and wheezing.   Cardiovascular: Denies chest pain, palpitations and leg swelling.  Gastrointestinal: Denies nausea, vomiting, abdominal pain, diarrhea, constipation, blood in stool and abdominal distention.  Genitourinary: Denies dysuria, urgency, frequency, hematuria, flank pain and difficulty urinating.  Musculoskeletal: Denies myalgias, back pain, joint swelling, arthralgias and gait problem.  Skin: Denies pallor, rash and wound.  Neurological:  dizziness, Denies seizures, syncope, new weakness, light-headedness, numbness and headaches. has some residual weakness of left LE following stroke in 2011 Hematological: Denies  adenopathy. Easy bruising, personal or family bleeding history  Psychiatric/Behavioral: Denies suicidal ideation, mood changes, confusion, nervousness, sleep disturbance and agitation   Physical Exam:  Filed Vitals:   04/13/12 1244 04/13/12 1524 04/13/12 1630  BP: 165/66  174/61  Pulse: 80  78  Temp: 98.1 F (36.7 C) 98 F (36.7 C) 97.8 F (36.6 C)  TempSrc: Oral  Oral    Resp: 17  16  Weight: 63.504 kg (140 lb)    SpO2: 100%  96%    Constitutional: Vital signs reviewed.  Patient is a well-developed and well-nourished in no acute distress and cooperative with exam. Alert and oriented x3.  Head: Normocephalic and atraumatic Ear: TM normal bilaterally Mouth: no erythema or exudates, MMM Eyes: PERRL, EOMI, conjunctivae normal, No scleral icterus.  Neck: Supple, Trachea midline normal ROM, No JVD, mass, thyromegaly, or carotid bruit present.  Cardiovascular: RRR, S1 normal, S2 normal, no MRG, pulses symmetric and intact bilaterally Pulmonary/Chest: CTAB, no wheezes, rales, or rhonchi Abdominal: Soft. Non-tender, non-distended, bowel sounds are normal, no masses, organomegaly, or guarding present.  GU: no CVA tenderness Musculoskeletal: No joint deformities, erythema, or stiffness, ROM full and no nontender Ext: no edema and no cyanosis, pulses palpable bilaterally (DP and PT) Hematology: no cervical, inginal, or axillary adenopathy.  Neurological: A&O x3, Strenght is normal and symmetric bilaterally, left leg mildly inverted.cranial nerve II-XII are grossly intact, no focal motor deficit, sensory intact to light touch bilaterally. unable to assess gait due to dizziness, no cerebellar signs. No meningeal signs. Skin: Warm, dry and intact. No rash, cyanosis, or clubbing.  Psychiatric: Normal mood and affect. speech and behavior is normal. Judgment and thought content normal. Cognition and memory are normal.   Labs on Admission:  Results for orders placed during the hospital encounter of 04/13/12 (from the past 48 hour(s))  PROTIME-INR     Status: Normal   Collection Time   04/13/12  1:30 PM      Component Value Range Comment   Prothrombin Time 13.0  11.6 - 15.2 seconds    INR 0.96  0.00 - 1.49   APTT     Status: Normal   Collection Time   04/13/12  1:30 PM      Component Value Range Comment   aPTT 29  24 - 37 seconds   CBC     Status: Abnormal   Collection  Time   04/13/12  1:30 PM      Component Value Range Comment   WBC 18.3 (*) 4.0 - 10.5 K/uL    RBC 4.54  3.87 - 5.11 MIL/uL    Hemoglobin 14.2  12.0 - 15.0 g/dL    HCT 16.1  09.6 - 04.5 %    MCV 91.2  78.0 - 100.0 fL    MCH 31.3  26.0 - 34.0 pg    MCHC 34.3  30.0 - 36.0 g/dL    RDW 40.9  81.1 - 91.4 %    Platelets 240  150 - 400 K/uL   DIFFERENTIAL     Status: Abnormal   Collection Time   04/13/12  1:30 PM      Component Value Range Comment   Neutrophils Relative 93 (*) 43 - 77 %    Neutro Abs 17.1 (*) 1.7 - 7.7 K/uL    Lymphocytes Relative 3 (*) 12 - 46 %    Lymphs Abs 0.6 (*) 0.7 - 4.0 K/uL    Monocytes Relative 3  3 - 12 %  Monocytes Absolute 0.6  0.1 - 1.0 K/uL    Eosinophils Relative 0  0 - 5 %    Eosinophils Absolute 0.0  0.0 - 0.7 K/uL    Basophils Relative 0  0 - 1 %    Basophils Absolute 0.0  0.0 - 0.1 K/uL   COMPREHENSIVE METABOLIC PANEL     Status: Abnormal   Collection Time   04/13/12  1:30 PM      Component Value Range Comment   Sodium 130 (*) 135 - 145 mEq/L    Potassium 3.4 (*) 3.5 - 5.1 mEq/L    Chloride 95 (*) 96 - 112 mEq/L    CO2 21  19 - 32 mEq/L    Glucose, Bld 177 (*) 70 - 99 mg/dL    BUN 10  6 - 23 mg/dL    Creatinine, Ser 4.78  0.50 - 1.10 mg/dL    Calcium 9.3  8.4 - 29.5 mg/dL    Total Protein 7.7  6.0 - 8.3 g/dL    Albumin 3.9  3.5 - 5.2 g/dL    AST 25  0 - 37 U/L    ALT 20  0 - 35 U/L    Alkaline Phosphatase 73  39 - 117 U/L    Total Bilirubin 0.4  0.3 - 1.2 mg/dL    GFR calc non Af Amer 86 (*) >90 mL/min    GFR calc Af Amer >90  >90 mL/min   CK TOTAL AND CKMB     Status: Normal   Collection Time   04/13/12  1:30 PM      Component Value Range Comment   Total CK 81  7 - 177 U/L    CK, MB 3.1  0.3 - 4.0 ng/mL    Relative Index RELATIVE INDEX IS INVALID  0.0 - 2.5   TROPONIN I     Status: Normal   Collection Time   04/13/12  1:30 PM      Component Value Range Comment   Troponin I <0.30  <0.30 ng/mL   GLUCOSE, CAPILLARY     Status: Abnormal    Collection Time   04/13/12  3:33 PM      Component Value Range Comment   Glucose-Capillary 163 (*) 70 - 99 mg/dL    Comment 1 Documented in Chart      Comment 2 Notify RN     URINALYSIS, ROUTINE W REFLEX MICROSCOPIC     Status: Abnormal   Collection Time   04/13/12  4:08 PM      Component Value Range Comment   Color, Urine YELLOW  YELLOW    APPearance TURBID (*) CLEAR    Specific Gravity, Urine 1.015  1.005 - 1.030    pH 6.0  5.0 - 8.0    Glucose, UA NEGATIVE  NEGATIVE mg/dL    Hgb urine dipstick SMALL (*) NEGATIVE    Bilirubin Urine NEGATIVE  NEGATIVE    Ketones, ur 15 (*) NEGATIVE mg/dL    Protein, ur NEGATIVE  NEGATIVE mg/dL    Urobilinogen, UA 0.2  0.0 - 1.0 mg/dL    Nitrite POSITIVE (*) NEGATIVE    Leukocytes, UA MODERATE (*) NEGATIVE   URINE MICROSCOPIC-ADD ON     Status: Abnormal   Collection Time   04/13/12  4:08 PM      Component Value Range Comment   Squamous Epithelial / LPF FEW (*) RARE    WBC, UA 11-20  <3 WBC/hpf    RBC / HPF 3-6  <  3 RBC/hpf    Bacteria, UA MANY (*) RARE     Radiological Exams on Admission: *RADIOLOGY REPORT*  Clinical Data: Weakness and dizziness. Right facial numbness and  droop. History high blood pressure.  CT HEAD WITHOUT CONTRAST  Technique: Contiguous axial images were obtained from the base of  the skull through the vertex without contrast.  Comparison: 11/02/2009 MR. 10/31/2009 CT.  Findings: No intracranial hemorrhage.  Multiple remote infarcts involve the basal ganglia and thalami.  Prominent small vessel disease type changes.  No CT evidence of large acute infarct.  Global atrophy without hydrocephalus.  Vascular calcifications.  No intracranial mass lesion detected on this unenhanced exam.  Visualized sinuses and mastoid air cells are clear.  IMPRESSION:  No intracranial hemorrhage or CT evidence of large acute infarct.  Remote infarcts and small vessel disease type changes.  Vascular calcifications.    Assessment/Plan    *UTI Increased leucocytosis with positive UA suggests UTI which could be making her incrasign ly fatigued adn dizzy  admit to emdicine.  will start on IV ceftriaxone. Urine cx sent from ED and will follow   Dizziness and fatigue -Her symptoms of dizziness and fatigue have been going on for several months and is worse today  she has a documentation of BPPV in the chart and symptoms could be related to this   will check orthostatic vitals. , possibly has associated dehydration along with hyponatremia.  - IV fluids with NS -Prn meclizine  -head CT is negative for acute infarct. Given chronicity symptoms , acute stroke is unlikely. -check TSH -PT eval  Hypertension  BP stable resume home dose metoprolol  Hyperlipidemia  resume statin. Had symptoms of weakness on statins in past. CPK this admission wnl  Elevated blood glucose No hx of DM  will check A1C  Hyponatremia  continue IV fluids  Hypokalemia  replenished . Recheck in am  DVT prophylaxis: SQ lovenox  Diet: cardiac  Full code   Time Spent on Admission: 70 minutes  Jeniel Slauson 04/13/2012, 6:06 PM

## 2012-04-13 NOTE — ED Notes (Signed)
MD at bedside. 

## 2012-04-13 NOTE — ED Notes (Signed)
Rainbow drawn

## 2012-04-14 ENCOUNTER — Ambulatory Visit: Payer: Medicare Other | Admitting: Family Medicine

## 2012-04-14 DIAGNOSIS — E860 Dehydration: Secondary | ICD-10-CM | POA: Diagnosis present

## 2012-04-14 LAB — CBC
HCT: 36.8 % (ref 36.0–46.0)
Hemoglobin: 12.8 g/dL (ref 12.0–15.0)
MCH: 31.1 pg (ref 26.0–34.0)
MCHC: 34.8 g/dL (ref 30.0–36.0)
MCV: 89.5 fL (ref 78.0–100.0)
RDW: 13.8 % (ref 11.5–15.5)

## 2012-04-14 LAB — BASIC METABOLIC PANEL
BUN: 9 mg/dL (ref 6–23)
CO2: 23 mEq/L (ref 19–32)
Calcium: 8.5 mg/dL (ref 8.4–10.5)
Glucose, Bld: 124 mg/dL — ABNORMAL HIGH (ref 70–99)
Potassium: 3.6 mEq/L (ref 3.5–5.1)
Sodium: 132 mEq/L — ABNORMAL LOW (ref 135–145)

## 2012-04-14 LAB — LIPID PANEL
LDL Cholesterol: 9 mg/dL (ref 0–99)
Triglycerides: 161 mg/dL — ABNORMAL HIGH (ref <150)
VLDL: 32 mg/dL (ref 0–40)

## 2012-04-14 LAB — HEMOGLOBIN A1C: Mean Plasma Glucose: 114 mg/dL (ref <117)

## 2012-04-14 MED ORDER — POTASSIUM CHLORIDE CRYS ER 20 MEQ PO TBCR
40.0000 meq | EXTENDED_RELEASE_TABLET | Freq: Once | ORAL | Status: AC
  Administered 2012-04-14: 40 meq via ORAL
  Filled 2012-04-14: qty 2

## 2012-04-14 MED ORDER — HYDRALAZINE HCL 25 MG PO TABS
25.0000 mg | ORAL_TABLET | Freq: Four times a day (QID) | ORAL | Status: DC | PRN
  Administered 2012-04-14: 25 mg via ORAL
  Filled 2012-04-14: qty 1

## 2012-04-14 MED ORDER — ACETAMINOPHEN 325 MG PO TABS
650.0000 mg | ORAL_TABLET | Freq: Four times a day (QID) | ORAL | Status: DC | PRN
  Administered 2012-04-14 – 2012-04-16 (×3): 650 mg via ORAL
  Filled 2012-04-14 (×3): qty 2

## 2012-04-14 MED ORDER — SENNA 8.6 MG PO TABS
2.0000 | ORAL_TABLET | Freq: Once | ORAL | Status: AC
  Administered 2012-04-15: 8.6 mg via ORAL
  Filled 2012-04-14: qty 2

## 2012-04-14 NOTE — Care Management Note (Unsigned)
    Page 1 of 1   04/14/2012     1:23:18 PM   CARE MANAGEMENT NOTE 04/14/2012  Patient:  Gabriela Patel, Gabriela Patel   Account Number:  [id number]  Date Initiated:  04/14/2012  Documentation initiated by:  Lanier Clam  Subjective/Objective Assessment:   ADMITTED W/FATIGUE,WEAKNESS,DIZZINESS.HX:R SIDE INFARCT 2011.     Action/Plan:   FROM HOME   Anticipated DC Date:  04/20/2012   Anticipated DC Plan:  HOME W HOME HEALTH SERVICES      DC Planning Services  CM consult      Choice offered to / List presented to:             Status of service:  In process, will continue to follow Medicare Important Message given?   (If response is "NO", the following Medicare IM given date fields will be blank) Date Medicare IM given:   Date Additional Medicare IM given:    Discharge Disposition:    Per UR Regulation:  Reviewed for med. necessity/level of care/duration of stay  If discussed at Long Length of Stay Meetings, dates discussed:    Comments:  04/14/12 [facility] RN,BSN NCM [phone]

## 2012-04-14 NOTE — Plan of Care (Signed)
Problem: Phase I Progression Outcomes Goal: OOB as tolerated unless otherwise ordered Outcome: Progressing With assist.  Walker encouraged.

## 2012-04-14 NOTE — Progress Notes (Addendum)
Subjective: Patient denies any dizziness but still feels unsteady on walking. Has not moved out of bed much since admission.   Objective:  Vital signs in last 24 hours:  Filed Vitals:   04/13/12 1244 04/13/12 1524 04/13/12 1630 04/13/12 2042  BP: 165/66  174/61 156/79  Pulse: 80  78 86  Temp: 98.1 F (36.7 C) 98 F (36.7 C) 97.8 F (36.6 C) 98.9 F (37.2 C)  TempSrc: Oral  Oral Oral  Resp: 17  16 16   Height:    5\' 1"  (1.549 m)  Weight: 63.504 kg (140 lb)   63.504 kg (140 lb)  SpO2: 100%  96% 95%    Intake/Output from previous day:   Intake/Output Summary (Last 24 hours) at 04/14/12 0820 Last data filed at 04/13/12 2322  Gross per 24 hour  Intake      3 ml  Output      0 ml  Net      3 ml    Physical Exam:  General:elderly female in no acute distress. HEENT: no pallor, no icterus, moist oral mucosa, no JVD, no lymphadenopathy Heart: Normal  s1 &s2  Regular rate and rhythm, without murmurs, rubs, gallops. Lungs: Clear to auscultation bilaterally. Abdomen: Soft, nontender, nondistended, positive bowel sounds. Extremities: No clubbing cyanosis or edema with positive pedal pulses. Neuro: Alert, awake, oriented x3, nonfocal.   Lab Results:  Basic Metabolic Panel:    Component Value Date/Time   NA 130* 04/13/2012 1330   K 3.4* 04/13/2012 1330   CL 95* 04/13/2012 1330   CO2 21 04/13/2012 1330   BUN 10 04/13/2012 1330   CREATININE 0.52 04/13/2012 1330   GLUCOSE 177* 04/13/2012 1330   CALCIUM 9.3 04/13/2012 1330   CBC:    Component Value Date/Time   WBC 18.7* 04/14/2012 0350   HGB 12.8 04/14/2012 0350   HCT 36.8 04/14/2012 0350   PLT 189 04/14/2012 0350   MCV 89.5 04/14/2012 0350   NEUTROABS 17.1* 04/13/2012 1330   LYMPHSABS 0.6* 04/13/2012 1330   MONOABS 0.6 04/13/2012 1330   EOSABS 0.0 04/13/2012 1330   BASOSABS 0.0 04/13/2012 1330    No results found for this or any previous visit (from the past 240 hour(s)).  Studies/Results: Dg Chest 2 View  04/13/2012  *RADIOLOGY REPORT*   Clinical Data: Fatigue, dizziness, weakness, shortness of breath  CHEST - 2 VIEW  Comparison: 11/02/2009  Findings: Stable heart size and vascularity.  Aortic tortuosity evident.  No focal pneumonia, collapse, consolidation, edema, effusion, or pneumothorax.  Trachea midline.  Stable mild compression fractures of the mid to lower thoracic spine.  IMPRESSION: Stable exam.  No acute chest process.  Original Report Authenticated By: Judie Petit. Ruel Favors, M.D.   Ct Head Wo Contrast  04/13/2012  *RADIOLOGY REPORT*  Clinical Data: Weakness and dizziness.  Right facial numbness and droop.  History high blood pressure.  CT HEAD WITHOUT CONTRAST  Technique:  Contiguous axial images were obtained from the base of the skull through the vertex without contrast.  Comparison: 11/02/2009 MR.  10/31/2009 CT.  Findings: No intracranial hemorrhage.  Multiple remote infarcts involve the basal ganglia and thalami. Prominent small vessel disease type changes.  No CT evidence of large acute infarct.  Global atrophy without hydrocephalus.  Vascular calcifications.  No intracranial mass lesion detected on this unenhanced exam. Visualized sinuses and mastoid air cells are clear.  IMPRESSION: No intracranial hemorrhage or CT evidence of large acute infarct.  Remote infarcts and small vessel disease type changes.  Vascular calcifications.  Original Report Authenticated By: Fuller Canada, M.D.    Medications: Scheduled Meds:   . aspirin  81 mg Oral Daily  . cefTRIAXone (ROCEPHIN)  IV  1 g Intravenous Q24H  . enoxaparin (LOVENOX) injection  40 mg Subcutaneous Q24H  . metoprolol tartrate  25 mg Oral BID  . multivitamin with minerals  1 tablet Oral Daily  . omega-3 acid ethyl esters  2 g Oral Daily  . potassium chloride  40 mEq Oral Once  . simvastatin  20 mg Oral q1800  . sodium chloride  250 mL Intravenous Once  . sodium chloride  3 mL Intravenous Q12H  . DISCONTD: fish oil-omega-3 fatty acids  2 g Oral Daily   Continuous  Infusions:   . 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 Patient noted to have leucocytosis with UTI an  dehydration.     Dizziness and fatigue  -Her symptoms of dizziness and fatigue have been going on for several months and was worse on the day of admission which mae her come to ED. -she has a documentation of BPPV in the chart and symptoms could be related to this  - check orthostatic vitals. , possibly has associated dehydration along with hyponatremia.  - Continue IV fluids with NS  -Prn meclizine  -head CT is negative for acute infarct. Given chronicity of symptoms , acute stroke is unlikely.  -check TSH wnl. Lipid panel with mildly elevated TAG. HbA1C wnl -PT eval   Hypertension  BP stable . Continue home dose metoprolol   Hyperlipidemia  resume statin. Had symptoms of weakness on statins in past. CPK this admission wnl   Elevated blood glucose on admission No hx of DM   A1C wnl   Hyponatremia  continue IV fluids , follow repat chem 7  Hypokalemia  Replenished.  DVT prophylaxis: SQ lovenox   Diet: cardiac   Full code   DISPO: patient lives independently at thome and capable of all her ADLs and IADLs . possibly home with HHPT vs SNF pending PT eval. Likely can be discharged tomorrow if symptoms improve.     LOS: 1 day   Tyger Oka 04/14/2012, 8:20 AM   i spoke with her daughter Ms Louie Bun [phone]) who has concerns about her safety. She feels that patient has been fairly unsteady over a year. Her daughter has tried several times to get her into an assist living but patient has refused to leave her town house. Patient's daughter also feels she may not be safe driving  but patient is not very happy with it. Will decide further regarding PT eval.

## 2012-04-14 NOTE — Clinical Documentation Improvement (Signed)
GENERIC DOCUMENTATION CLARIFICATION QUERY  THIS DOCUMENT IS NOT A PERMANENT PART OF THE MEDICAL RECORD  TO RESPOND TO THE THIS QUERY, FOLLOW THE INSTRUCTIONS BELOW:  1. If needed, update documentation for the patient's encounter via the notes activity.  2. Access this query again and click edit on the In [employer].  3. After updating, or not, click F2 to complete all highlighted (required) fields concerning your review. Select "additional documentation in the medical record" OR "no additional documentation provided".  4. Click Sign note button.  5. The deficiency will fall out of your In Basket *Please let us know if you are not able to complete this workflow by phone or e-mail (listed below).  Please update your documentation within the medical record to reflect your response to this query.                                                                                        04/14/12   Dear Dr. Gonzella Lex, N or Dr. Cena Benton [facility], Re-assigned on 04/16/12  In a better effort to capture your patient's severity of illness, reflect appropriate length of stay and utilization of resources, a review of the patient medical record has revealed the following indicators.    Based on your clinical judgment, please clarify and document in a progress note and/or discharge summary the clinical condition associated with the following supporting information:  In responding to this query please exercise your independent judgment.  The fact that a query is asked, does not imply that any particular answer is desired or expected.  Pt admitted with UTI.  According to HP pt with "has some residual weakness of left LE following stroke in 2011"  Please clarify if the "residual weakness of left LE following stroke in 2011" can be further specified as one of the diagnoses listed below and document in pn or d/c summary.      Possible Clinical Conditions?  Residual Hemiparesis Residual  Hemiplegia  _______Other Condition__________________ _______Cannot Clinically Determine   Supporting Information:  Risk Factors: UTI. 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 Prominent small vessel disease type changes. Remote infarcts and small vessel disease type changes.  Treatment monitoring You may use possible, probable, or suspect with inpatient documentation. possible, probable, suspected diagnoses MUST be documented at the time of discharge  Reviewed: additional documentation in the medical record  Thank You,  Enis Slipper  RN, BSN, CCDS Clinical Documentation Specialist [facility] Pager: [phone] / E-mail: [email or web address]  Health Information Management [place]  Information provided in addendum in d/c summary

## 2012-04-14 NOTE — Evaluation (Signed)
Physical Therapy Evaluation Patient Details Name: Gabriela Patel MRN: [id number] DOB: [birth year] Today's Date: 04/14/2012 Time: 4098-1191 PT Time Calculation (min): 16 min  PT Assessment / Plan / Recommendation Clinical Impression  pt admitted w/ onset of dizziness which has resolved. Pt does present w/ decrease in Independence w/ ambulation as pt was I. PTA and driving. Pt will benefit from PT to improve balance and saftety to DC to home w/ 24/7 asssit.  Pt's daughter present and confirms caregiver availability.    PT Assessment  Patient needs continued PT services    Follow Up Recommendations  Home health PT    Barriers to Discharge        Equipment Recommendations  None recommended by PT (will borrow RW)    Recommendations for Other Services     Frequency Min 3X/week    Precautions / Restrictions Precautions Precautions: Fall   Pertinent Vitals/Pain none      Mobility  Bed Mobility Bed Mobility: Supine to Sit Supine to Sit: 5: Supervision Transfers Transfers: Sit to Stand;Stand to Sit Sit to Stand: 4: Min guard;From bed Stand to Sit: To bed;5: Supervision Ambulation/Gait Ambulation/Gait Assistance: 4: Min assist Ambulation Distance (Feet): 100 Feet Assistive device: Rolling walker;None Ambulation/Gait Assistance Details: pt ambulated x 50 ft w/RW and minguard and 50 ft w/ no AD and min assist to steady pt. Gait Pattern: Step-through pattern;Decreased stance time - left Gait velocity: decreased    Exercises     PT Diagnosis: Difficulty walking  PT Problem List: Decreased strength;Decreased balance PT Treatment Interventions: Gait training;DME instruction;Balance training   PT Goals Acute Rehab PT Goals PT Goal Formulation: With patient/family Time For Goal Achievement: 04/21/12 Pt will go Sit to Stand: with supervision PT Goal: Sit to Stand - Progress: Goal set today Pt will go Stand to Sit: Independently PT Goal: Stand to Sit - Progress: Goal set  today Pt will Ambulate: 51 - 150 feet;with supervision;with least restrictive assistive device PT Goal: Ambulate - Progress: Goal set today  Visit Information  Last PT Received On: 04/14/12 Assistance Needed: +1    Subjective Data  Subjective: i want to go home Patient Stated Goal: same   Prior Functioning  Home Living Lives With: Alone Available Help at Discharge: Family Type of Home: House Home Access: Stairs to enter Secretary/administrator of Steps: 1 Home Layout: One level Bathroom Shower/Tub: Health visitor: Standard Home Adaptive Equipment: Environmental consultant - rolling Additional Comments: will borrow RW as needed Prior Function Level of Independence: Independent Driving: Yes Vocation: Retired Musician: No difficulties    Cognition  Overall Cognitive Status: Appears within functional limits for tasks assessed/performed Arousal/Alertness: Awake/alert Orientation Level: Appears intact for tasks assessed Behavior During Session: [facility] for tasks performed    Extremity/Trunk Assessment Right Upper Extremity Assessment RUE ROM/Strength/Tone: [facility] for tasks assessed Left Upper Extremity Assessment LUE ROM/Strength/Tone: WFL for tasks assessed Right Lower Extremity Assessment RLE ROM/Strength/Tone: [facility] for tasks assessed Left Lower Extremity Assessment LLE ROM/Strength/Tone: Deficits LLE ROM/Strength/Tone Deficits: pt states L leg feels like 500 pounds on it. Trunk Assessment Trunk Assessment: Normal   Balance Static Sitting Balance Static Sitting - Balance Support: No upper extremity supported Static Sitting - Level of Assistance: 7: Independent  End of Session PT - End of Session Activity Tolerance: Patient tolerated treatment well Patient left: in bed;with call bell/phone within reach;with family/visitor present Nurse Communication: Mobility status  GP     Rada Hay 04/14/2012, 2:07 PM

## 2012-04-15 DIAGNOSIS — I10 Essential (primary) hypertension: Secondary | ICD-10-CM

## 2012-04-15 DIAGNOSIS — E871 Hypo-osmolality and hyponatremia: Secondary | ICD-10-CM

## 2012-04-15 DIAGNOSIS — K5909 Other constipation: Secondary | ICD-10-CM

## 2012-04-15 DIAGNOSIS — K5904 Chronic idiopathic constipation: Secondary | ICD-10-CM | POA: Diagnosis present

## 2012-04-15 DIAGNOSIS — D72829 Elevated white blood cell count, unspecified: Secondary | ICD-10-CM

## 2012-04-15 LAB — URINE CULTURE: Colony Count: NO GROWTH

## 2012-04-15 MED ORDER — DOCUSATE SODIUM 100 MG PO CAPS
100.0000 mg | ORAL_CAPSULE | Freq: Two times a day (BID) | ORAL | Status: DC
  Administered 2012-04-15 – 2012-04-17 (×4): 100 mg via ORAL
  Filled 2012-04-15 (×6): qty 1

## 2012-04-15 NOTE — Progress Notes (Signed)
[facility] PROGRESS NOTE  CHELISE HANGER ZOX:[id number] DOB: March 16, 1927 DOA: 04/13/2012 PCP: Loreen Freud, DO  Assessment/Plan: Principal Problem:  *UTI Active Problems:  HYPERLIPIDEMIA  VERTIGO, BENIGN PAROXYSMAL POSITION  HYPERTENSION  OSTEOARTHRITIS  OSTEOPOROSIS  WEAKNESS  CEREBROVASCULAR ACCIDENT, HX OF  Hyponatremia  Hypokalemia  Dehydration  Constipation - functional  UTI - At this point Urine culture negative - Continue Rocephin and would consider treating for 7 days - Check cbc count tomorrow - Will consider changing antibiotic regimen to levaquin if no improvement in WBC and or clinical improvement - Consider reordering urine culture.  Dizziness and fatigue  -Her symptoms of dizziness and fatigue have been going on for several months and was worse on the day of admission most likely to due deconditioning and UTI.  -she has a documentation of BPPV in the chart and suspect that this is related to this  - Orthostatics vitals was reviewed. - Continue IV fluids with NS and will have patient increase her oral intake today. -Prn meclizine  -head CT is negative for acute infarct. Given chronicity of symptoms , acute stroke is unlikely.  -check TSH wnl. Lipid panel with mildly elevated TAG. HbA1C wnl  -PT/OT eval indicates home health with home physical and occupational therapy.  Hypertension  BP stable . Continue home dose metoprolol   Hyperlipidemia  resume statin. Had symptoms of weakness on statins in past. CPK this admission wnl   Elevated blood glucose on admission  No hx of DM  A1C wnl   Hyponatremia  Most likely due to poor oral solute intake as her sodium has improved with IVF rehydration.  Will D/C IVF and encourage po intake today.  Last sodium level 132  Hypokalemia  Replenished.   Constipation Likely functional given decrease in ambulation as well as decrease oral intake.  Have encouraged patient to increase her oral intake as well as  ambulate with assistance and will add colace to her regimen.  DVT prophylaxis: SQ lovenox      Code Status: full Family Communication: Daughter at bedside and we discussed plan of care and current condition of patient. Disposition Plan: Pending improvement in WBC count.  Still elevated.  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 Patient noted to have leucocytosis with UTI an dehydration.   Consultants:  None  Procedures:  None  Antibiotics:  Day 2 of recephin  HPI/Subjective: Patient mentions that she feels a little better today.  Mentions that she has had difficulty having BM and has been straining.  Mentions that she has tried prune juice with little relief.  Last BM several days ago.  Denies any BRBPR or abdominal pain.  Objective: Filed Vitals:   04/14/12 2028 04/14/12 2330 04/15/12 0516 04/15/12 1400  BP: 146/64  155/77 134/53  Pulse: 82 84 84 86  Temp: 98.3 F (36.8 C)  97.9 F (36.6 C) 100.5 F (38.1 C)  TempSrc: Oral  Oral Oral  Resp: 16  16 16   Height:      Weight:      SpO2: 96%  95% 97%    Intake/Output Summary (Last 24 hours) at 04/15/12 1417 Last data filed at 04/15/12 1300  Gross per 24 hour  Intake 2535.5 ml  Output    825 ml  Net 1710.5 ml    Exam:  General: Alert, awake, in no  acute distress. HEENT: No bruits, no goiter. Heart: Regular rate and rhythm, without murmurs, rubs, gallops. Lungs: Clear to auscultation bilaterally. No wheezes Abdomen: Soft, nontender, nondistended, positive bowel sounds. Extremities: No clubbing cyanosis or edema with positive pedal pulses. Neuro: Grossly intact, nonfocal.  Data Reviewed: Basic Metabolic Panel:  Lab 04/14/12 2956 04/13/12 1330  NA 132* 130*  K 3.6 3.4*  CL 101 95*  CO2 23 21  GLUCOSE 124* 177*  BUN 9 10    CREATININE 0.52 0.52  CALCIUM 8.5 9.3  MG -- --  PHOS -- --   Liver Function Tests:  Lab 04/13/12 1330  AST 25  ALT 20  ALKPHOS 73  BILITOT 0.4  PROT 7.7  ALBUMIN 3.9   No results found for this basename: LIPASE:5,AMYLASE:5 in the last 168 hours No results found for this basename: AMMONIA:5 in the last 168 hours CBC:  Lab 04/14/12 0350 04/13/12 1330  WBC 18.7* 18.3*  NEUTROABS -- 17.1*  HGB 12.8 14.2  HCT 36.8 41.4  MCV 89.5 91.2  PLT 189 240   Cardiac Enzymes:  Lab 04/13/12 1330  CKTOTAL 81  CKMB 3.1  CKMBINDEX --  TROPONINI <0.30   BNP (last 3 results) No results found for this basename: PROBNP:3 in the last 8760 hours CBG:  Lab 04/13/12 1533  GLUCAP 163*    Recent Results (from the past 240 hour(s))  URINE CULTURE     Status: Normal   Collection Time   04/14/12  9:17 AM      Component Value Range Status Comment   Specimen Description URINE, CLEAN CATCH   Final    Special Requests Normal   Final    Culture  Setup Time 04/14/2012 14:09   Final    Colony Count NO GROWTH   Final    Culture NO GROWTH   Final    Report Status 04/15/2012 FINAL   Final      Studies: Dg Chest 2 View  04/13/2012  *RADIOLOGY REPORT*  Clinical Data: Fatigue, dizziness, weakness, shortness of breath  CHEST - 2 VIEW  Comparison: 11/02/2009  Findings: Stable heart size and vascularity.  Aortic tortuosity evident.  No focal pneumonia, collapse, consolidation, edema, effusion, or pneumothorax.  Trachea midline.  Stable mild compression fractures of the mid to lower thoracic spine.  IMPRESSION: Stable exam.  No acute chest process.  Original Report Authenticated By: Judie Petit. Ruel Favors, M.D.   Ct Head Wo Contrast  04/13/2012  *RADIOLOGY REPORT*  Clinical Data: Weakness and dizziness.  Right facial numbness and droop.  History high blood pressure.  CT HEAD WITHOUT CONTRAST  Technique:  Contiguous axial images were obtained from the base of the skull through the vertex without contrast.   Comparison: 11/02/2009 MR.  10/31/2009 CT.  Findings: No intracranial hemorrhage.  Multiple remote infarcts involve the basal ganglia and thalami. Prominent small vessel disease type changes.  No CT evidence of large acute infarct.  Global atrophy without hydrocephalus.  Vascular calcifications.  No intracranial mass lesion detected on this unenhanced exam. Visualized sinuses and mastoid air cells are clear.  IMPRESSION: No intracranial hemorrhage or CT evidence of large acute infarct.  Remote infarcts and small vessel disease type changes.  Vascular calcifications.  Original Report Authenticated By: Fuller Canada, M.D.    Scheduled Meds:   . aspirin  81 mg Oral Daily  . cefTRIAXone (ROCEPHIN)  IV  1 g Intravenous Q24H  . docusate sodium  100 mg Oral BID  . enoxaparin (LOVENOX) injection  40 mg Subcutaneous Q24H  . metoprolol tartrate  25 mg Oral BID  . multivitamin with minerals  1 tablet Oral Daily  . omega-3 acid ethyl esters  2 g Oral Daily  . senna  2 tablet Oral Once  . simvastatin  20 mg Oral q1800  . sodium chloride  3 mL Intravenous Q12H   Continuous Infusions:    Penny Pia [facility] Pager [phone]  If 7PM-7AM, please contact night-coverage [email or web address] Password [email or web address] 04/15/2012, 2:17 PM   LOS: 2 days

## 2012-04-15 NOTE — Evaluation (Signed)
Occupational Therapy Evaluation Patient Details Name: Gabriela Patel MRN: [id number] DOB: 1927-06-17 Today's Date: 04/15/2012 Time: 0950-1010 OT Time Calculation (min): 20 min  OT Assessment / Plan / Recommendation Clinical Impression  Pt is an 78 yo female who initially presented with dizziness which has resolved and UTI. Skilled OT indicated to maximize independence with BADLs to mod I level in prep for safe d/c home with HHOT.    OT Assessment  Patient needs continued OT Services    Follow Up Recommendations  Home health OT    Barriers to Discharge      Equipment Recommendations  None recommended by OT    Recommendations for Other Services    Frequency  Min 2X/week    Precautions / Restrictions Precautions Precautions: Fall   Pertinent Vitals/Pain     ADL  Grooming: Performed;Teeth care;Brushing hair Where Assessed - Grooming: Supported standing Upper Body Bathing: Simulated;Set up Where Assessed - Upper Body Bathing: Unsupported sitting Lower Body Bathing: Simulated;Minimal assistance Where Assessed - Lower Body Bathing: Supported sit to stand Upper Body Dressing: Simulated;Set up Where Assessed - Upper Body Dressing: Unsupported sitting Lower Body Dressing: Performed;Minimal assistance;Other (comment) (to don/doff socks) Where Assessed - Lower Body Dressing: Supported sit to stand Toilet Transfer: Research scientist (life sciences) Method: Sit to Barista: Education officer, environmental and Hygiene: Simulated;Min guard Where Assessed - Engineer, mining and Hygiene: Sit to stand from 3-in-1 or toilet Tub/Shower Transfer: Performed;Minimal assistance Tub/Shower Transfer Method: Science writer: Walk in shower Equipment Used: Rolling walker Transfers/Ambulation Related to ADLs: Pt had 1 LOB when stepping into walk in shower, even with grab bar. Therapist had to provide  stabilization. Educated pt not to step into shower without A.  ADL Comments: Pt fatigues quickly. Stated that she felt very tired after brushing her teeth and hair.    OT Diagnosis: Generalized weakness  OT Problem List: Decreased activity tolerance;Decreased safety awareness;Decreased knowledge of use of DME or AE;Impaired balance (sitting and/or standing) OT Treatment Interventions: Self-care/ADL training;Therapeutic activities;Patient/family education;DME and/or AE instruction   OT Goals Acute Rehab OT Goals OT Goal Formulation: With patient Time For Goal Achievement: 04/29/12 Potential to Achieve Goals: Good ADL Goals Pt Will Perform Grooming: with modified independence;Standing at sink ADL Goal: Grooming - Progress: Goal set today Pt Will Perform Upper Body Bathing: with modified independence;Sitting, edge of bed;Sitting, chair;Unsupported ADL Goal: Upper Body Bathing - Progress: Goal set today Pt Will Perform Lower Body Bathing: with modified independence;Sit to stand from chair;Sit to stand from bed ADL Goal: Lower Body Bathing - Progress: Goal set today Pt Will Perform Upper Body Dressing: with set-up;Sitting, bed;Sitting, chair;Unsupported ADL Goal: Upper Body Dressing - Progress: Goal set today Pt Will Perform Lower Body Dressing: with modified independence;Sit to stand from chair;Sit to stand from bed ADL Goal: Lower Body Dressing - Progress: Goal set today Pt Will Transfer to Toilet: with modified independence;Ambulation;Regular height toilet ADL Goal: Toilet Transfer - Progress: Goal set today Pt Will Perform Toileting - Clothing Manipulation: with modified independence;Standing ADL Goal: Toileting - Clothing Manipulation - Progress: Goal set today Pt Will Perform Toileting - Hygiene: with modified independence;Sit to stand from 3-in-1/toilet ADL Goal: Toileting - Hygiene - Progress: Goal set today Pt Will Perform Tub/Shower Transfer: Shower transfer;with  supervision;Ambulation ADL Goal: Tub/Shower Transfer - Progress: Goal set today  Visit Information  Last OT Received On: 04/15/12 Assistance Needed: +1    Subjective Data  Subjective: I'm going to stay with my  sister. Patient Stated Goal: Get back to going to church.   Prior Functioning  Home Living Lives With: Alone Available Help at Discharge: Available 24 hours/day;Other (Comment) (will be staying with sister.) Home Access: Stairs to enter Entrance Stairs-Number of Steps: 1 Home Layout: One level Bathroom Shower/Tub: Health visitor: Standard Home Adaptive Equipment: Walker - rolling;Bedside commode/3-in-1 Additional Comments: Can borrow from sister. Prior Function Level of Independence: Independent Driving: Yes Vocation: Retired Musician: No difficulties Dominant Hand: Right    Cognition  Overall Cognitive Status: Appears within functional limits for tasks assessed/performed Arousal/Alertness: Awake/alert Orientation Level: Appears intact for tasks assessed Behavior During Session: [facility] for tasks performed    Extremity/Trunk Assessment Right Upper Extremity Assessment RUE ROM/Strength/Tone: [facility] for tasks assessed Left Upper Extremity Assessment LUE ROM/Strength/Tone: WFL for tasks assessed   Mobility Bed Mobility Supine to Sit: 5: Supervision;HOB flat Transfers Sit to Stand: 4: Min guard;With upper extremity assist;From bed;From toilet Stand to Sit: 4: Min guard;With upper extremity assist;With armrests;To toilet;To chair/3-in-1 Details for Transfer Assistance: Max VCs for safe manipulation of RW when ambulating. Pt has tendency to abandon before reaching destination. Max cues for hand placement.   Exercise    Balance Static Sitting Balance Static Sitting - Balance Support: No upper extremity supported;Feet supported Static Sitting - Level of Assistance: 7: Independent Static Standing Balance Static Standing - Balance Support:  Left upper extremity supported Static Standing - Level of Assistance: 5: Stand by assistance  End of Session OT - End of Session Activity Tolerance: Patient limited by fatigue Patient left: in chair;with call bell/phone within reach  GO     Marge Vandermeulen A OTR/L [phone] 04/15/2012, 10:21 AM

## 2012-04-16 DIAGNOSIS — E876 Hypokalemia: Secondary | ICD-10-CM

## 2012-04-16 LAB — CBC
Hemoglobin: 13 g/dL (ref 12.0–15.0)
MCHC: 34.6 g/dL (ref 30.0–36.0)
Platelets: 173 10*3/uL (ref 150–400)

## 2012-04-16 MED ORDER — LEVOFLOXACIN IN D5W 500 MG/100ML IV SOLN
500.0000 mg | INTRAVENOUS | Status: DC
  Administered 2012-04-16: 500 mg via INTRAVENOUS
  Filled 2012-04-16 (×2): qty 100

## 2012-04-16 NOTE — Progress Notes (Signed)
[facility] PROGRESS NOTE  Gabriela Patel ZOX:[id number] DOB: 03-08-1927 DOA: 04/13/2012 PCP: Loreen Freud, DO  Assessment/Plan: Principal Problem:  *UTI Active Problems:  HYPERLIPIDEMIA  VERTIGO, BENIGN PAROXYSMAL POSITION  HYPERTENSION  OSTEOARTHRITIS  OSTEOPOROSIS  WEAKNESS  CEREBROVASCULAR ACCIDENT, HX OF  Hyponatremia  Hypokalemia  Dehydration  Constipation - functional  UTI  - At this point Urine culture negative and currently repeated and awaiting results - Given elevated WBC count despite rocephin suspect that organism responsible for infection may be resistant and as such will change antibiotic to levaquin.  Favor treating for 7 days  - Check cbc count tomorrow  - U/A on 7/08 showed turbid urine with positive nitrite, moderate leukocytes, and many bacteria.  This is most likely source of leukocytosis.  Dizziness and fatigue  -Her symptoms of dizziness and fatigue have been going on for several months and was worse on the day of admission most likely to due deconditioning and UTI.  -she has a documentation of BPPV in the chart and suspect that this is related to this  - Orthostatics vitals was reviewed.  -Will encourage patient increase her oral intake today.  -Prn meclizine  -head CT is negative for acute infarct. Given chronicity of symptoms , acute stroke is unlikely.  -check TSH wnl. Lipid panel with mildly elevated TAG. HbA1C wnl  -PT/OT eval indicates home health with home physical and occupational therapy. Family is considering SNF for rehab.  Will follow up.  Hypertension  BP stable . Continue home dose metoprolol   Hyperlipidemia  resume statin. Had symptoms of weakness on statins in past. CPK this admission wnl   Elevated blood glucose on admission  No hx of DM  A1C wnl   Hyponatremia  Most likely due to poor oral solute intake as her sodium has improved with IVF rehydration. Will D/C IVF and encourage po intake today. Last sodium level 132 .   Recheck BMP for tomorrow.  Hypokalemia  Resolved, check level tomorrow.  Constipation  Likely functional given decrease in ambulation as well as decrease oral intake. Have encouraged patient to increase her oral intake as well as ambulate with assistance and will add colace to her regimen.   DVT prophylaxis: SQ lovenox    Code Status: Full Family Communication: Spoke with daughter and patient at bedside concerning current condition and recommendations  Disposition Plan: Pending improvement in WBC count and clinical condition.  Brief narrative: Please refer to HPI  Consultants:  none  Procedures:  None awaiting u/c results  Antibiotics:  Was on Rocephin.   Will be switched to levaquin today.  HPI/Subjective: Patient reports that she does not feel worse but does not feel much better.  No acute issues reported overnight. Denies any fever, chills, nausea, dysuria. + Urgency and frequency.  Objective: Filed Vitals:   04/15/12 1630 04/15/12 2114 04/16/12 0604 04/16/12 1443  BP: 119/70 119/69 134/73 132/69  Pulse: 90 90 87 92  Temp: 98 F (36.7 C) 99.1 F (37.3 C) 99.3 F (37.4 C) 99.6 F (37.6 C)  TempSrc: Oral Oral Oral Oral  Resp: 18 24 19 18   Height: 5\' 1"  (1.549 m)     Weight: 63.504 kg (140 lb)     SpO2: 96% 93% 93% 92%    Intake/Output Summary (Last 24 hours) at 04/16/12 1806 Last data filed at 04/16/12 1452  Gross per 24 hour  Intake    940 ml  Output    600 ml  Net    340 ml  Exam:  General: Alert, awake, oriented x3, in no acute distress. HEENT: No bruits, no goiter. Heart: Regular rate and rhythm, without murmurs, rubs, gallops. Lungs: Clear to auscultation bilaterally. Abdomen: Soft, nontender, nondistended, positive bowel sounds. Extremities: No clubbing cyanosis or edema with positive pedal pulses. Neuro: Grossly intact, nonfocal.  Data Reviewed: Basic Metabolic Panel:  Lab 04/14/12 9147 04/13/12 1330  NA 132* 130*  K 3.6 3.4*  CL 101  95*  CO2 23 21  GLUCOSE 124* 177*  BUN 9 10  CREATININE 0.52 0.52  CALCIUM 8.5 9.3  MG -- --  PHOS -- --   Liver Function Tests:  Lab 04/13/12 1330  AST 25  ALT 20  ALKPHOS 73  BILITOT 0.4  PROT 7.7  ALBUMIN 3.9   No results found for this basename: LIPASE:5,AMYLASE:5 in the last 168 hours No results found for this basename: AMMONIA:5 in the last 168 hours CBC:  Lab 04/16/12 0345 04/14/12 0350 04/13/12 1330  WBC 23.3* 18.7* 18.3*  NEUTROABS -- -- 17.1*  HGB 13.0 12.8 14.2  HCT 37.6 36.8 41.4  MCV 90.6 89.5 91.2  PLT 173 189 240   Cardiac Enzymes:  Lab 04/13/12 1330  CKTOTAL 81  CKMB 3.1  CKMBINDEX --  TROPONINI <0.30   BNP (last 3 results) No results found for this basename: PROBNP:3 in the last 8760 hours CBG:  Lab 04/13/12 1533  GLUCAP 163*    Recent Results (from the past 240 hour(s))  URINE CULTURE     Status: Normal   Collection Time   04/14/12  9:17 AM      Component Value Range Status Comment   Specimen Description URINE, CLEAN CATCH   Final    Special Requests Normal   Final    Culture  Setup Time 04/14/2012 14:09   Final    Colony Count NO GROWTH   Final    Culture NO GROWTH   Final    Report Status 04/15/2012 FINAL   Final      Studies: Dg Chest 2 View  04/13/2012  *RADIOLOGY REPORT*  Clinical Data: Fatigue, dizziness, weakness, shortness of breath  CHEST - 2 VIEW  Comparison: 11/02/2009  Findings: Stable heart size and vascularity.  Aortic tortuosity evident.  No focal pneumonia, collapse, consolidation, edema, effusion, or pneumothorax.  Trachea midline.  Stable mild compression fractures of the mid to lower thoracic spine.  IMPRESSION: Stable exam.  No acute chest process.  Original Report Authenticated By: Judie Petit. Ruel Favors, M.D.   Ct Head Wo Contrast  04/13/2012  *RADIOLOGY REPORT*  Clinical Data: Weakness and dizziness.  Right facial numbness and droop.  History high blood pressure.  CT HEAD WITHOUT CONTRAST  Technique:  Contiguous axial  images were obtained from the base of the skull through the vertex without contrast.  Comparison: 11/02/2009 MR.  10/31/2009 CT.  Findings: No intracranial hemorrhage.  Multiple remote infarcts involve the basal ganglia and thalami. Prominent small vessel disease type changes.  No CT evidence of large acute infarct.  Global atrophy without hydrocephalus.  Vascular calcifications.  No intracranial mass lesion detected on this unenhanced exam. Visualized sinuses and mastoid air cells are clear.  IMPRESSION: No intracranial hemorrhage or CT evidence of large acute infarct.  Remote infarcts and small vessel disease type changes.  Vascular calcifications.  Original Report Authenticated By: Fuller Canada, M.D.    Scheduled Meds:   . aspirin  81 mg Oral Daily  . docusate sodium  100 mg Oral BID  .  enoxaparin (LOVENOX) injection  40 mg Subcutaneous Q24H  . levofloxacin (LEVAQUIN) IV  500 mg Intravenous Q24H  . metoprolol tartrate  25 mg Oral BID  . multivitamin with minerals  1 tablet Oral Daily  . omega-3 acid ethyl esters  2 g Oral Daily  . simvastatin  20 mg Oral q1800  . sodium chloride  3 mL Intravenous Q12H  . DISCONTD: cefTRIAXone (ROCEPHIN)  IV  1 g Intravenous Q24H   Continuous Infusions:    Penny Pia [facility] Pager [phone]  If 7PM-7AM, please contact night-coverage [email or web address] Password [email or web address] 04/16/2012, 6:06 PM   LOS: 3 days

## 2012-04-16 NOTE — Progress Notes (Signed)
Occupational Therapy Treatment Patient Details Name: Gabriela Patel MRN: [id number] DOB: Feb 16, 1927 Today's Date: 04/16/2012 Time: 4259-5638 OT Time Calculation (min): 23 min  OT Assessment / Plan / Recommendation Comments on Treatment Session Pt continuing to make steady progress towards goals but now feel pt would benefit from ST SNF placement. Pt's sister has health problems of her own and unsure if she would be able to provide the necessary level of A the pt would require.    Follow Up Recommendations  Skilled nursing facility    Barriers to Discharge       Equipment Recommendations  None recommended by OT    Recommendations for Other Services    Frequency Min 2X/week   Plan Discharge plan needs to be updated    Precautions / Restrictions Precautions Precautions: Fall Restrictions Weight Bearing Restrictions: No   Pertinent Vitals/Pain     ADL  Grooming: Wash/dry hands;Brushing hair;Performed;Supervision/safety Where Assessed - Grooming: Supported standing Upper Body Bathing: Performed;Set up Where Assessed - Upper Body Bathing: Unsupported sitting Lower Body Bathing: Performed;Minimal assistance Where Assessed - Lower Body Bathing: Supported sit to stand Upper Body Dressing: Performed;Set up Where Assessed - Upper Body Dressing: Unsupported sitting Lower Body Dressing: Performed;Minimal assistance (to don/doff mesh underwear.) Where Assessed - Lower Body Dressing: Supported sit to stand Toilet Transfer: Research scientist (life sciences) Method: Sit to Barista: Regular height toilet;Grab bars Toileting - Architect and Hygiene: Performed;Min guard Where Assessed - Engineer, mining and Hygiene: Sit to stand from 3-in-1 or toilet ADL Comments: Pt continues to fatigue quickly, requiring frequent RBs.    OT Diagnosis:    OT Problem List:   OT Treatment Interventions:     OT Goals ADL Goals ADL Goal:  Grooming - Progress: Progressing toward goals ADL Goal: Upper Body Bathing - Progress: Progressing toward goals ADL Goal: Lower Body Bathing - Progress: Progressing toward goals ADL Goal: Upper Body Dressing - Progress: Progressing toward goals ADL Goal: Lower Body Dressing - Progress: Progressing toward goals ADL Goal: Toilet Transfer - Progress: Progressing toward goals ADL Goal: Toileting - Clothing Manipulation - Progress: Progressing toward goals ADL Goal: Toileting - Hygiene - Progress: Progressing toward goals  Visit Information  Last OT Received On: 04/16/12 Assistance Needed: +1    Subjective Data  Subjective: I get tired so quickly   Prior Functioning       Cognition  Overall Cognitive Status: Appears within functional limits for tasks assessed/performed Arousal/Alertness: Awake/alert Orientation Level: Appears intact for tasks assessed Behavior During Session: [facility] for tasks performed    Mobility Transfers Sit to Stand: 4: Min guard;With upper extremity assist;5: Supervision;From bed;From toilet Stand to Sit: 4: Min guard;With upper extremity assist;With armrests;To chair/3-in-1 Details for Transfer Assistance: Mod VCs for hand placement and technique.   Exercises    Balance Static Sitting Balance Static Sitting - Balance Support: No upper extremity supported;Feet supported Static Sitting - Level of Assistance: 7: Independent Static Standing Balance Static Standing - Balance Support: Left upper extremity supported;During functional activity Static Standing - Level of Assistance: 5: Stand by assistance  End of Session OT - End of Session Activity Tolerance: Patient limited by fatigue Patient left: in chair;with call bell/phone within reach;with family/visitor present  GO     Nicholette Dolson A OTR/L [phone] 04/16/2012, 11:33 AM

## 2012-04-16 NOTE — Progress Notes (Signed)
Clinical Social Work Department BRIEF PSYCHOSOCIAL ASSESSMENT 04/16/2012  Patient:  MAKHIYA, COBURN     Account Number:  [id number]     Admit date:  04/13/2012  Clinical Social Worker:  Tommi Emery, CLINICAL SOCIAL WORKER  Date/Time:  04/16/2012 02:01 PM  Referred by:  Physician  Date Referred:  04/16/2012 Referred for  ALF Placement  SNF Placement  Other - See comment   Other Referral:   the family needs a little help with life management. the patient has several life situation, health and fiancianial that are causing some family stress. the family neesd support in managing through this.   Interview type:  Patient Other interview type:   CSW spoke with the daugther during the patients occupational therapy.    PSYCHOSOCIAL DATA Living Status:  ALONE Admitted from facility:   Level of care:   Primary support name:  [name] Primary support relationship to patient:  CHILD, ADULT Degree of support available:   Good; the daughter recently moved where her family back to [facility] to better take care of her mother. She is also will to come out of retirement to help her mother find a better living situation.    CURRENT CONCERNS Current Concerns  Adjustment to Illness  Financial Resources  Other - See comment   Other Concerns:   the daughter is concerned about the patients home placement options. currently she does not have the support that she would need. The patient is only able to be with her sister, however, due to age and mobility, the sister can not effectively care for her sister.    SOCIAL WORK ASSESSMENT / PLAN the family appears to needs some time of residential or skilled placement. the patient is decribed as alert and well functioning, however needs help adjusting to her phycial limitations.The family unit is experiening some worry related to how to help manage the patient. they are concerned about there financies and about being able to meet the patients needs 24 hours  7 days a week.   Assessment/plan status:  Psychosocial Support/Ongoing Assessment of Needs Other assessment/ plan:   the family is currently looking for placement in an asssited living facility, however they need help financially. they perfer to be part of a programs that does not have any kind of buy in type programs, rather a month to month rent type deal.   Information/referral to community resources:   if patient is placed in a skilled facility upon discharge, they family will look into long term placement or asssited living.    PATIENT'S/FAMILY'S RESPONSE TO PLAN OF CARE: the family is okay with help in finding placement. the family is a little weary about the degree of help they can provide. the daughter is concern about her childrens wellfair and her husband and how much they will b afffected by her mothers situation. But she working hard to work through her mothers situation.        Kayleen Memos. Leighton Ruff [phone]

## 2012-04-16 NOTE — Progress Notes (Signed)
ANTIBIOTIC CONSULT NOTE - INITIAL  Pharmacy Consult for Levaquin Indication: UTI  No Known Allergies  Patient Measurements: Height: 5\' 1"  (154.9 cm) Weight: 140 lb (63.504 kg) IBW/kg (Calculated) : 47.8    Vital Signs: Temp: 99.3 F (37.4 C) (07/11 0604) Temp src: Oral (07/11 0604) BP: 134/73 mmHg (07/11 0604) Pulse Rate: 87  (07/11 0604) I  Labs:  Basename 04/16/12 0345 04/14/12 0808 04/14/12 0350  WBC 23.3* -- 18.7*  HGB 13.0 -- 12.8  PLT 173 -- 189  LABCREA -- -- --  CREATININE -- 0.52 --   Estimated Creatinine Clearance: 45.5 ml/min (by C-G formula based on Cr of 0.52).    Microbiology: Recent Results (from the past 720 hour(s))  URINE CULTURE     Status: Normal   Collection Time   04/14/12  9:17 AM      Component Value Range Status Comment   Specimen Description URINE, CLEAN CATCH   Final    Special Requests Normal   Final    Culture  Setup Time 04/14/2012 14:09   Final    Colony Count NO GROWTH   Final    Culture NO GROWTH   Final    Report Status 04/15/2012 FINAL   Final     Medical History: Past Medical History  Diagnosis Date  . Hypertension   . Osteoporosis   . CVA (cerebral vascular accident) 11/01/2009  . Osteoarthritis     Medications:  Scheduled:    . aspirin  81 mg Oral Daily  . docusate sodium  100 mg Oral BID  . enoxaparin (LOVENOX) injection  40 mg Subcutaneous Q24H  . metoprolol tartrate  25 mg Oral BID  . multivitamin with minerals  1 tablet Oral Daily  . omega-3 acid ethyl esters  2 g Oral Daily  . simvastatin  20 mg Oral q1800  . sodium chloride  3 mL Intravenous Q12H  . DISCONTD: cefTRIAXone (ROCEPHIN)  IV  1 g Intravenous Q24H   Assessment:  78 y/o F admitted on 04/13/12 with UTI, dizziness, fatigue, weakness.  Now on D#4 empiric ceftriaxone with worsening leukocytosis.  Urine culture from 7/9: no growth.   For empiric change in antibiotic regimen today to levofloxacin.  Repeat urine culture was ordered.  Estimated CrCl just  below the 63mL/min threshold for dosage reduction.  Goal of Therapy:   Adjustment of levofloxacin for renal function  Eradication of infection  Plan:  1. Begin levofloxacin 500mg  IV q24h 2. Await urine culture results 3. Consider reducing dosage to 250mg  q24h for estimated CrCl <75mL/min; will await clinical response first.  Hope Budds, PharmD, BCPS Pager: [phone] 04/16/2012,1:38 PM

## 2012-04-17 LAB — CBC
MCV: 88.5 fL (ref 78.0–100.0)
Platelets: 175 10*3/uL (ref 150–400)
RBC: 3.93 MIL/uL (ref 3.87–5.11)
WBC: 14.2 10*3/uL — ABNORMAL HIGH (ref 4.0–10.5)

## 2012-04-17 MED ORDER — MECLIZINE HCL 12.5 MG PO TABS: 12.5000 mg | ORAL_TABLET | Freq: Three times a day (TID) | ORAL | Status: AC | PRN

## 2012-04-17 MED ORDER — LEVOFLOXACIN 500 MG PO TABS
500.0000 mg | ORAL_TABLET | Freq: Every day | ORAL | Status: DC
  Filled 2012-04-17: qty 1

## 2012-04-17 MED ORDER — LEVOFLOXACIN 500 MG PO TABS: 500.0000 mg | ORAL_TABLET | Freq: Every day | ORAL | Status: AC

## 2012-04-17 NOTE — Progress Notes (Signed)
Pleasant 77 y.o. That is alert and oriented. Pt states her appetite is not poor she just doesn't like the food. Physical therapy in to work with pt. Daughter was in to visit and she mention her mother has been on antibiotics that have not helped her. Dr. Cena Benton informed. She was started on Leviquin yesterday and her WBC has come down substantially. She is being discharged to [facility] place today. Discharge instructions explained to the pt. And her daughter. Envelope with the pt.'s information given to daughter to take to [facility] place.

## 2012-04-17 NOTE — Progress Notes (Signed)
CSW spoke with the doctor and was informed the patient was ready for discharge. CSW consulted with the patient daughter and her choice facility [facility]. A bed offer was made and accepted. The patient is ready for discharge. The plan is to D/C today to [facility], transport by EMS. The family and patient are agreeable.   Kayleen Memos. Leighton Ruff [phone]

## 2012-04-17 NOTE — Discharge Summary (Addendum)
Physician Discharge Summary  Gabriela Patel JWJ:[id number] DOB: Jul 18, 1927 DOA: 04/13/2012  PCP: Loreen Freud, DO  Admit date: 04/13/2012 Discharge date: 04/17/2012  Recommendations for Outpatient Follow-up:  1. May consider obtaining a follow up cbc as outpatient.  Patient will require physical and occupational therapy while at the SNF.  Discharge Diagnoses:  Principal Problem:  *UTI Active Problems:  HYPERLIPIDEMIA  VERTIGO, BENIGN PAROXYSMAL POSITION  HYPERTENSION  OSTEOARTHRITIS  OSTEOPOROSIS  CEREBROVASCULAR ACCIDENT, HX OF  Hyponatremia  Constipation - functional   UTI  - At this point Urine culture negative despite patient had urinalysis and clinical scenario which were consistent with UTI. - Given elevated WBC count despite rocephin suspect that organism responsible for infection may be resistant and as such antibiotic was changed to levaquin. Patient is clinically improved on this regimen and WBC trending down. I favor treating with levaquin for a total of 7 days (yesterday was first dose) - WBC down from 23.3 to 14.2 and as such patient will be able to be transitioned out of the hospital today.  Patient is agreeable at this juncture and reports that she clinically feels better. - U/A on 7/08 showed turbid urine with positive nitrite, moderate leukocytes, and many bacteria. This is most likely source of leukocytosis. Improved on levaquin.  Dizziness and fatigue  -Her symptoms of dizziness and fatigue have been going on for several months and was worse on the day of admission most likely to due deconditioning and UTI.  -she has a documentation of BPPV in the chart and suspect that this is related to this  - [facility] vitals was reviewed.  -Will encourage patient increase her oral intake today.  -Prn meclizine  -head CT is negative for acute infarct. Given chronicity of symptoms , acute stroke is unlikely.  -check TSH wnl. Lipid panel with mildly elevated TAG. HbA1C wnl    -PT/OT eval indicates SNF. Family and patient at this point prefer SNF for rehab.  - Addendum per CDI inquiry Patient also having residual weakness from stroke in 2011  Social worker has helped facilitate this process and very much want to thank Social work for their help in this particular case. Patient is to be discharge to [facility] today. .  Hypertension  BP stable . Continue home dose metoprolol   Hyperlipidemia  resume statin. Had symptoms of weakness on statins in past. CPK this admission wnl   Elevated blood glucose on admission  No hx of DM  A1C wnl   Hyponatremia  Most likely due to poor oral solute intake as her sodium has improved with IVF rehydration. Will D/C IVF and encourage po intake today. Last sodium level 132 .   Hypokalemia  Resolved, check level tomorrow.   Constipation  Likely functional given decrease in ambulation as well as decrease oral intake. Have encouraged patient to increase her oral intake as well as ambulate with assistance and will add colace to her regimen.  With this regimen patient showed improvement.   Discharge Condition: Stable with some deconditioning  Diet recommendation: Low sodium diet  History of present illness:  From original HPI: 78 y/o pleasant female with hx of rt sided infarct in 2011 with some left lower extremity residual weakness presents with dizziness and increasing fatigue x 1 day.  patient accompanied by her sister and informs that she has been increasingly fatigued with her usual household work for past several months. On reviewing her PCP note 10 months back she had symptoms of fatigue and malaise.  She informs feeling dizzy and unsteady on standing up and walking. Says she has had this symptoms for a while but today it is more severe. She denies any fever,chills, headache, blurry vision, nausea, vomiting, loss of weight or appetite. Denies any new weakness of her extremities. Denies SOB, chest pain, palpitations,  dysuria, urinary frequency or bowel symptoms.  In the ED she was noted to have leucocytosis with mild hyponatremia and hypokalemia and UA suggestive of UTI. [facility] called for admission.   Hospital Course:  Please refer to above problem list for details and problems addressed this admission.  Procedures:  None  Consultations:  None  Discharge Exam: Filed Vitals:   04/17/12 0604  BP: 126/72  Pulse: 83  Temp: 99 F (37.2 C)  Resp: 16   Filed Vitals:   04/16/12 0604 04/16/12 1443 04/16/12 2146 04/17/12 0604  BP: 134/73 132/69 134/72 126/72  Pulse: 87 92 88 83  Temp: 99.3 F (37.4 C) 99.6 F (37.6 C) 98.9 F (37.2 C) 99 F (37.2 C)  TempSrc: Oral Oral Oral Oral  Resp: 19 18 20 16   Height:      Weight:      SpO2: 93% 92% 94% 93%   General: Pt in NAD, sitting up smiling Cardiovascular: RRR, No MRG Respiratory:  Speaking in full sentences, no increased WOB Abd: Soft, nt, nd, + Bowel Sounds  Discharge Instructions  Discharge Orders    Future Orders Please Complete By Expires   Diet - low sodium heart healthy      Increase activity slowly      Discharge instructions      Comments:   Please take all antibiotics as indicated.  Also follow up with SNF primary care physician in 1-2 weeks or sooner should any new concerns arise.   Call MD for:  temperature >100.4      Call MD for:  persistant nausea and vomiting      Call MD for:  extreme fatigue      Call MD for:  redness, tenderness, or signs of infection (pain, swelling, redness, odor or green/yellow discharge around incision site)        Medication List  As of 04/17/2012 12:22 PM   TAKE these medications         aspirin 81 MG tablet   Take 81 mg by mouth daily.      CALTRATE 600 PO   Take by mouth.      fish oil-omega-3 fatty acids 1000 MG capsule   Take 2 g by mouth daily.      ibuprofen 200 MG tablet   Commonly known as: ADVIL,MOTRIN   Take 400 mg by mouth every 6 (six) hours as needed. For  pain      levofloxacin 500 MG tablet   Commonly known as: LEVAQUIN   Take 1 tablet (500 mg total) by mouth daily.      meclizine 12.5 MG tablet   Commonly known as: ANTIVERT   Take 1 tablet (12.5 mg total) by mouth 3 (three) times daily as needed for dizziness.      metoprolol tartrate 25 MG tablet   Commonly known as: LOPRESSOR   Take 1 tablet (25 mg total) by mouth 2 (two) times daily.      multivitamin with minerals Tabs   Take 1 tablet by mouth daily.      pravastatin 40 MG tablet   Commonly known as: PRAVACHOL   Take 40 mg by mouth daily.  The results of significant diagnostics from this hospitalization (including imaging, microbiology, ancillary and laboratory) are listed below for reference.    Significant Diagnostic Studies: Dg Chest 2 View  04/13/2012  *RADIOLOGY REPORT*  Clinical Data: Fatigue, dizziness, weakness, shortness of breath  CHEST - 2 VIEW  Comparison: 11/02/2009  Findings: Stable heart size and vascularity.  Aortic tortuosity evident.  No focal pneumonia, collapse, consolidation, edema, effusion, or pneumothorax.  Trachea midline.  Stable mild compression fractures of the mid to lower thoracic spine.  IMPRESSION: Stable exam.  No acute chest process.  Original Report Authenticated By: Judie Petit. Ruel Favors, M.D.   Ct Head Wo Contrast  04/13/2012  *RADIOLOGY REPORT*  Clinical Data: Weakness and dizziness.  Right facial numbness and droop.  History high blood pressure.  CT HEAD WITHOUT CONTRAST  Technique:  Contiguous axial images were obtained from the base of the skull through the vertex without contrast.  Comparison: 11/02/2009 MR.  10/31/2009 CT.  Findings: No intracranial hemorrhage.  Multiple remote infarcts involve the basal ganglia and thalami. Prominent small vessel disease type changes.  No CT evidence of large acute infarct.  Global atrophy without hydrocephalus.  Vascular calcifications.  No intracranial mass lesion detected on this unenhanced  exam. Visualized sinuses and mastoid air cells are clear.  IMPRESSION: No intracranial hemorrhage or CT evidence of large acute infarct.  Remote infarcts and small vessel disease type changes.  Vascular calcifications.  Original Report Authenticated By: Fuller Canada, M.D.    Microbiology: Recent Results (from the past 240 hour(s))  URINE CULTURE     Status: Normal   Collection Time   04/14/12  9:17 AM      Component Value Range Status Comment   Specimen Description URINE, CLEAN CATCH   Final    Special Requests Normal   Final    Culture  Setup Time 04/14/2012 14:09   Final    Colony Count NO GROWTH   Final    Culture NO GROWTH   Final    Report Status 04/15/2012 FINAL   Final      Labs: Basic Metabolic Panel:  Lab 04/14/12 1610 04/13/12 1330  NA 132* 130*  K 3.6 3.4*  CL 101 95*  CO2 23 21  GLUCOSE 124* 177*  BUN 9 10  CREATININE 0.52 0.52  CALCIUM 8.5 9.3  MG -- --  PHOS -- --   Liver Function Tests:  Lab 04/13/12 1330  AST 25  ALT 20  ALKPHOS 73  BILITOT 0.4  PROT 7.7  ALBUMIN 3.9   No results found for this basename: LIPASE:5,AMYLASE:5 in the last 168 hours No results found for this basename: AMMONIA:5 in the last 168 hours CBC:  Lab 04/17/12 1000 04/16/12 0345 04/14/12 0350 04/13/12 1330  WBC 14.2* 23.3* 18.7* 18.3*  NEUTROABS -- -- -- 17.1*  HGB 12.2 13.0 12.8 14.2  HCT 34.8* 37.6 36.8 41.4  MCV 88.5 90.6 89.5 91.2  PLT 175 173 189 240   Cardiac Enzymes:  Lab 04/13/12 1330  CKTOTAL 81  CKMB 3.1  CKMBINDEX --  TROPONINI <0.30   BNP: BNP (last 3 results) No results found for this basename: PROBNP:3 in the last 8760 hours CBG:  Lab 04/13/12 1533  GLUCAP 163*    Time coordinating discharge: > 35 minutes, medical decision making, > 15 minutes discussing results and plan with patient and family (daughter and sister)  Signed:  Penny Pia  [facility] 04/17/2012, 12:22 PM

## 2012-04-17 NOTE — Plan of Care (Signed)
Problem: Phase II Progression Outcomes Goal: Discharge plan established Outcome: Adequate for Discharge Discharged to [facility].

## 2012-04-17 NOTE — Progress Notes (Signed)
Occupational Therapy Treatment Patient Details Name: Gabriela Patel MRN: [id number] DOB: 03/20/1927 Today's Date: 04/17/2012 Time: 9629-5284 OT Time Calculation (min): 14 min  OT Assessment / Plan / Recommendation Comments on Treatment Session Pt tolerated session well. Plannning for SNF    Follow Up Recommendations  Skilled nursing facility    Barriers to Discharge       Equipment Recommendations  Defer to next venue    Recommendations for Other Services    Frequency Min 2X/week   Plan Discharge plan remains appropriate    Precautions / Restrictions Precautions Precautions: Fall        ADL  Grooming: Performed;Wash/dry hands;Brushing hair;Supervision/safety Where Assessed - Grooming: Unsupported standing Toilet Transfer: Research scientist (life sciences) Method: Other (comment) (ambulating) Acupuncturist: Comfort height toilet Toileting - Clothing Manipulation and Hygiene: Simulated;Supervision/safety Where Assessed - Toileting Clothing Manipulation and Hygiene: Sit to stand from 3-in-1 or toilet ADL Comments: Pt with better activity tolerance today. Did not complain of fatigue with tasks. Pt planning SNF and is very motivated.    OT Diagnosis:    OT Problem List:   OT Treatment Interventions:     OT Goals ADL Goals ADL Goal: Grooming - Progress: Progressing toward goals ADL Goal: Toilet Transfer - Progress: Progressing toward goals ADL Goal: Toileting - Clothing Manipulation - Progress: Progressing toward goals ADL Goal: Toileting - Hygiene - Progress: Progressing toward goals  Visit Information  Last OT Received On: 04/17/12 Assistance Needed: +1    Subjective Data  Subjective: I would love to get up some Patient Stated Goal: get back home independent   Prior Functioning       Cognition  Overall Cognitive Status: Appears within functional limits for tasks assessed/performed Arousal/Alertness: Awake/alert Orientation Level:  Appears intact for tasks assessed Behavior During Session: [facility] for tasks performed    Mobility Bed Mobility Bed Mobility: Supine to Sit Supine to Sit: 5: Supervision;HOB elevated Transfers Transfers: Sit to Stand;Stand to Sit Sit to Stand: 5: Supervision;From bed Stand to Sit: 5: Supervision;With upper extremity assist Details for Transfer Assistance: min verbal cues for hand placement   Exercises    Balance Balance Balance Assessed: Yes Dynamic Standing Balance Dynamic Standing - Level of Assistance: 5: Stand by assistance  End of Session OT - End of Session Activity Tolerance: Patient tolerated treatment well Patient left: in chair;with call bell/phone within reach  GO     Lennox Laity [phone] 04/17/2012, 12:03 PM

## 2012-04-17 NOTE — Plan of Care (Signed)
Problem: Discharge Progression Outcomes Goal: Discharge plan in place and appropriate Outcome: Adequate for Discharge Discharge  To [facility], daughter drove pt. There.

## 2012-04-21 LAB — URINE CULTURE: Colony Count: >=100000

## 2012-06-03 ENCOUNTER — Telehealth: Payer: Self-pay | Admitting: *Deleted

## 2012-06-03 NOTE — Telephone Encounter (Signed)
Verbal left on Beth's voicemail       KP

## 2012-06-03 NOTE — Telephone Encounter (Signed)
Ok to give verbal 

## 2012-06-03 NOTE — Telephone Encounter (Signed)
Caller is requesting 2-wk continuation for patient's Home Health Therapy/SLS

## 2012-06-09 ENCOUNTER — Encounter: Payer: Self-pay | Admitting: Family Medicine

## 2012-06-09 ENCOUNTER — Ambulatory Visit (INDEPENDENT_AMBULATORY_CARE_PROVIDER_SITE_OTHER): Payer: Medicare Other | Admitting: Family Medicine

## 2012-06-09 VITALS — BP 126/74 | HR 74 | Temp 99.4°F | Wt 135.8 lb

## 2012-06-09 DIAGNOSIS — I6350 Cerebral infarction due to unspecified occlusion or stenosis of unspecified cerebral artery: Secondary | ICD-10-CM

## 2012-06-09 DIAGNOSIS — I639 Cerebral infarction, unspecified: Secondary | ICD-10-CM

## 2012-06-09 DIAGNOSIS — R42 Dizziness and giddiness: Secondary | ICD-10-CM

## 2012-06-09 DIAGNOSIS — E785 Hyperlipidemia, unspecified: Secondary | ICD-10-CM

## 2012-06-09 DIAGNOSIS — H8110 Benign paroxysmal vertigo, unspecified ear: Secondary | ICD-10-CM

## 2012-06-09 DIAGNOSIS — I10 Essential (primary) hypertension: Secondary | ICD-10-CM

## 2012-06-09 MED ORDER — MECLIZINE HCL 25 MG PO TABS: 25.0000 mg | ORAL_TABLET | Freq: Three times a day (TID) | ORAL | Status: AC | PRN

## 2012-06-09 NOTE — Assessment & Plan Note (Signed)
con't meds  Check labs 

## 2012-06-09 NOTE — Patient Instructions (Addendum)
Therapy after a Stroke, Adult Your brain cells need a steady supply of blood and oxygen to work normally. A stroke comes from a sudden end of blood flow to the brain. Your brain cells may start to die within minutes after blood flow is interrupted. The loss of brain function can cause problems with speech, vision, memory, or movement. The severity of the stroke depends on which part of your brain is affected and how big an area is harmed. There are two main types of stroke:  Ischemic stroke is caused by a blockage in arteries (blood vessels carrying oxygen rich blood) supplying the brain. This is the most common kind of stroke.   Hemorrhagic stroke is occurs when a blood vessel in your brain bursts and causes bleeding into your brain.  A stroke can cause many types of problems. The treatment of stroke involves 3 stages. The 3 stages are prevention, treatment immediately following a stroke, and rehabilitation after a stroke. BEFORE THERAPY BEGINS A detailed exam by your caregiver helps outline what problems were caused by the stroke. Your caregiver may ask specialists about this. The specialists may include doctors, occupational therapists, physical therapists, and speech therapists. It is then possible to make a plan that best fits your needs. Your evaluation might include the following:  Your ability to do daily activities that require using muscles, coordination, vision, reasoning, memory, and problem solving. Interviews with you and your caregiver to understand what you could do and could not do before the stroke. Your ability to do personal self-care tasks, such as dressing, grooming, and eating.   Tests may be done to see if there are sensory and motor changes due to the stroke, especially in the hands and legs.  TREATMENT   Your caregiver may start therapy right away depending on the type and severity of your stroke.   Medicines.   If a blood clot caused the stroke, you may be started on  medicines that can help to dissolve the clot and stop new clots from forming.   If bleeding caused the stroke, medicines might be used to lower blood pressure and reverse the effects of any blood thinners used at the time of the stroke.   Therapy after stroke is focused on getting function back and preventing another stroke. Therapy might include:   Physical therapy. This can include help with walking, sitting, lying down, and balance. It may also be designed to help prevent shortening of the muscles (contractures) and swelling (edema).   Occupational therapy. This therapy helps you to relearn skills needed for leading a normal life. These could include eating, using the restroom, dressing, and taking care of yourself. It helps to make you more independent.   Vision therapy. This can help you to retrain, strengthen, and improve your vision following a stroke.   Speech therapy. This can help to improve your speech and communication skills. It also teaches both you and your family members to cope with problems of being unable to communicate.   Cognitive therapy. This therapy can help with problems caused by lack of memory, attention, or concentration.   Psychological or psychiatric therapy. This can help you cope with problems of frustration and emotional problems that may develop after a stroke.   Blood pressure control. If your blood pressure is high, it is important to lower it to a normal pressure.   Smoking cessation. If you smoke, it is important to get help to stop smoking.   Medicines to lower cholesterol (  antilipidemics) are sometimes prescribed to reduce the amount of fat (plaque) in the blood. The reduction of plaque on artery walls can decrease the risk of a future stroke.  HOME CARE INSTRUCTIONS   Follow a healthy diet as directed by your caregiver.   Monitor and control your blood sugar levels, if you are a person with diabetes.   Reduce the amount of sodium and fat in your  diet.   Quit smoking.   Exercise regularly, in moderation.   Take the necessary prescribed medicines as directed by your caregiver.   Continue your rehabilitation program as directed by your caregiver.   Get your blood pressure and cholesterol levels checked regularly.  SEEK MEDICAL CARE IF:   You feel you are having problems with medicines prescribed.   You are not improving as expected.  SEEK IMMEDIATE MEDICAL CARE IF:   You have sudden weakness or numbness of the face, arm, or leg, especially on one side of the body.   You have sudden confusion.   You have trouble speaking (aphasia) or understanding.   You have sudden trouble seeing in 1 or both eyes.   You have sudden trouble walking.   You have dizziness.   You have a loss of balance or coordination.   You have a sudden severe headache with no known cause.   You have an oral temperature above 102 F (38.9 C), not controlled by medicine.   You are coughing or have difficulty breathing.   You have new chest pain, angina, or an irregular heartbeat.  ANY OF THESE SYMPTOMS MAY REPRESENT A SERIOUS PROBLEM THAT IS AN EMERGENCY. Do not wait to see if the symptoms will go away. Get medical help at once. Call your local emergency services (911 in the [place] DO NOT drive yourself to the hospital. Document Released: 10/13/2007 Document Revised: 09/12/2011 Document Reviewed: 10/13/2007 [employer] Patient Information 2012 [employer]. 

## 2012-06-09 NOTE — Assessment & Plan Note (Signed)
Stable con't meds 

## 2012-06-09 NOTE — Assessment & Plan Note (Signed)
Using meclizine very rarely Much better

## 2012-06-09 NOTE — Assessment & Plan Note (Signed)
Pt doing well with pt Good strength 1 more week pT

## 2012-06-09 NOTE — Progress Notes (Signed)
  Subjective:    Patient ID: Gabriela Patel, female    DOB: March 14, 1927, 78 y.o.   MRN: [id number]  HPI Pt here f/u ER in July.  Pt was feeling weak in L leg and heaviness.  She still sees Dr Pearlean Brownie , neuro and has 1 more week of PT.       Review of Systems     Objective:   Physical Exam        Assessment & Plan:

## 2012-07-22 ENCOUNTER — Encounter: Payer: Self-pay | Admitting: Family Medicine

## 2012-07-22 ENCOUNTER — Ambulatory Visit (INDEPENDENT_AMBULATORY_CARE_PROVIDER_SITE_OTHER): Payer: Medicare Other | Admitting: Family Medicine

## 2012-07-22 ENCOUNTER — Telehealth: Payer: Self-pay | Admitting: Family Medicine

## 2012-07-22 VITALS — BP 126/75 | HR 74 | Temp 98.8°F | Ht 59.5 in | Wt 138.2 lb

## 2012-07-22 DIAGNOSIS — M1990 Unspecified osteoarthritis, unspecified site: Secondary | ICD-10-CM

## 2012-07-22 NOTE — Progress Notes (Signed)
  Subjective:    Patient ID: Gabriela Patel, female    DOB: 1927/02/10, 78 y.o.   MRN: [id number]  HPI Pt reports that since her CVA over a year ago, 'i really don't feel like moving'.  A couple of months ago had overwhelming fatigue, 'it felt like breathing was more than i wanted to do'.  Went to [facility] ER and was dx'd w/ UTI and hospitalized for 1 week.  Went to [facility] for rehab due to deconditioning.  After d/c from rehab was receiving home health PT.  Completed this recently.  For the past week has had intermittent weakness of R leg.  'it would want to fold up'.  Using cane for support.  Today denies symptoms.  Has taken advil/aleve w/ some relief of sxs.  Also has hx of hyponatremia and hypokalemia.  Feels that statin is 'making me out of sorts'.   Review of Systems For ROS see HPI     Objective:   Physical Exam  Vitals reviewed. Constitutional: She is oriented to person, place, and time. She appears well-developed and well-nourished. No distress.  HENT:  Head: Normocephalic and atraumatic.  Neck: Neck supple.  Cardiovascular: Normal rate, regular rhythm, normal heart sounds and intact distal pulses.   Pulmonary/Chest: Effort normal and breath sounds normal. No respiratory distress. She has no wheezes. She has no rales.  Musculoskeletal: She exhibits no edema.       Obvious bony enlargement/deformity of R knee + crepitus w/ flexion/extension  Lymphadenopathy:    She has no cervical adenopathy.  Neurological: She is alert and oriented to person, place, and time. Coordination (residual L sided deficits from CVA) abnormal.       No weakness of R leg noted  Skin: Skin is warm and dry.          Assessment & Plan:

## 2012-07-22 NOTE — Patient Instructions (Addendum)
Your leg pain and weakness is likely due to the arthritis in your knee Start Tylenol Arthritis for pain, adding Advil or Aleve as needed We'll contact advanced home care and restart PT Call with any questions or concerns Hang in there!!!  You're doing great!

## 2012-07-22 NOTE — Telephone Encounter (Signed)
Patient called back and indicated her sister-n-law will bring her in today at 3:00.

## 2012-07-22 NOTE — Telephone Encounter (Signed)
Spoke with patient, patient offered to be seen today at 3:00 pm by Dr.Tabori. Patient will call her sister and call us back

## 2012-07-22 NOTE — Telephone Encounter (Signed)
Caller: Chrisandra/Patient; Patient Name: Gabriela Patel; PCP: Lelon Perla.; Best Callback Phone Number: [phone].  Pt reports for approx 1 week her right leg is "giving out on her".  Pt has not fallen but she is worried she will fall.  Pt denies any pain or numbness/tingling in extremity.  Pt has history of CVA in 2012 with left side deficits.  Triaged patient per Leg Non-Injury Protocol.  See Provider within 24 hours Disposition for 'Sensations of numbness, tingling, extreme sensitivity, shooting or burning discomfort that occurs with movement and stops with rest and not previously evaluated'.  Home care advice and call back parameters given per protocol.  Appt scheduled for 07/23/12 at 11:30 with Dr Beverely Low.  (Dr Laury Axon was not available)

## 2012-07-23 ENCOUNTER — Ambulatory Visit: Payer: Medicare Other | Admitting: Family Medicine

## 2012-08-03 ENCOUNTER — Telehealth: Payer: Self-pay | Admitting: Family Medicine

## 2012-08-03 MED ORDER — METOPROLOL TARTRATE 25 MG PO TABS: 25.0000 mg | ORAL_TABLET | Freq: Two times a day (BID) | ORAL | Status: DC

## 2012-08-03 NOTE — Telephone Encounter (Signed)
Refill: Metoprolol tartrate 25 mg tab. Take 1 tablet by mouth 2 times daily. Qty 180. Last fill 03-30-12

## 2012-08-09 NOTE — Assessment & Plan Note (Signed)
New to provider.  Pt w/ obvious bony enlargement/deformity of R knee and this is coupled w/ pt's inability to support herself on L.  Pt is likely overstressing R side, leading to sensation of weakness. Restart PT for strength and endurance training.  Tylenol prn.  Reviewed supportive care and red flags that should prompt return.  Pt expressed understanding and is in agreement w/ plan.

## 2012-08-23 DIAGNOSIS — R413 Other amnesia: Secondary | ICD-10-CM

## 2012-08-23 DIAGNOSIS — M159 Polyosteoarthritis, unspecified: Secondary | ICD-10-CM

## 2012-08-23 DIAGNOSIS — I10 Essential (primary) hypertension: Secondary | ICD-10-CM

## 2012-08-23 DIAGNOSIS — R32 Unspecified urinary incontinence: Secondary | ICD-10-CM

## 2012-08-23 DIAGNOSIS — R42 Dizziness and giddiness: Secondary | ICD-10-CM

## 2012-08-24 ENCOUNTER — Encounter: Payer: Self-pay | Admitting: Family Medicine

## 2012-08-24 ENCOUNTER — Ambulatory Visit (INDEPENDENT_AMBULATORY_CARE_PROVIDER_SITE_OTHER): Payer: Medicare Other | Admitting: Family Medicine

## 2012-08-24 VITALS — BP 128/70 | HR 79 | Temp 98.5°F | Ht 60.0 in | Wt 135.0 lb

## 2012-08-24 DIAGNOSIS — R739 Hyperglycemia, unspecified: Secondary | ICD-10-CM

## 2012-08-24 DIAGNOSIS — Z8679 Personal history of other diseases of the circulatory system: Secondary | ICD-10-CM

## 2012-08-24 DIAGNOSIS — N390 Urinary tract infection, site not specified: Secondary | ICD-10-CM

## 2012-08-24 DIAGNOSIS — R7309 Other abnormal glucose: Secondary | ICD-10-CM

## 2012-08-24 DIAGNOSIS — Z0000 Encounter for general adult medical examination without abnormal findings: Secondary | ICD-10-CM

## 2012-08-24 DIAGNOSIS — I10 Essential (primary) hypertension: Secondary | ICD-10-CM

## 2012-08-24 DIAGNOSIS — Z23 Encounter for immunization: Secondary | ICD-10-CM

## 2012-08-24 DIAGNOSIS — E785 Hyperlipidemia, unspecified: Secondary | ICD-10-CM

## 2012-08-24 DIAGNOSIS — M8580 Other specified disorders of bone density and structure, unspecified site: Secondary | ICD-10-CM

## 2012-08-24 LAB — BASIC METABOLIC PANEL
CO2: 26 mEq/L (ref 19–32)
Chloride: 102 mEq/L (ref 96–112)
Creatinine, Ser: 0.7 mg/dL (ref 0.4–1.2)
Potassium: 3.9 mEq/L (ref 3.5–5.1)
Sodium: 134 mEq/L — ABNORMAL LOW (ref 135–145)

## 2012-08-24 LAB — CBC WITH DIFFERENTIAL/PLATELET
Basophils Relative: 0.5 % (ref 0.0–3.0)
Eosinophils Absolute: 0.1 10*3/uL (ref 0.0–0.7)
Eosinophils Relative: 1.2 % (ref 0.0–5.0)
HCT: 40.1 % (ref 36.0–46.0)
Hemoglobin: 12.9 g/dL (ref 12.0–15.0)
Lymphs Abs: 1.7 10*3/uL (ref 0.7–4.0)
MCHC: 32.3 g/dL (ref 30.0–36.0)
MCV: 94.9 fl (ref 78.0–100.0)
Monocytes Absolute: 0.5 10*3/uL (ref 0.1–1.0)
Neutro Abs: 7.2 10*3/uL (ref 1.4–7.7)
RBC: 4.23 Mil/uL (ref 3.87–5.11)
WBC: 9.6 10*3/uL (ref 4.5–10.5)

## 2012-08-24 LAB — HEPATIC FUNCTION PANEL
ALT: 17 U/L (ref 0–35)
Albumin: 4 g/dL (ref 3.5–5.2)
Total Protein: 7.6 g/dL (ref 6.0–8.3)

## 2012-08-24 LAB — LIPID PANEL
HDL: 87.7 mg/dL (ref >39.00)
Total CHOL/HDL Ratio: 2
Triglycerides: 66 mg/dL (ref 0.0–149.0)

## 2012-08-24 NOTE — Progress Notes (Signed)
Subjective:    Gabriela Patel is a 78 y.o. female who presents for Medicare Annual/Subsequent preventive examination.  Preventive Screening-Counseling & Management  Tobacco History  Smoking status  . Never Smoker   Smokeless tobacco  . Never Used     Problems Prior to Visit 1.   Current Problems (verified) Patient Active Problem List  Diagnosis  . HYPERLIPIDEMIA  . VERTIGO, BENIGN PAROXYSMAL POSITION  . HYPERTENSION  . LACUNAR INFARCTION  . INTERMITTENT CLAUDICATION, LEFT LEG  . UTI  . OSTEOARTHRITIS  . HIP PAIN, LEFT  . LOW BACK PAIN, MILD  . MYALGIA  . LEG PAIN, LEFT  . OSTEOPOROSIS  . COMPRESSION FRACTURE  . CEREBROVASCULAR ACCIDENT, HX OF  . COLONOSCOPY, HX OF  . Hyponatremia  . Constipation - functional    Medications Prior to Visit Current Outpatient Prescriptions on File Prior to Visit  Medication Sig Dispense Refill  . aspirin 81 MG tablet Take 81 mg by mouth daily.        . Calcium Carbonate (CALTRATE 600 PO) Take by mouth.      . fish oil-omega-3 fatty acids 1000 MG capsule Take 2 g by mouth daily.      Marland Kitchen ibuprofen (ADVIL,MOTRIN) 200 MG tablet Take 400 mg by mouth every 6 (six) hours as needed. For pain      . metoprolol tartrate (LOPRESSOR) 25 MG tablet Take 1 tablet (25 mg total) by mouth 2 (two) times daily.  180 tablet  1  . Multiple Vitamin (MULTIVITAMIN WITH MINERALS) TABS Take 1 tablet by mouth daily.      . pravastatin (PRAVACHOL) 40 MG tablet Take 40 mg by mouth daily.        Current Medications (verified) Current Outpatient Prescriptions  Medication Sig Dispense Refill  . aspirin 81 MG tablet Take 81 mg by mouth daily.        . Calcium Carbonate (CALTRATE 600 PO) Take by mouth.      . fish oil-omega-3 fatty acids 1000 MG capsule Take 2 g by mouth daily.      Marland Kitchen ibuprofen (ADVIL,MOTRIN) 200 MG tablet Take 400 mg by mouth every 6 (six) hours as needed. For pain      . metoprolol tartrate (LOPRESSOR) 25 MG tablet Take 1 tablet (25 mg total)  by mouth 2 (two) times daily.  180 tablet  1  . Multiple Vitamin (MULTIVITAMIN WITH MINERALS) TABS Take 1 tablet by mouth daily.      . pravastatin (PRAVACHOL) 40 MG tablet Take 40 mg by mouth daily.         Allergies (verified) Review of patient's allergies indicates no known allergies.   PAST HISTORY  Family History Family History  Problem Relation Age of Onset  . Coronary artery disease    . Hip fracture Mother     B/L    Social History History  Substance Use Topics  . Smoking status: Never Smoker   . Smokeless tobacco: Never Used  . Alcohol Use: No     Are there smokers in your home (other than you)? No  Risk Factors Current exercise habits: Home exercise routine includes PT at house.  Dietary issues discussed: na   Cardiac risk factors: advanced age (older than 74 for men, 33 for women), dyslipidemia, hypertension and sedentary lifestyle.  Depression Screen (Note: if answer to either of the following is "Yes", a more complete depression screening is indicated)   Over the past two weeks, have you felt down, depressed or hopeless?  No  Over the past two weeks, have you felt little interest or pleasure in doing things? No  Have you lost interest or pleasure in daily life? No  Do you often feel hopeless? No  Do you cry easily over simple problems? No  Activities of Daily Living In your present state of health, do you have any difficulty performing the following activities?:  Driving? No Managing money?  No Feeding yourself? No Getting from bed to chair? No Climbing a flight of stairs? No Preparing food and eating?: No Bathing or showering? No Getting dressed: No Getting to the toilet? No Using the toilet:No Moving around from place to place: No In the past year have you fallen or had a near fall?:No   Are you sexually active?  No  Do you have more than one partner?  No  Hearing Difficulties: No Do you often ask people to speak up or repeat themselves?  No Do you experience ringing or noises in your ears? No Do you have difficulty understanding soft or whispered voices? No   Do you feel that you have a problem with memory? No  Do you often misplace items? No  Do you feel safe at home?  Yes  Cognitive Testing  Alert? Yes  Normal Appearance?Yes  Oriented to person? Yes  Place? Yes   Time? Yes  Recall of three objects?  Yes  Can perform simple calculations? Yes  Displays appropriate judgment?Yes  Can read the correct time from a watch face?Yes   Advanced Directives have been discussed with the patient? Yes  List the Names of Other Physician/Practitioners you currently use: 1.  oph--hecker 2  Dentist-- Elinor Parkinson any recent Medical Services you may have received from other than [facility] providers in the past year (date may be approximate).  Immunization History  Administered Date(s) Administered  . Influenza Split 07/17/2011, 08/24/2012  . Influenza Whole 07/27/2007, 07/11/2008, 08/11/2009, 07/25/2010  . Pneumococcal Polysaccharide 05/19/1974  . Zoster 03/10/2007    Screening Tests Health Maintenance  Topic Date Due  . Tetanus/tdap  01/24/1948  . Colonoscopy  01/24/1979  . Pneumococcal Polysaccharide Vaccine Age 52 And Over  01/23/1994  . Influenza Vaccine  06/07/2012  . Mammogram  08/06/2013  . Zostavax  Completed    All answers were reviewed with the patient and necessary referrals were made:  Loreen Freud, DO   08/24/2012   History reviewed:  She  has a past medical history of Hypertension; Osteoporosis; CVA (cerebral vascular accident) (11/01/2009); Osteoarthritis; and Hyperlipidemia. She  does not have any pertinent problems on file. She  has past surgical history that includes Abdominal hysterectomy; Cataract extraction; and Vein ligation. Her family history includes Coronary artery disease in an unspecified family member and Hip fracture in her mother. She  reports that she has never smoked. She has never used  smokeless tobacco. She reports that she does not drink alcohol or use illicit drugs. She has a current medication list which includes the following prescription(s): aspirin, calcium carbonate, fish oil-omega-3 fatty acids, ibuprofen, metoprolol tartrate, multivitamin with minerals, and pravastatin. Current Outpatient Prescriptions on File Prior to Visit  Medication Sig Dispense Refill  . aspirin 81 MG tablet Take 81 mg by mouth daily.        . Calcium Carbonate (CALTRATE 600 PO) Take by mouth.      . fish oil-omega-3 fatty acids 1000 MG capsule Take 2 g by mouth daily.      Marland Kitchen ibuprofen (ADVIL,MOTRIN) 200 MG tablet  Take 400 mg by mouth every 6 (six) hours as needed. For pain      . metoprolol tartrate (LOPRESSOR) 25 MG tablet Take 1 tablet (25 mg total) by mouth 2 (two) times daily.  180 tablet  1  . Multiple Vitamin (MULTIVITAMIN WITH MINERALS) TABS Take 1 tablet by mouth daily.      . pravastatin (PRAVACHOL) 40 MG tablet Take 40 mg by mouth daily.       She  has no known allergies.  Review of Systems  Review of Systems  Constitutional: Negative for activity change, appetite change and fatigue.  HENT: Negative for hearing loss, congestion, tinnitus and ear discharge.   Eyes: Negative for visual disturbance (see optho q1y -- ).  Respiratory: Negative for cough, chest tightness and shortness of breath.   Cardiovascular: Negative for chest pain, palpitations and leg swelling.  Gastrointestinal: Negative for abdominal pain, diarrhea, constipation and abdominal distention.  Genitourinary: Negative for urgency, frequency, decreased urine volume and difficulty urinating.  Musculoskeletal: Negative for back pain, arthralgias and gait problem.  Skin: Negative for color change, pallor and rash.  Neurological: Negative for dizziness, light-headedness, numbness and headaches.  Hematological: Negative for adenopathy. Does not bruise/bleed easily.  Psychiatric/Behavioral: Negative for suicidal ideas,  confusion, sleep disturbance, self-injury, dysphoric mood, decreased concentration and agitation.  Pt is able to read and write and can do all ADLs No risk for falling No abuse/ violence in home     Objective:     Vision by Snellen chart: opth  Body mass index is 26.37 kg/(m^2). BP 128/70  Pulse 79  Temp 98.5 F (36.9 C) (Oral)  Ht 5' (1.524 m)  Wt 135 lb (61.236 kg)  BMI 26.37 kg/m2  SpO2 96%  BP 128/70  Pulse 79  Temp 98.5 F (36.9 C) (Oral)  Ht 5' (1.524 m)  Wt 135 lb (61.236 kg)  BMI 26.37 kg/m2  SpO2 96% General appearance: alert, cooperative and appears stated age Head: Normocephalic, without obvious abnormality, atraumatic Eyes: negative findings: lids and lashes normal and pupils equal, round, reactive to light and accomodation Ears: normal TM's and external ear canals both ears Nose: Nares normal. Septum midline. Mucosa normal. No drainage or sinus tenderness. Throat: lips, mucosa, and tongue normal; teeth and gums normal Neck: no adenopathy, no carotid bruit, no JVD, supple, symmetrical, trachea midline and thyroid not enlarged, symmetric, no tenderness/mass/nodules Back: symmetric, no curvature. ROM normal. No CVA tenderness. Lungs: clear to auscultation bilaterally Breasts: normal appearance, no masses or tenderness Heart: S1, S2 normal Abdomen: soft, non-tender; bowel sounds normal; no masses,  no organomegaly Pelvic: not indicated; post-menopausal, no abnormal Pap smears in past Extremities: extremities normal, atraumatic, no cyanosis or edema Pulses: 2+ and symmetric Skin: Skin color, texture, turgor normal. No rashes or lesions Lymph nodes: Cervical, supraclavicular, and axillary nodes normal. Neurologic: Alert and oriented X 3, normal strength and tone. Normal symmetric reflexes. Normal coordination and gait Psych-- no depression, no anxiety     Assessment:     cpe     Plan:     During the course of the visit the patient was educated and  counseled about appropriate screening and preventive services including:    Pneumococcal vaccine   Influenza vaccine  Screening mammography  Screening Pap smear and pelvic exam   Bone densitometry screening  Advanced directives: has an advanced directive - a copy HAS NOT been provided.  Diet review for nutrition referral? Yes ____  Not Indicated _x___   Patient Instructions (  the written plan) was given to the patient.  Medicare Attestation I have personally reviewed: The patient's medical and social history Their use of alcohol, tobacco or illicit drugs Their current medications and supplements The patient's functional ability including ADLs,fall risks, home safety risks, cognitive, and hearing and visual impairment Diet and physical activities Evidence for depression or mood disorders  The patient's weight, height, BMI, and visual acuity have been recorded in the chart.  I have made referrals, counseling, and provided education to the patient based on review of the above and I have provided the patient with a written personalized care plan for preventive services.     Loreen Freud, DO   08/24/2012

## 2012-08-24 NOTE — Assessment & Plan Note (Signed)
Due for bmd

## 2012-08-24 NOTE — Assessment & Plan Note (Signed)
Stable con't meds 

## 2012-08-24 NOTE — Patient Instructions (Addendum)
Preventive Care for Adults, Female A healthy lifestyle and preventive care can promote health and wellness. Preventive health guidelines for women include the following key practices.  A routine yearly physical is a good way to check with your caregiver about your health and preventive screening. It is a chance to share any concerns and updates on your health, and to receive a thorough exam.  Visit your dentist for a routine exam and preventive care every 6 months. Brush your teeth twice a day and floss once a day. Good oral hygiene prevents tooth decay and gum disease.  The frequency of eye exams is based on your age, health, family medical history, use of contact lenses, and other factors. Follow your caregiver's recommendations for frequency of eye exams.  Eat a healthy diet. Foods like vegetables, fruits, whole grains, low-fat dairy products, and lean protein foods contain the nutrients you need without too many calories. Decrease your intake of foods high in solid fats, added sugars, and salt. Eat the right amount of calories for you.Get information about a proper diet from your caregiver, if necessary.  Regular physical exercise is one of the most important things you can do for your health. Most adults should get at least 150 minutes of moderate-intensity exercise (any activity that increases your heart rate and causes you to sweat) each week. In addition, most adults need muscle-strengthening exercises on 2 or more days a week.  Maintain a healthy weight. The body mass index (BMI) is a screening tool to identify possible weight problems. It provides an estimate of body fat based on height and weight. Your caregiver can help determine your BMI, and can help you achieve or maintain a healthy weight.For adults 20 years and older:  A BMI below 18.5 is considered underweight.  A BMI of 18.5 to 24.9 is normal.  A BMI of 25 to 29.9 is considered overweight.  A BMI of 30 and above is  considered obese.  Maintain normal blood lipids and cholesterol levels by exercising and minimizing your intake of saturated fat. Eat a balanced diet with plenty of fruit and vegetables. Blood tests for lipids and cholesterol should begin at age 20 and be repeated every 5 years. If your lipid or cholesterol levels are high, you are over 50, or you are at high risk for heart disease, you may need your cholesterol levels checked more frequently.Ongoing high lipid and cholesterol levels should be treated with medicines if diet and exercise are not effective.  If you smoke, find out from your caregiver how to quit. If you do not use tobacco, do not start.  If you are pregnant, do not drink alcohol. If you are breastfeeding, be very cautious about drinking alcohol. If you are not pregnant and choose to drink alcohol, do not exceed 1 drink per day. One drink is considered to be 12 ounces (355 mL) of beer, 5 ounces (148 mL) of wine, or 1.5 ounces (44 mL) of liquor.  Avoid use of street drugs. Do not share needles with anyone. Ask for help if you need support or instructions about stopping the use of drugs.  High blood pressure causes heart disease and increases the risk of stroke. Your blood pressure should be checked at least every 1 to 2 years. Ongoing high blood pressure should be treated with medicines if weight loss and exercise are not effective.  If you are 55 to 76 years old, ask your caregiver if you should take aspirin to prevent strokes.  Diabetes   screening involves taking a blood sample to check your fasting blood sugar level. This should be done once every 3 years, after age 45, if you are within normal weight and without risk factors for diabetes. Testing should be considered at a younger age or be carried out more frequently if you are overweight and have at least 1 risk factor for diabetes.  Breast cancer screening is essential preventive care for women. You should practice "breast  self-awareness." This means understanding the normal appearance and feel of your breasts and may include breast self-examination. Any changes detected, no matter how small, should be reported to a caregiver. Women in their 20s and 30s should have a clinical breast exam (CBE) by a caregiver as part of a regular health exam every 1 to 3 years. After age 40, women should have a CBE every year. Starting at age 40, women should consider having a mammography (breast X-ray test) every year. Women who have a family history of breast cancer should talk to their caregiver about genetic screening. Women at a high risk of breast cancer should talk to their caregivers about having magnetic resonance imaging (MRI) and a mammography every year.  The Pap test is a screening test for cervical cancer. A Pap test can show cell changes on the cervix that might become cervical cancer if left untreated. A Pap test is a procedure in which cells are obtained and examined from the lower end of the uterus (cervix).  Women should have a Pap test starting at age 21.  Between ages 21 and 29, Pap tests should be repeated every 2 years.  Beginning at age 30, you should have a Pap test every 3 years as long as the past 3 Pap tests have been normal.  Some women have medical problems that increase the chance of getting cervical cancer. Talk to your caregiver about these problems. It is especially important to talk to your caregiver if a new problem develops soon after your last Pap test. In these cases, your caregiver may recommend more frequent screening and Pap tests.  The above recommendations are the same for women who have or have not gotten the vaccine for human papillomavirus (HPV).  If you had a hysterectomy for a problem that was not cancer or a condition that could lead to cancer, then you no longer need Pap tests. Even if you no longer need a Pap test, a regular exam is a good idea to make sure no other problems are  starting.  If you are between ages 65 and 70, and you have had normal Pap tests going back 10 years, you no longer need Pap tests. Even if you no longer need a Pap test, a regular exam is a good idea to make sure no other problems are starting.  If you have had past treatment for cervical cancer or a condition that could lead to cancer, you need Pap tests and screening for cancer for at least 20 years after your treatment.  If Pap tests have been discontinued, risk factors (such as a new sexual partner) need to be reassessed to determine if screening should be resumed.  The HPV test is an additional test that may be used for cervical cancer screening. The HPV test looks for the virus that can cause the cell changes on the cervix. The cells collected during the Pap test can be tested for HPV. The HPV test could be used to screen women aged 30 years and older, and should   be used in women of any age who have unclear Pap test results. After the age of 30, women should have HPV testing at the same frequency as a Pap test.  Colorectal cancer can be detected and often prevented. Most routine colorectal cancer screening begins at the age of 50 and continues through age 75. However, your caregiver may recommend screening at an earlier age if you have risk factors for colon cancer. On a yearly basis, your caregiver may provide home test kits to check for hidden blood in the stool. Use of a small camera at the end of a tube, to directly examine the colon (sigmoidoscopy or colonoscopy), can detect the earliest forms of colorectal cancer. Talk to your caregiver about this at age 50, when routine screening begins. Direct examination of the colon should be repeated every 5 to 10 years through age 75, unless early forms of pre-cancerous polyps or small growths are found.  Hepatitis C blood testing is recommended for all people born from 1945 through 1965 and any individual with known risks for hepatitis C.  Practice  safe sex. Use condoms and avoid high-risk sexual practices to reduce the spread of sexually transmitted infections (STIs). STIs include gonorrhea, chlamydia, syphilis, trichomonas, herpes, HPV, and human immunodeficiency virus (HIV). Herpes, HIV, and HPV are viral illnesses that have no cure. They can result in disability, cancer, and death. Sexually active women aged 25 and younger should be checked for chlamydia. Older women with new or multiple partners should also be tested for chlamydia. Testing for other STIs is recommended if you are sexually active and at increased risk.  Osteoporosis is a disease in which the bones lose minerals and strength with aging. This can result in serious bone fractures. The risk of osteoporosis can be identified using a bone density scan. Women ages 65 and over and women at risk for fractures or osteoporosis should discuss screening with their caregivers. Ask your caregiver whether you should take a calcium supplement or vitamin D to reduce the rate of osteoporosis.  Menopause can be associated with physical symptoms and risks. Hormone replacement therapy is available to decrease symptoms and risks. You should talk to your caregiver about whether hormone replacement therapy is right for you.  Use sunscreen with sun protection factor (SPF) of 30 or more. Apply sunscreen liberally and repeatedly throughout the day. You should seek shade when your shadow is shorter than you. Protect yourself by wearing long sleeves, pants, a wide-brimmed hat, and sunglasses year round, whenever you are outdoors.  Once a month, do a whole body skin exam, using a mirror to look at the skin on your back. Notify your caregiver of new moles, moles that have irregular borders, moles that are larger than a pencil eraser, or moles that have changed in shape or color.  Stay current with required immunizations.  Influenza. You need a dose every fall (or winter). The composition of the flu vaccine  changes each year, so being vaccinated once is not enough.  Pneumococcal polysaccharide. You need 1 to 2 doses if you smoke cigarettes or if you have certain chronic medical conditions. You need 1 dose at age 65 (or older) if you have never been vaccinated.  Tetanus, diphtheria, pertussis (Tdap, Td). Get 1 dose of Tdap vaccine if you are younger than age 65, are over 65 and have contact with an infant, are a healthcare worker, are pregnant, or simply want to be protected from whooping cough. After that, you need a Td   booster dose every 10 years. Consult your caregiver if you have not had at least 3 tetanus and diphtheria-containing shots sometime in your life or have a deep or dirty wound.  HPV. You need this vaccine if you are a woman age 26 or younger. The vaccine is given in 3 doses over 6 months.  Measles, mumps, rubella (MMR). You need at least 1 dose of MMR if you were born in 1957 or later. You may also need a second dose.  Meningococcal. If you are age 19 to 21 and a first-year college student living in a residence hall, or have one of several medical conditions, you need to get vaccinated against meningococcal disease. You may also need additional booster doses.  Zoster (shingles). If you are age 60 or older, you should get this vaccine.  Varicella (chickenpox). If you have never had chickenpox or you were vaccinated but received only 1 dose, talk to your caregiver to find out if you need this vaccine.  Hepatitis A. You need this vaccine if you have a specific risk factor for hepatitis A virus infection or you simply wish to be protected from this disease. The vaccine is usually given as 2 doses, 6 to 18 months apart.  Hepatitis B. You need this vaccine if you have a specific risk factor for hepatitis B virus infection or you simply wish to be protected from this disease. The vaccine is given in 3 doses, usually over 6 months. Preventive Services / Frequency Ages 19 to 39  Blood  pressure check.** / Every 1 to 2 years.  Lipid and cholesterol check.** / Every 5 years beginning at age 20.  Clinical breast exam.** / Every 3 years for women in their 20s and 30s.  Pap test.** / Every 2 years from ages 21 through 29. Every 3 years starting at age 30 through age 65 or 70 with a history of 3 consecutive normal Pap tests.  HPV screening.** / Every 3 years from ages 30 through ages 65 to 70 with a history of 3 consecutive normal Pap tests.  Hepatitis C blood test.** / For any individual with known risks for hepatitis C.  Skin self-exam. / Monthly.  Influenza immunization.** / Every year.  Pneumococcal polysaccharide immunization.** / 1 to 2 doses if you smoke cigarettes or if you have certain chronic medical conditions.  Tetanus, diphtheria, pertussis (Tdap, Td) immunization. / A one-time dose of Tdap vaccine. After that, you need a Td booster dose every 10 years.  HPV immunization. / 3 doses over 6 months, if you are 26 and younger.  Measles, mumps, rubella (MMR) immunization. / You need at least 1 dose of MMR if you were born in 1957 or later. You may also need a second dose.  Meningococcal immunization. / 1 dose if you are age 19 to 21 and a first-year college student living in a residence hall, or have one of several medical conditions, you need to get vaccinated against meningococcal disease. You may also need additional booster doses.  Varicella immunization.** / Consult your caregiver.  Hepatitis A immunization.** / Consult your caregiver. 2 doses, 6 to 18 months apart.  Hepatitis B immunization.** / Consult your caregiver. 3 doses usually over 6 months. Ages 40 to 64  Blood pressure check.** / Every 1 to 2 years.  Lipid and cholesterol check.** / Every 5 years beginning at age 20.  Clinical breast exam.** / Every year after age 40.  Mammogram.** / Every year beginning at age 40   and continuing for as long as you are in good health. Consult with your  caregiver.  Pap test.** / Every 3 years starting at age 30 through age 65 or 70 with a history of 3 consecutive normal Pap tests.  HPV screening.** / Every 3 years from ages 30 through ages 65 to 70 with a history of 3 consecutive normal Pap tests.  Fecal occult blood test (FOBT) of stool. / Every year beginning at age 50 and continuing until age 75. You may not need to do this test if you get a colonoscopy every 10 years.  Flexible sigmoidoscopy or colonoscopy.** / Every 5 years for a flexible sigmoidoscopy or every 10 years for a colonoscopy beginning at age 50 and continuing until age 75.  Hepatitis C blood test.** / For all people born from 1945 through 1965 and any individual with known risks for hepatitis C.  Skin self-exam. / Monthly.  Influenza immunization.** / Every year.  Pneumococcal polysaccharide immunization.** / 1 to 2 doses if you smoke cigarettes or if you have certain chronic medical conditions.  Tetanus, diphtheria, pertussis (Tdap, Td) immunization.** / A one-time dose of Tdap vaccine. After that, you need a Td booster dose every 10 years.  Measles, mumps, rubella (MMR) immunization. / You need at least 1 dose of MMR if you were born in 1957 or later. You may also need a second dose.  Varicella immunization.** / Consult your caregiver.  Meningococcal immunization.** / Consult your caregiver.  Hepatitis A immunization.** / Consult your caregiver. 2 doses, 6 to 18 months apart.  Hepatitis B immunization.** / Consult your caregiver. 3 doses, usually over 6 months. Ages 65 and over  Blood pressure check.** / Every 1 to 2 years.  Lipid and cholesterol check.** / Every 5 years beginning at age 20.  Clinical breast exam.** / Every year after age 40.  Mammogram.** / Every year beginning at age 40 and continuing for as long as you are in good health. Consult with your caregiver.  Pap test.** / Every 3 years starting at age 30 through age 65 or 70 with a 3  consecutive normal Pap tests. Testing can be stopped between 65 and 70 with 3 consecutive normal Pap tests and no abnormal Pap or HPV tests in the past 10 years.  HPV screening.** / Every 3 years from ages 30 through ages 65 or 70 with a history of 3 consecutive normal Pap tests. Testing can be stopped between 65 and 70 with 3 consecutive normal Pap tests and no abnormal Pap or HPV tests in the past 10 years.  Fecal occult blood test (FOBT) of stool. / Every year beginning at age 50 and continuing until age 75. You may not need to do this test if you get a colonoscopy every 10 years.  Flexible sigmoidoscopy or colonoscopy.** / Every 5 years for a flexible sigmoidoscopy or every 10 years for a colonoscopy beginning at age 50 and continuing until age 75.  Hepatitis C blood test.** / For all people born from 1945 through 1965 and any individual with known risks for hepatitis C.  Osteoporosis screening.** / A one-time screening for women ages 65 and over and women at risk for fractures or osteoporosis.  Skin self-exam. / Monthly.  Influenza immunization.** / Every year.  Pneumococcal polysaccharide immunization.** / 1 dose at age 65 (or older) if you have never been vaccinated.  Tetanus, diphtheria, pertussis (Tdap, Td) immunization. / A one-time dose of Tdap vaccine if you are over   65 and have contact with an infant, are a healthcare worker, or simply want to be protected from whooping cough. After that, you need a Td booster dose every 10 years.  Varicella immunization.** / Consult your caregiver.  Meningococcal immunization.** / Consult your caregiver.  Hepatitis A immunization.** / Consult your caregiver. 2 doses, 6 to 18 months apart.  Hepatitis B immunization.** / Check with your caregiver. 3 doses, usually over 6 months. ** Family history and personal history of risk and conditions may change your caregiver's recommendations. Document Released: 11/19/2001 Document Revised: 12/16/2011  Document Reviewed: 02/18/2011 [employer] Patient Information 2013 [employer].  

## 2012-08-24 NOTE — Assessment & Plan Note (Signed)
con't meds 

## 2012-08-24 NOTE — Assessment & Plan Note (Signed)
Check labs Pt having extreme fatigue with statin-- hold for 2 weeks

## 2012-08-25 LAB — POCT URINALYSIS DIPSTICK
Bilirubin, UA: NEGATIVE
Blood, UA: NEGATIVE
Glucose, UA: NEGATIVE
Nitrite, UA: NEGATIVE
Spec Grav, UA: 1.01
Urobilinogen, UA: 0.2

## 2012-08-25 LAB — MICROALBUMIN / CREATININE URINE RATIO: Microalb Creat Ratio: 1 mg/g (ref 0.0–30.0)

## 2012-08-25 NOTE — Addendum Note (Signed)
Addended by: Silvio Pate D on: 08/25/2012 02:14 PM   Modules accepted: Orders

## 2012-08-27 LAB — URINE CULTURE: Colony Count: >=100000

## 2012-08-28 ENCOUNTER — Telehealth: Payer: Self-pay

## 2012-08-28 MED ORDER — CIPROFLOXACIN HCL 500 MG PO TABS: 500.0000 mg | ORAL_TABLET | Freq: Two times a day (BID) | ORAL | Status: DC

## 2012-08-28 NOTE — Telephone Encounter (Signed)
Discussed with patient and she voiced understanding.      KP 

## 2012-08-28 NOTE — Telephone Encounter (Signed)
Message copied by Arnette Norris on Fri Aug 28, 2012  5:19 PM ------      Message from: Lelon Perla      Created: Thu Aug 27, 2012 11:05 PM       +UTI--- cipro 500 mg 1 po bid for 5 days

## 2012-09-07 ENCOUNTER — Encounter: Payer: Self-pay | Admitting: Family Medicine

## 2012-09-08 ENCOUNTER — Other Ambulatory Visit: Payer: Self-pay | Admitting: Family Medicine

## 2012-09-08 NOTE — Telephone Encounter (Signed)
Rx sent.    MW 

## 2012-09-21 ENCOUNTER — Encounter: Payer: Self-pay | Admitting: Family Medicine

## 2012-09-21 ENCOUNTER — Ambulatory Visit (INDEPENDENT_AMBULATORY_CARE_PROVIDER_SITE_OTHER): Payer: Medicare Other | Admitting: Family Medicine

## 2012-09-21 VITALS — BP 122/74 | HR 87 | Temp 98.2°F | Wt 137.4 lb

## 2012-09-21 DIAGNOSIS — I10 Essential (primary) hypertension: Secondary | ICD-10-CM

## 2012-09-21 DIAGNOSIS — IMO0001 Reserved for inherently not codable concepts without codable children: Secondary | ICD-10-CM

## 2012-09-21 DIAGNOSIS — I639 Cerebral infarction, unspecified: Secondary | ICD-10-CM

## 2012-09-21 DIAGNOSIS — R5381 Other malaise: Secondary | ICD-10-CM

## 2012-09-21 DIAGNOSIS — R42 Dizziness and giddiness: Secondary | ICD-10-CM

## 2012-09-21 DIAGNOSIS — R413 Other amnesia: Secondary | ICD-10-CM

## 2012-09-21 DIAGNOSIS — E785 Hyperlipidemia, unspecified: Secondary | ICD-10-CM

## 2012-09-21 DIAGNOSIS — R5383 Other fatigue: Secondary | ICD-10-CM

## 2012-09-21 DIAGNOSIS — I6350 Cerebral infarction due to unspecified occlusion or stenosis of unspecified cerebral artery: Secondary | ICD-10-CM

## 2012-09-21 MED ORDER — PRAVASTATIN SODIUM 20 MG PO TABS: 20.0000 mg | ORAL_TABLET | Freq: Every day | ORAL | Status: DC

## 2012-09-21 MED ORDER — METOPROLOL SUCCINATE ER 25 MG PO TB24: 25.0000 mg | ORAL_TABLET | Freq: Every day | ORAL | Status: DC

## 2012-09-21 NOTE — Assessment & Plan Note (Signed)
Cut pravastatin in 1/2  Recheck labs 3 months

## 2012-09-21 NOTE — Progress Notes (Signed)
  Subjective:    Patient ID: Gabriela Patel, female    DOB: 01/26/1927, 78 y.o.   MRN: [id number]  HPI Pt here to discuss meds.  She feels so tired and feels it may be statin or metoprolol.  We stopped pravastatin for a few weeks and she said originally she did not notice a difference but when she restarted it she said she felt slightly worse.  She notice feeling bad when metoprolol bid was started as well.   Review of Systems As above    Objective:   Physical Exam  Constitutional: She is oriented to person, place, and time. She appears well-developed and well-nourished.  Neurological: She is alert and oriented to person, place, and time.  Psychiatric: She has a normal mood and affect. Her behavior is normal. Judgment and thought content normal.          Assessment & Plan:

## 2012-09-21 NOTE — Patient Instructions (Addendum)

## 2012-09-21 NOTE — Assessment & Plan Note (Signed)
Check labs Change metoprolol to toprol xl Consider changing med if she still feels tired b complex

## 2012-10-06 ENCOUNTER — Telehealth: Payer: Self-pay | Admitting: Family Medicine

## 2012-10-06 NOTE — Telephone Encounter (Signed)
pt.s daughter called stated she needed to see dr.lowne to go over medical issues, advised [name] Apple she would need to come in with her mother. [name] stated that is part of the problem as her mother will not give them any medical information. [name] noted she does have POA, advised I would check with mgmt and clinical staff & call back once I had a better understanding of what we could do. Per KP/SJ--pt must still be present, POA does not eliminate patient HIPPA -- pt must still be present and it still will not allow access to patients medical information  Called [name] Apple back advised per above, she stated she would speak with her mom & call me back

## 2012-10-19 ENCOUNTER — Ambulatory Visit (INDEPENDENT_AMBULATORY_CARE_PROVIDER_SITE_OTHER): Payer: Medicare Other | Admitting: Family Medicine

## 2012-10-19 ENCOUNTER — Encounter: Payer: Self-pay | Admitting: Family Medicine

## 2012-10-19 VITALS — BP 144/76 | HR 86 | Temp 98.8°F | Wt 138.2 lb

## 2012-10-19 DIAGNOSIS — I10 Essential (primary) hypertension: Secondary | ICD-10-CM

## 2012-10-19 MED ORDER — LOSARTAN POTASSIUM 50 MG PO TABS: ORAL_TABLET | ORAL | Status: DC

## 2012-10-19 NOTE — Patient Instructions (Addendum)
Stroke (Cerebrovascular Accident) A stroke (cerebrovascular accident, CVA) means you have a brain injury from blocked circulation or bleeding in the brain. Blocked circulation usually comes from a clot. RISK FACTORS  High blood pressure (hypertension).   High cholesterol.   Diabetes.   Heart disease.   The buildup of fatty deposits in the blood vessels (peripheral artery disease or atherosclerosis).   An abnormal heart rhythm (atrial fibrillation).   Obesity.   Smoking.   Taking oral contraceptives (especially in combination with smoking).   Physical inactivity.   A diet high in fats, salt (sodium), and calories.   Alcohol use.   Use of illegal drugs (especially cocaine and methamphetamine).   Being a female.   Being an Tree surgeon.   Age over 62.   Family history of stroke.   Previous history of blood clots, a "warning stroke" (transient ischemic attack, TIA), or heart attack.   Sickle cell disease.  SYMPTOMS  The symptoms of a stroke depend on the part of the brain that is affected. It is important to seek treatment within 4 hours of the start of symptoms because you may receive a "clot dissolving" medication that cannot be given after that time. Even if you don't know when your symptoms began, get treatment as soon as possible. Symptoms of a stroke may progress or change over the first several days. Symptoms may include:  Sudden weakness or numbness of the face, arm, or leg, especially on one side of the body.   Sudden confusion.   Trouble speaking (aphasia) or understanding.   Sudden trouble seeing in one or both eyes.   Sudden trouble walking.   Dizziness.   Loss of balance or coordination.   Sudden severe headache with no known cause.  HOME CARE INSTRUCTIONS   Medicines: Aspirin and blood thinners may be used to prevent another stroke. Blood thinners need to be used exactly as instructed. Medicines may also be used to control risk factors for a  stroke. Be sure you understand all your medicine instructions.   Diet: Certain diets may be prescribed to address high blood pressure, high cholesterol, diabetes, or obesity. A diet that includes 5 or more servings of fruits and vegetables a day may reduce the risk of stroke. Foods may need to be a special consistency (soft or pureed), or small bites may need to be taken in order to avoid aspirating or choking.   Maintain a healthy weight.   Stay physically active. It is recommended that you get at least 30 minutes of activity on most or all days.   Do not smoke.   Limit alcohol use.   Stop drug abuse.   Home safety: A safe home environment is important to reduce the risk of falls. Your caregiver may arrange for specialists to evaluate your home. Having grab bars in the bedroom and bathroom is often important. Your caregiver may arrange for special equipment to be used at home, such as raised toilets and a seat for the shower.   Physical, occupational, and speech therapy: Ongoing therapy may be needed to maximize your recovery after a stroke. If you have been advised to use a walker or a cane, use it at all times. Be sure to keep your therapy appointments.   Follow all instructions for follow-up with your caregiver. This is VERY important. This includes any referrals, physical therapy, rehabilitation, and laboratory tests. Proper treatment also prevents another stroke from occurring.  SEEK IMMEDIATE MEDICAL CARE IF:   You have  sudden weakness or numbness of the face, arm, or leg, especially on one side of the body.   You have sudden confusion.   You have trouble speaking (aphasia) or understanding.   You have sudden trouble seeing in one or both eyes.   You have sudden trouble walking.   You have dizziness.   You have loss of balance or coordination.   You have a sudden, severe headache with no known cause.   You have a fever.   You are coughing or have difficulty breathing.     You have new chest pain, angina, or an irregular heartbeat.  Any of these symptoms may represent a serious problem that is an emergency. Do not wait to see if the symptoms will go away. Get medical help at once. Call your local emergency services (911 in [place] Do not drive yourself to the hospital. Document Released: 09/23/2005 Document Revised: 04/08/2011 Document Reviewed: 02/21/2010 ExitCare Patient Information 2012 ExitCare, [facility]  Hypertension As your heart beats, it forces blood through your arteries. This force is your blood pressure. If the pressure is too high, it is called hypertension (HTN) or high blood pressure. HTN is dangerous because you may have it and not know it. High blood pressure may mean that your heart has to work harder to pump blood. Your arteries may be narrow or stiff. The extra work puts you at risk for heart disease, stroke, and other problems.  Blood pressure consists of two numbers, a higher number over a lower, 110/72, for example. It is stated as "110 over 72." The ideal is below 120 for the top number (systolic) and under 80 for the bottom (diastolic). Write down your blood pressure today. You should pay close attention to your blood pressure if you have certain conditions such as:  Heart failure.  Prior heart attack.  Diabetes  Chronic kidney disease.  Prior stroke.  Multiple risk factors for heart disease. To see if you have HTN, your blood pressure should be measured while you are seated with your arm held at the level of the heart. It should be measured at least twice. A one-time elevated blood pressure reading (especially in the Emergency Department) does not mean that you need treatment. There may be conditions in which the blood pressure is different between your right and left arms. It is important to see your caregiver soon for a recheck. Most people have essential hypertension which means that there is not a specific cause. This type of high  blood pressure may be lowered by changing lifestyle factors such as:  Stress.  Smoking.  Lack of exercise.  Excessive weight.  Drug/tobacco/alcohol use.  Eating less salt. Most people do not have symptoms from high blood pressure until it has caused damage to the body. Effective treatment can often prevent, delay or reduce that damage. TREATMENT  When a cause has been identified, treatment for high blood pressure is directed at the cause. There are a large number of medications to treat HTN. These fall into several categories, and your caregiver will help you select the medicines that are best for you. Medications may have side effects. You should review side effects with your caregiver. If your blood pressure stays high after you have made lifestyle changes or started on medicines,   Your medication(s) may need to be changed.  Other problems may need to be addressed.  Be certain you understand your prescriptions, and know how and when to take your medicine.  Be sure to  follow up with your caregiver within the time frame advised (usually within two weeks) to have your blood pressure rechecked and to review your medications.  If you are taking more than one medicine to lower your blood pressure, make sure you know how and at what times they should be taken. Taking two medicines at the same time can result in blood pressure that is too low. SEEK IMMEDIATE MEDICAL CARE IF:  You develop a severe headache, blurred or changing vision, or confusion.  You have unusual weakness or numbness, or a faint feeling.  You have severe chest or abdominal pain, vomiting, or breathing problems. MAKE SURE YOU:   Understand these instructions.  Will watch your condition.  Will get help right away if you are not doing well or get worse. Document Released: 09/23/2005 Document Revised: 12/16/2011 Document Reviewed: 05/13/2008 [facility] Patient Information 2013 [place], [place].

## 2012-10-19 NOTE — Progress Notes (Signed)
  Subjective:    Patient ID: Gabriela Patel, female    DOB: 1927-09-25, 79 y.o.   MRN: [id number]  HPI  Pt here c/o con't fatigue.  She is better since changing meds but not completely.   No cp, sob.  Review of Systems As above    Objective:   Physical Exam  BP 144/76  Pulse 86  Temp 98.8 F (37.1 C) (Oral)  Wt 138 lb 3.2 oz (62.687 kg)  SpO2 97% General appearance: alert, cooperative, appears stated age and no distress Neck: no adenopathy, no carotid bruit, no JVD, supple, symmetrical, trachea midline and thyroid not enlarged, symmetric, no tenderness/mass/nodules Lungs: clear to auscultation bilaterally Heart: S1, S2 normal Extremities: extremities normal, atraumatic, no cyanosis or edema      Assessment & Plan:  Fatigue---? If it is from bp med---chang BB to losartan                F/u in 2 weeks if symptoms no better consider depression.  Pt admits it may be part of the problem and is willing to try med if this does not work.   Her sister is on celexa.

## 2012-11-05 ENCOUNTER — Encounter: Payer: Self-pay | Admitting: Family Medicine

## 2012-11-05 ENCOUNTER — Ambulatory Visit (INDEPENDENT_AMBULATORY_CARE_PROVIDER_SITE_OTHER): Payer: Medicare Other | Admitting: Family Medicine

## 2012-11-05 VITALS — BP 132/60 | HR 60 | Temp 99.1°F | Wt 136.8 lb

## 2012-11-05 DIAGNOSIS — R269 Unspecified abnormalities of gait and mobility: Secondary | ICD-10-CM

## 2012-11-05 DIAGNOSIS — R42 Dizziness and giddiness: Secondary | ICD-10-CM

## 2012-11-05 DIAGNOSIS — I10 Essential (primary) hypertension: Secondary | ICD-10-CM

## 2012-11-05 DIAGNOSIS — R5383 Other fatigue: Secondary | ICD-10-CM

## 2012-11-05 DIAGNOSIS — R52 Pain, unspecified: Secondary | ICD-10-CM

## 2012-11-05 DIAGNOSIS — R350 Frequency of micturition: Secondary | ICD-10-CM

## 2012-11-05 LAB — POCT URINALYSIS DIPSTICK
Bilirubin, UA: NEGATIVE
Ketones, UA: NEGATIVE

## 2012-11-05 NOTE — Addendum Note (Signed)
Addended by: Arnette Norris on: 11/05/2012 02:46 PM   Modules accepted: Orders

## 2012-11-05 NOTE — Patient Instructions (Signed)

## 2012-11-05 NOTE — Progress Notes (Signed)
  Subjective:    Patient here for follow-up of elevated blood pressure.  She is not exercising and is adherent to a low-salt diet.  Blood pressure is well controlled at home. Cardiac symptoms: fatigue. Patient denies: chest pain, chest pressure/discomfort, claudication, dyspnea, exertional chest pressure/discomfort, irregular heart beat, lower extremity edema, near-syncope, orthopnea, palpitations, paroxysmal nocturnal dyspnea, syncope and tachypnea. Cardiovascular risk factors: advanced age (older than 39 for men, 22 for women), dyslipidemia and hypertension. Use of agents associated with hypertension: none. History of target organ damage: stroke.  The following portions of the patient's history were reviewed and updated as appropriate: allergies, current medications, past family history, past medical history, past social history, past surgical history and problem list.  Review of Systems Pertinent items are noted in HPI.     Objective:    BP 132/60  Pulse 60  Temp 99.1 F (37.3 C) (Oral)  Wt 136 lb 12.8 oz (62.052 kg)  SpO2 96% General appearance: alert, cooperative, appears stated age and no distress Lungs: clear to auscultation bilaterally Heart: S1, S2 normal Extremities: extremities normal, atraumatic, no cyanosis or edema    Assessment:    Hypertension, normal blood pressure . Evidence of target organ damage: stroke.   fatigue with dizziness-- b12 never done-- check today Plan:    Medication: decrease to losartan 25 mg . Dietary sodium restriction. Regular aerobic exercise. Check blood pressures 1 times daily and record. Follow up: 2 weeks and as needed. check b12  With loose stools and low grade fever I'm concerned she may be getting sick.  UA C &S done, call with any new symptoms

## 2012-11-19 ENCOUNTER — Ambulatory Visit: Payer: Medicare Other | Admitting: Family Medicine

## 2012-11-26 ENCOUNTER — Encounter: Payer: Self-pay | Admitting: Family Medicine

## 2012-11-26 ENCOUNTER — Ambulatory Visit (INDEPENDENT_AMBULATORY_CARE_PROVIDER_SITE_OTHER): Payer: Medicare Other | Admitting: Family Medicine

## 2012-11-26 VITALS — BP 142/68 | HR 69 | Temp 98.3°F | Wt 137.2 lb

## 2012-11-26 DIAGNOSIS — R195 Other fecal abnormalities: Secondary | ICD-10-CM

## 2012-11-26 DIAGNOSIS — K921 Melena: Secondary | ICD-10-CM

## 2012-11-26 DIAGNOSIS — I10 Essential (primary) hypertension: Secondary | ICD-10-CM

## 2012-11-26 LAB — CBC WITH DIFFERENTIAL/PLATELET
Eosinophils Relative: 1.1 % (ref 0.0–5.0)
HCT: 39.3 % (ref 36.0–46.0)
Hemoglobin: 13 g/dL (ref 12.0–15.0)
Lymphs Abs: 1.7 10*3/uL (ref 0.7–4.0)
Monocytes Relative: 5.9 % (ref 3.0–12.0)
Neutro Abs: 6.7 10*3/uL (ref 1.4–7.7)
WBC: 9.1 10*3/uL (ref 4.5–10.5)

## 2012-11-26 MED ORDER — LOSARTAN POTASSIUM 50 MG PO TABS: ORAL_TABLET | ORAL | Status: DC

## 2012-11-26 NOTE — Patient Instructions (Addendum)

## 2012-11-27 ENCOUNTER — Encounter: Payer: Self-pay | Admitting: *Deleted

## 2012-11-27 NOTE — Progress Notes (Signed)
  Subjective:    Patient here for follow-up of elevated blood pressure.  She is not exercising and is adherent to a low-salt diet.  Blood pressure is well controlled at home. Cardiac symptoms: fatigue. Patient denies: chest pain, chest pressure/discomfort, claudication, dyspnea, exertional chest pressure/discomfort, irregular heart beat, lower extremity edema, near-syncope, orthopnea, palpitations, paroxysmal nocturnal dyspnea, syncope and tachypnea. Cardiovascular risk factors: advanced age (older than 29 for men, 49 for women), hypertension and sedentary lifestyle. Use of agents associated with hypertension: none. History of target organ damage: stroke.  Pt also still c/o extreme fatigue and now black stools.  No other complaints  The following portions of the patient's history were reviewed and updated as appropriate: allergies, current medications, past family history, past medical history, past social history, past surgical history and problem list.  Review of Systems Pertinent items are noted in HPI.     Objective:    BP 142/68  Pulse 69  Temp(Src) 98.3 F (36.8 C) (Oral)  Wt 137 lb 3.2 oz (62.234 kg)  BMI 26.8 kg/m2  SpO2 99% General appearance: alert, cooperative, appears stated age and no distress Lungs: clear to auscultation bilaterally Heart: S1, S2 normal Extremities: extremities normal, atraumatic, no cyanosis or edema    Assessment:    Hypertension, normal blood pressure . Evidence of target organ damage: stroke.   Black stools--- check cbc, ifob Plan:    Medication: no change. Dietary sodium restriction. Regular aerobic exercise. Follow up: 3 months and as needed.

## 2012-12-01 ENCOUNTER — Ambulatory Visit: Payer: Medicare Other

## 2013-01-20 ENCOUNTER — Ambulatory Visit (INDEPENDENT_AMBULATORY_CARE_PROVIDER_SITE_OTHER): Payer: Medicare Other | Admitting: Nurse Practitioner

## 2013-01-20 ENCOUNTER — Encounter: Payer: Self-pay | Admitting: Nurse Practitioner

## 2013-01-20 VITALS — BP 145/70 | HR 86 | Ht 60.0 in | Wt 139.0 lb

## 2013-01-20 DIAGNOSIS — Z8679 Personal history of other diseases of the circulatory system: Secondary | ICD-10-CM

## 2013-01-20 DIAGNOSIS — I10 Essential (primary) hypertension: Secondary | ICD-10-CM

## 2013-01-20 DIAGNOSIS — E785 Hyperlipidemia, unspecified: Secondary | ICD-10-CM

## 2013-01-20 DIAGNOSIS — R531 Weakness: Secondary | ICD-10-CM

## 2013-01-20 DIAGNOSIS — R5381 Other malaise: Secondary | ICD-10-CM

## 2013-01-20 DIAGNOSIS — I6350 Cerebral infarction due to unspecified occlusion or stenosis of unspecified cerebral artery: Secondary | ICD-10-CM

## 2013-01-20 NOTE — Patient Instructions (Addendum)
Continue aspirin for secondary stroke prevention Strict control of blood pressure with systolic blood pressure: 130 LDL goal below 100 which is managed by Dr. Laury Axon. Please use cane at all times when out and about Will check CPK today Followup in 6 months

## 2013-01-20 NOTE — Progress Notes (Signed)
HPI: Patient returns for followup after last visit 09/17/2012. She denies  double vision, loss of vision, focal weakness, numbness, speech or swallowing problems, confusions, blackouts. She does however complain of left leg heaviness, and she does not feel motivated to do anything. She has no objective signs of weakness or gait instability on exam. She calls this fatigue. TSH was checked at last visit and was within normal limits. She continues to drive and be independent in her activities of daily living. She continues to live alone. She denies depression, she gets no regular exercise   ROS:  Negative  Physical Exam General: well developed, well nourished, seated, in no evident distress Head: head normocephalic and atraumatic. Oropharynx benign Neck: supple with no carotid or supraclavicular bruits Cardiovascular: regular rate and rhythm, no murmurs  Neurologic Exam Mental Status: Awake and fully alert. Oriented to place and time. Recent and remote memory intact. Attention span, concentration and fund of knowledge appropriate. Mood and affect appropriate.GDS=1  Cranial Nerves:  Pupils equal, briskly reactive to light. Extraocular movements full without nystagmus. Visual fields full to confrontation. Hearing intact and symmetric to finger snap. Facial sensation intact. Face, tongue, palate move normally and symmetrically. Neck flexion and extension normal.  Motor: Normal bulk and tone. Normal strength in all tested extremity muscles. No focal weakness Sensory.: intact to touch and pinprick and vibratory.  Coordination: Rapid alternating movements normal in all extremities. Finger-to-nose and heel-to-shin performed accurately bilaterally. Gait and Station: Arises from chair without difficulty. Stance is normal.  Able to heel, toe and tandem walk without difficulty. Using cane Reflexes: 2+ and symmetric. Toes downgoing.     ASSESSMENT: Right internal capsule lacunar infarct in January 2011 from  small vessel disease with risk factors of hypertension and mild hyperlipidemia. She has had persistent left leg heaviness but no objective signs of weakness or gait instability on exam. Long history of statin use     PLAN:  Continue aspirin for secondary stroke prevention Strict control of blood pressure with systolic blood pressure: 130 LDL goal below 100 which is managed by Dr. Laury Axon. Please use cane at all times when out and about Will check CK today  Followup in 6 months  Nilda Riggs, GNP-BC APRN

## 2013-01-21 ENCOUNTER — Other Ambulatory Visit: Payer: Self-pay | Admitting: Nurse Practitioner

## 2013-01-21 MED ORDER — ESCITALOPRAM OXALATE 10 MG PO TABS: 10.0000 mg | ORAL_TABLET | Freq: Every day | ORAL | Status: DC

## 2013-01-29 ENCOUNTER — Telehealth: Payer: Self-pay | Admitting: *Deleted

## 2013-01-29 NOTE — Telephone Encounter (Signed)
TC to Ms Logan Bores. She really can't describe her sensation she just does not feel good. Since Lexapro is the only new medication she was asked to stop the drug and if she is not better by the first of the week she needs to followup with her primary care. She was just seen here the first of the month. She is agreeable to that

## 2013-01-29 NOTE — Telephone Encounter (Signed)
Patient called stated she is feeling Really bad patient was recently placed on lexapro, she does not feel like this is the cause but have some concerns.

## 2013-02-08 ENCOUNTER — Telehealth: Payer: Self-pay | Admitting: *Deleted

## 2013-02-08 DIAGNOSIS — I10 Essential (primary) hypertension: Secondary | ICD-10-CM

## 2013-02-08 MED ORDER — LOSARTAN POTASSIUM 50 MG PO TABS: ORAL_TABLET | ORAL | Status: DC

## 2013-02-08 NOTE — Telephone Encounter (Signed)
Keep 1/2

## 2013-02-08 NOTE — Telephone Encounter (Signed)
Please clarify sig faxed states losartan 50 mg 1 tab daily but our records indicated 1/2 tab daily.Please advise

## 2013-02-22 ENCOUNTER — Encounter: Payer: Self-pay | Admitting: Family Medicine

## 2013-02-22 ENCOUNTER — Ambulatory Visit (INDEPENDENT_AMBULATORY_CARE_PROVIDER_SITE_OTHER): Payer: Medicare Other | Admitting: Family Medicine

## 2013-02-22 ENCOUNTER — Ambulatory Visit: Payer: Medicare Other | Admitting: Family Medicine

## 2013-02-22 VITALS — BP 150/70 | HR 80 | Temp 98.1°F | Wt 134.0 lb

## 2013-02-22 DIAGNOSIS — F4323 Adjustment disorder with mixed anxiety and depressed mood: Secondary | ICD-10-CM

## 2013-02-22 DIAGNOSIS — I10 Essential (primary) hypertension: Secondary | ICD-10-CM

## 2013-02-22 DIAGNOSIS — I6350 Cerebral infarction due to unspecified occlusion or stenosis of unspecified cerebral artery: Secondary | ICD-10-CM

## 2013-02-22 DIAGNOSIS — F411 Generalized anxiety disorder: Secondary | ICD-10-CM | POA: Insufficient documentation

## 2013-02-22 DIAGNOSIS — I639 Cerebral infarction, unspecified: Secondary | ICD-10-CM

## 2013-02-22 DIAGNOSIS — N390 Urinary tract infection, site not specified: Secondary | ICD-10-CM

## 2013-02-22 DIAGNOSIS — M1990 Unspecified osteoarthritis, unspecified site: Secondary | ICD-10-CM

## 2013-02-22 DIAGNOSIS — R5381 Other malaise: Secondary | ICD-10-CM

## 2013-02-22 DIAGNOSIS — R5383 Other fatigue: Secondary | ICD-10-CM

## 2013-02-22 DIAGNOSIS — E785 Hyperlipidemia, unspecified: Secondary | ICD-10-CM

## 2013-02-22 LAB — HEPATIC FUNCTION PANEL
AST: 27 U/L (ref 0–37)
Alkaline Phosphatase: 53 U/L (ref 39–117)
Total Bilirubin: 0.3 mg/dL (ref 0.3–1.2)

## 2013-02-22 LAB — LIPID PANEL
Cholesterol: 180 mg/dL (ref 0–200)
LDL Cholesterol: 74 mg/dL (ref 0–99)
VLDL: 11.4 mg/dL (ref 0.0–40.0)

## 2013-02-22 LAB — CBC WITH DIFFERENTIAL/PLATELET
Basophils Absolute: 0.1 10*3/uL (ref 0.0–0.1)
Lymphocytes Relative: 14.2 % (ref 12.0–46.0)
Monocytes Relative: 4.8 % (ref 3.0–12.0)
Platelets: 248 10*3/uL (ref 150.0–400.0)
RDW: 14.5 % (ref 11.5–14.6)

## 2013-02-22 LAB — BASIC METABOLIC PANEL
BUN: 10 mg/dL (ref 6–23)
Calcium: 9.4 mg/dL (ref 8.4–10.5)
GFR: 75.89 mL/min (ref >60.00)
Glucose, Bld: 90 mg/dL (ref 70–99)

## 2013-02-22 MED ORDER — SERTRALINE HCL 25 MG PO TABS: 25.0000 mg | ORAL_TABLET | Freq: Every day | ORAL | Status: DC

## 2013-02-22 NOTE — Assessment & Plan Note (Signed)
Recheck labs Advised pt take mvi and b complex

## 2013-02-22 NOTE — Progress Notes (Signed)
  Subjective:    Patient here for follow-up of elevated blood pressure.  She is exercising ( walking target 1x a week) and is adherent to a low-salt diet.  Blood pressure is well controlled at home. Cardiac symptoms: fatigue. Patient denies: chest pain, chest pressure/discomfort, claudication, dyspnea, exertional chest pressure/discomfort, irregular heart beat, lower extremity edema, near-syncope, orthopnea, palpitations, paroxysmal nocturnal dyspnea, syncope and tachypnea. Cardiovascular risk factors: advanced age (older than 27 for men, 52 for women), dyslipidemia, hypertension and sedentary lifestyle. Use of agents associated with hypertension: none. History of target organ damage: stroke. Pt daughter is present and is concerned about her driving.   She also feels she is anxious.  Pt still drives to pharmacy , grocery store and church.    The following portions of the patient's history were reviewed and updated as appropriate: allergies, current medications, past family history, past medical history, past social history, past surgical history and problem list.  Review of Systems Pertinent items are noted in HPI.     Objective:    BP 150/70  Pulse 80  Temp(Src) 98.1 F (36.7 C) (Oral)  Wt 134 lb (60.782 kg)  BMI 26.17 kg/m2  SpO2 97% General appearance: alert, cooperative, appears stated age and no distress Lungs: clear to auscultation bilaterally Heart: S1, S2 normal Extremities: extremities normal, atraumatic, no cyanosis or edema    Assessment:    Hypertension, normal blood pressure . Evidence of target organ damage: stroke.    Plan:    Medication: no change. Dietary sodium restriction. Regular aerobic exercise. Check blood pressures 2-3 times weekly and record. Follow up: 1 month and as needed. -----for anxiety

## 2013-02-22 NOTE — Assessment & Plan Note (Signed)
Stable Cont meds 

## 2013-02-22 NOTE — Assessment & Plan Note (Signed)
Check labs con't meds 

## 2013-02-22 NOTE — Assessment & Plan Note (Signed)
zoloft 25 mg 1/2 tab qd x 8 days and then take 1 po qd rto 1 month

## 2013-02-22 NOTE — Assessment & Plan Note (Signed)
Also contributes to slower reaction time

## 2013-02-22 NOTE — Patient Instructions (Addendum)

## 2013-02-22 NOTE — Assessment & Plan Note (Signed)
con't with neuro Daughter is present and is concerned that her moms reflexes are slowing and is concerned about her driving--- referral put in for driving eval

## 2013-02-23 LAB — POCT URINALYSIS DIPSTICK
Bilirubin, UA: NEGATIVE
Blood, UA: NEGATIVE
Glucose, UA: NEGATIVE
Ketones, UA: NEGATIVE
Nitrite, UA: NEGATIVE
Protein, UA: NEGATIVE
Spec Grav, UA: 1.015
Urobilinogen, UA: 0.2
pH, UA: 6.5

## 2013-02-23 NOTE — Addendum Note (Signed)
Addended by: Silvio Pate D on: 02/23/2013 04:38 PM   Modules accepted: Orders

## 2013-02-25 LAB — URINE CULTURE: Colony Count: >=100000

## 2013-02-26 MED ORDER — CIPROFLOXACIN HCL 250 MG PO TABS: 250.0000 mg | ORAL_TABLET | Freq: Two times a day (BID) | ORAL | Status: DC

## 2013-03-02 ENCOUNTER — Telehealth: Payer: Self-pay | Admitting: General Practice

## 2013-03-02 NOTE — Telephone Encounter (Signed)
Pt called stating that she is still feeling very fatigued and is worried that she may still have a UTI even though she never presented with symptoms and still is not having any. Pt has a follow up appt on 6-18, would like to know what she can do in the mean time. Pt was advised to try to give the sertraline a chance to work. Please advise.

## 2013-03-03 NOTE — Telephone Encounter (Signed)
We can recheck urine to see if it has cleared

## 2013-03-05 ENCOUNTER — Telehealth: Payer: Self-pay | Admitting: Family Medicine

## 2013-03-05 NOTE — Telephone Encounter (Signed)
In reference to [employer] referral, patient has declined.  Per call from [place] with [employer], she contacted the patient, and the patient declined their services due to cost, and because insurance will not cover.

## 2013-03-07 NOTE — Telephone Encounter (Signed)
Is daugher in her chart to talk to --  She is the one as well as the rest of the family who really wanted it.   I agree it is necesssary--- daugher should know she declined.

## 2013-03-08 NOTE — Telephone Encounter (Signed)
Msg left for [employer].     KP

## 2013-03-11 NOTE — Telephone Encounter (Signed)
Pt daughter called back and was given the message. Is going to speak with her mother regarding this issue and will notify our office as to their decision.

## 2013-03-22 ENCOUNTER — Telehealth: Payer: Self-pay | Admitting: Family Medicine

## 2013-03-22 NOTE — Telephone Encounter (Signed)
Patient is calling to inquire about whether she needs to fast for her appoinment tomorrow. Told her I was unsure due to no labs being in system. Please advise patient if she will visit labs tomorrow after appointment.

## 2013-03-22 NOTE — Telephone Encounter (Signed)
Patient has been made aware that she does not need to fast for her apt on Wednesday. She said she was not feeling well and I offered her a sooner apt and she declined. She said she was feeling fatigue and her legs are dragging due to her stroke last year, she said she just has not felt better since the stroke. I made the patient aware that she can call in if she changes her mind and would like to be seen sooner. She agreed and voiced understanding.    KP//CMA

## 2013-03-24 ENCOUNTER — Ambulatory Visit (INDEPENDENT_AMBULATORY_CARE_PROVIDER_SITE_OTHER): Payer: Medicare Other | Admitting: Family Medicine

## 2013-03-24 ENCOUNTER — Encounter: Payer: Self-pay | Admitting: Family Medicine

## 2013-03-24 VITALS — BP 144/72 | HR 76 | Temp 99.0°F | Wt 135.8 lb

## 2013-03-24 DIAGNOSIS — F329 Major depressive disorder, single episode, unspecified: Secondary | ICD-10-CM

## 2013-03-24 DIAGNOSIS — F411 Generalized anxiety disorder: Secondary | ICD-10-CM

## 2013-03-24 DIAGNOSIS — I10 Essential (primary) hypertension: Secondary | ICD-10-CM

## 2013-03-24 MED ORDER — SERTRALINE HCL 50 MG PO TABS: 50.0000 mg | ORAL_TABLET | Freq: Every day | ORAL | Status: DC

## 2013-03-24 NOTE — Patient Instructions (Addendum)

## 2013-03-24 NOTE — Assessment & Plan Note (Signed)
zoloft 50 mg 1 po qd rto 1 month or sooner prn

## 2013-03-24 NOTE — Progress Notes (Signed)
  Subjective:    Patient ID: Gabriela Patel, female    DOB: [birth year], 79 y.o.   MRN: [id number]  HPI . Pt here to f/u depression.  She does not feel like the med is doing anything.  Symptoms are not wose just no better.   Review of Systems    as above Objective:   Physical Exam BP 144/72  Pulse 76  Temp(Src) 99 F (37.2 C) (Oral)  Wt 135 lb 12.8 oz (61.598 kg)  BMI 26.52 kg/m2  SpO2 97% General appearance: alert, cooperative, appears stated age and no distress Neck: no adenopathy, supple, symmetrical, trachea midline and thyroid not enlarged, symmetric, no tenderness/mass/nodules Lungs: clear to auscultation bilaterally Heart: S1, S2 normal Extremities: extremities normal, atraumatic, no cyanosis or edema        Assessment & Plan:

## 2013-03-24 NOTE — Assessment & Plan Note (Signed)
Controlled con't meds  

## 2013-04-21 ENCOUNTER — Ambulatory Visit (INDEPENDENT_AMBULATORY_CARE_PROVIDER_SITE_OTHER): Payer: Medicare Other | Admitting: Family Medicine

## 2013-04-21 ENCOUNTER — Encounter: Payer: Self-pay | Admitting: Family Medicine

## 2013-04-21 VITALS — BP 136/79 | HR 75 | Temp 98.9°F | Wt 133.8 lb

## 2013-04-21 DIAGNOSIS — I10 Essential (primary) hypertension: Secondary | ICD-10-CM

## 2013-04-21 DIAGNOSIS — F329 Major depressive disorder, single episode, unspecified: Secondary | ICD-10-CM

## 2013-04-21 DIAGNOSIS — N390 Urinary tract infection, site not specified: Secondary | ICD-10-CM

## 2013-04-21 MED ORDER — SERTRALINE HCL 100 MG PO TABS: 100.0000 mg | ORAL_TABLET | Freq: Every day | ORAL | Status: DC

## 2013-04-21 MED ORDER — LOSARTAN POTASSIUM 25 MG PO TABS: ORAL_TABLET | ORAL | Status: DC

## 2013-04-21 NOTE — Patient Instructions (Signed)

## 2013-04-21 NOTE — Progress Notes (Signed)
  Subjective:    Patient here for follow-up of elevated blood pressure.  She is not exercising and is adherent to a low-salt diet.  Blood pressure is well controlled at home. Cardiac symptoms: fatigue. Patient denies: chest pain, chest pressure/discomfort, claudication, dyspnea, exertional chest pressure/discomfort, irregular heart beat, lower extremity edema, near-syncope, orthopnea, palpitations, paroxysmal nocturnal dyspnea, syncope and tachypnea. Cardiovascular risk factors: advanced age (older than 27 for men, 36 for women), dyslipidemia, hypertension and sedentary lifestyle. Use of agents associated with hypertension: none. History of target organ damage: none.  The following portions of the patient's history were reviewed and updated as appropriate: allergies, current medications, past family history, past medical history, past social history, past surgical history and problem list.  Review of Systems Pertinent items are noted in HPI.     Objective:    BP 136/79  Pulse 75  Temp(Src) 98.9 F (37.2 C) (Oral)  Wt 133 lb 12.8 oz (60.691 kg)  BMI 26.13 kg/m2  SpO2 95% General appearance: alert, cooperative, appears stated age and no distress Neck: no adenopathy, supple, symmetrical, trachea midline and thyroid not enlarged, symmetric, no tenderness/mass/nodules Lungs: clear to auscultation bilaterally Heart: S1, S2 normal Extremities: extremities normal, atraumatic, no cyanosis or edema    Assessment:    Hypertension, low last few visits . Evidence of target organ damage: none.    Plan:    Medication: decrease to losartan 25mg  1/2 qd . Dietary sodium restriction. Regular aerobic exercise. Check blood pressures 2-3 times weekly and record. Follow up: 6 months and as needed.  Subjective:

## 2013-04-21 NOTE — Assessment & Plan Note (Signed)
Unable to leave urine today Pt will drop off sample

## 2013-04-23 LAB — POCT URINALYSIS DIPSTICK
Bilirubin, UA: NEGATIVE
Blood, UA: NEGATIVE
Ketones, UA: NEGATIVE
Spec Grav, UA: >=1.03

## 2013-04-23 NOTE — Addendum Note (Signed)
Addended by: Silvio Pate D on: 04/23/2013 05:12 PM   Modules accepted: Orders

## 2013-04-27 ENCOUNTER — Other Ambulatory Visit: Payer: Self-pay | Admitting: Family Medicine

## 2013-04-27 DIAGNOSIS — N390 Urinary tract infection, site not specified: Secondary | ICD-10-CM

## 2013-04-27 LAB — URINE CULTURE: Colony Count: >=100000

## 2013-04-27 MED ORDER — LEVOFLOXACIN 250 MG PO TABS: 250.0000 mg | ORAL_TABLET | Freq: Every day | ORAL | Status: DC

## 2013-05-04 ENCOUNTER — Telehealth: Payer: Self-pay | Admitting: *Deleted

## 2013-05-04 NOTE — Telephone Encounter (Signed)
Spoke with pt after she called the office with concerns over UTI and ABX prescribed. Advised that abx prescribed should be sufficient to treat the uti but if she had any other  Sx to please call and we could see her again.

## 2013-05-23 ENCOUNTER — Encounter (HOSPITAL_COMMUNITY): Payer: Self-pay | Admitting: Emergency Medicine

## 2013-05-23 ENCOUNTER — Emergency Department (HOSPITAL_COMMUNITY): Payer: Medicare Other

## 2013-05-23 ENCOUNTER — Emergency Department (HOSPITAL_COMMUNITY)
Admission: EM | Admit: 2013-05-23 | Discharge: 2013-05-23 | Disposition: A | Discharge status: Home / Self Care | Payer: Medicare Other | Attending: Emergency Medicine | Admitting: Emergency Medicine

## 2013-05-23 DIAGNOSIS — Z79899 Other long term (current) drug therapy: Secondary | ICD-10-CM | POA: Insufficient documentation

## 2013-05-23 DIAGNOSIS — M79609 Pain in unspecified limb: Secondary | ICD-10-CM | POA: Insufficient documentation

## 2013-05-23 DIAGNOSIS — Z8673 Personal history of transient ischemic attack (TIA), and cerebral infarction without residual deficits: Secondary | ICD-10-CM | POA: Insufficient documentation

## 2013-05-23 DIAGNOSIS — G8929 Other chronic pain: Secondary | ICD-10-CM

## 2013-05-23 DIAGNOSIS — I10 Essential (primary) hypertension: Secondary | ICD-10-CM | POA: Insufficient documentation

## 2013-05-23 DIAGNOSIS — Z7982 Long term (current) use of aspirin: Secondary | ICD-10-CM | POA: Insufficient documentation

## 2013-05-23 DIAGNOSIS — R5381 Other malaise: Secondary | ICD-10-CM | POA: Insufficient documentation

## 2013-05-23 DIAGNOSIS — M810 Age-related osteoporosis without current pathological fracture: Secondary | ICD-10-CM | POA: Insufficient documentation

## 2013-05-23 DIAGNOSIS — M1990 Unspecified osteoarthritis, unspecified site: Secondary | ICD-10-CM | POA: Insufficient documentation

## 2013-05-23 DIAGNOSIS — E785 Hyperlipidemia, unspecified: Secondary | ICD-10-CM | POA: Insufficient documentation

## 2013-05-23 DIAGNOSIS — Z8672 Personal history of thrombophlebitis: Secondary | ICD-10-CM | POA: Insufficient documentation

## 2013-05-23 LAB — BASIC METABOLIC PANEL
BUN: 8 mg/dL (ref 6–23)
Chloride: 96 mEq/L (ref 96–112)
GFR calc Af Amer: >90 mL/min (ref >90)
Potassium: 3.7 mEq/L (ref 3.5–5.1)
Sodium: 130 mEq/L — ABNORMAL LOW (ref 135–145)

## 2013-05-23 LAB — CBC WITH DIFFERENTIAL/PLATELET
Basophils Absolute: 0 K/uL (ref 0.0–0.1)
Basophils Relative: 0 % (ref 0–1)
Eosinophils Absolute: 0 K/uL (ref 0.0–0.7)
Eosinophils Relative: 0 % (ref 0–5)
HCT: 38.1 % (ref 36.0–46.0)
Hemoglobin: 13.1 g/dL (ref 12.0–15.0)
Lymphocytes Relative: 9 % — ABNORMAL LOW (ref 12–46)
Lymphs Abs: 1 K/uL (ref 0.7–4.0)
MCH: 30.3 pg (ref 26.0–34.0)
MCHC: 34.4 g/dL (ref 30.0–36.0)
MCV: 88.2 fL (ref 78.0–100.0)
Monocytes Absolute: 0.8 K/uL (ref 0.1–1.0)
Monocytes Relative: 7 % (ref 3–12)
Neutro Abs: 9.4 K/uL — ABNORMAL HIGH (ref 1.7–7.7)
Neutrophils Relative %: 84 % — ABNORMAL HIGH (ref 43–77)
Platelets: 257 K/uL (ref 150–400)
RBC: 4.32 MIL/uL (ref 3.87–5.11)
RDW: 13.5 % (ref 11.5–15.5)
WBC: 11.3 K/uL — ABNORMAL HIGH (ref 4.0–10.5)

## 2013-05-23 LAB — URINALYSIS, ROUTINE W REFLEX MICROSCOPIC
Bilirubin Urine: NEGATIVE
Glucose, UA: NEGATIVE mg/dL
Hgb urine dipstick: NEGATIVE
Ketones, ur: NEGATIVE mg/dL
Leukocytes, UA: NEGATIVE
Nitrite: NEGATIVE
Protein, ur: NEGATIVE mg/dL
Specific Gravity, Urine: 1.014 (ref 1.005–1.030)
Urobilinogen, UA: 0.2 mg/dL (ref 0.0–1.0)
pH: 7.5 (ref 5.0–8.0)

## 2013-05-23 MED ORDER — MORPHINE SULFATE 4 MG/ML IJ SOLN
4.0000 mg | Freq: Once | INTRAMUSCULAR | Status: AC
  Administered 2013-05-23: 4 mg via INTRAVENOUS
  Filled 2013-05-23: qty 1

## 2013-05-23 MED ORDER — OXYCODONE-ACETAMINOPHEN 5-325 MG PO TABS: 1.0000 | ORAL_TABLET | Freq: Four times a day (QID) | ORAL | Status: DC | PRN

## 2013-05-23 MED ORDER — FENTANYL CITRATE 0.05 MG/ML IJ SOLN
50.0000 ug | Freq: Once | INTRAMUSCULAR | Status: AC
  Administered 2013-05-23: 50 ug via INTRAVENOUS
  Filled 2013-05-23: qty 2

## 2013-05-23 MED ORDER — SODIUM CHLORIDE 0.9 % IV BOLUS (SEPSIS)
500.0000 mL | Freq: Once | INTRAVENOUS | Status: AC
  Administered 2013-05-23: 500 mL via INTRAVENOUS

## 2013-05-23 NOTE — ED Notes (Signed)
Patient transported to X-ray 

## 2013-05-23 NOTE — ED Notes (Signed)
MD at bedside. 

## 2013-05-23 NOTE — ED Provider Notes (Signed)
CSN: [id number]     Arrival date & time 05/23/13  0932 History     First MD Initiated Contact with Patient 05/23/13 0935     Chief Complaint  Patient presents with  . Hip Pain   (Consider location/radiation/quality/duration/timing/severity/associated sxs/prior Treatment) The history is provided by the patient and a relative.  Gabriela Patel is a 79 y.o. female hx of HTN, previous stroke with L sided weakness, frequent UTIs here with L hip pain and weakness. L sided hip pain for the past several months, worse today. She went to the bathroom and had trouble walking. Denies falls. She states that she has been weaker on her left side intermittently for the last several weeks. Denies speech problems. Denies urinary symptoms but has frequent UTIs. She states that she had similar episode a year ago and she had UTI and was admitted and finished rehab.    Past Medical History  Diagnosis Date  . Hypertension   . Osteoporosis   . CVA (cerebral vascular accident) 11/01/2009  . Osteoarthritis   . Hyperlipidemia    Past Surgical History  Procedure Laterality Date  . Abdominal hysterectomy    . Cataract extraction      Left  . Vein ligation      Right Leg   Family History  Problem Relation Age of Onset  . Coronary artery disease    . Hip fracture Mother     B/L  . Heart attack Father   . Heart disease Brother    History  Substance Use Topics  . Smoking status: Never Smoker   . Smokeless tobacco: Never Used  . Alcohol Use: No     Comment: coffee .5 cup in the morning    OB History   Grav Para Term Preterm Abortions TAB SAB Ect Mult Living                 Review of Systems  Musculoskeletal:       L hip pain   Neurological: Positive for weakness.  All other systems reviewed and are negative.    Allergies  Review of patient's allergies indicates no known allergies.  Home Medications   Current Outpatient Rx  Name  Route  Sig  Dispense  Refill  . aspirin 81 MG tablet  Oral   Take 81 mg by mouth daily.           . Calcium Carbonate (CALTRATE 600 PO)   Oral   Take by mouth.         . fish oil-omega-3 fatty acids 1000 MG capsule   Oral   Take 2 g by mouth daily.         Marland Kitchen ibuprofen (ADVIL,MOTRIN) 200 MG tablet   Oral   Take 400 mg by mouth every 6 (six) hours as needed. For pain         . losartan (COZAAR) 25 MG tablet   Oral   Take 12.5 mg by mouth daily.         . Multiple Vitamin (MULTIVITAMIN WITH MINERALS) TABS   Oral   Take 1 tablet by mouth daily.         . pravastatin (PRAVACHOL) 20 MG tablet   Oral   Take 20 mg by mouth daily.         . sertraline (ZOLOFT) 100 MG tablet   Oral   Take 100 mg by mouth daily.          BP 177/75  Pulse  95  Temp(Src) 98.7 F (37.1 C) (Oral)  Resp 22  SpO2 98% Physical Exam  Nursing note and vitals reviewed. Constitutional: She is oriented to person, place, and time.  Chronically ill, slightly uncomfortable   HENT:  Head: Normocephalic and atraumatic.  Mouth/Throat: Oropharynx is clear and moist.  Eyes: Conjunctivae are normal. Pupils are equal, round, and reactive to light.  Neck: Normal range of motion. Neck supple.  Cardiovascular: Normal rate, regular rhythm and normal heart sounds.   Pulmonary/Chest: Effort normal and breath sounds normal. No respiratory distress. She has no wheezes. She has no rales.  Abdominal: Soft. Bowel sounds are normal. She exhibits no distension. There is no tenderness. There is no rebound and no guarding.  Musculoskeletal:  L hip dec ROM due to pain. Difficult lifting leg off the bed. 2+ pulses. Nl sensation. R leg nl ROM and unremarkable neurovascular exam.   Neurological: She is alert and oriented to person, place, and time.  Nl hand grasp bilaterally.   Skin: Skin is warm and dry.  Psychiatric: She has a normal mood and affect. Her behavior is normal. Judgment and thought content normal.    ED Course   Procedures (including critical care  time)  Labs Reviewed  CBC WITH DIFFERENTIAL - Abnormal; Notable for the following:    WBC 11.3 (*)    Neutrophils Relative % 84 (*)    Neutro Abs 9.4 (*)    Lymphocytes Relative 9 (*)    All other components within normal limits  BASIC METABOLIC PANEL - Abnormal; Notable for the following:    Sodium 130 (*)    Glucose, Bld 120 (*)    GFR calc non Af Amer 84 (*)    All other components within normal limits  URINE CULTURE  URINALYSIS, ROUTINE W REFLEX MICROSCOPIC   Dg Chest 2 View  05/23/2013   *RADIOLOGY REPORT*  Clinical Data: History of stroke.  Left-sided weakness.  CHEST - 2 VIEW  Comparison: 04/13/2012  Findings: Artifact overlies the chest.  The heart is at the upper limits of normal in size.  The aorta is unfolded.  The lungs are clear.  No edema or effusions.  Ordinary degenerative changes effect the spine.  IMPRESSION: No active cardiopulmonary disease.   Original Report Authenticated By: Paulina Fusi, M.D.   Dg Hip Complete Left  05/23/2013   *RADIOLOGY REPORT*  Clinical Data: Hip pain  LEFT HIP - COMPLETE 2+ VIEW  Comparison: None.  Findings: Three views of the left hip submitted.  Study is markedly limited by diffuse osteopenia.  No gross fracture or subluxation. Bilateral hip joints are symmetrical in appearance.  IMPRESSION: Markedly limited study by diffuse osteopenia.  No gross fracture or subluxation.  Bilateral hip joints are symmetrical in appearance.   Original Report Authenticated By: Natasha Mead, M.D.   Ct Head Wo Contrast  05/23/2013   *RADIOLOGY REPORT*  Clinical Data: Left-sided weakness.  Left hip pain.  CT HEAD WITHOUT CONTRAST  Technique:  Contiguous axial images were obtained from the base of the skull through the vertex without contrast.  Comparison: Head CT 04/13/2012.  MRI 11/02/2009.  Findings: There is generalized atrophy.  There are advanced chronic small vessel changes throughout the cerebral hemispheric white matter.  There are old thalamic and basal ganglia  infarctions. There are old ischemic changes within the brainstem.  No identifiably acute infarction, mass lesion, hemorrhage, hydrocephalus or extra-axial collection.  The calvarium is unremarkable.  Sinuses, middle ears and mastoids are clear.  IMPRESSION:  Extensive chronic ischemic changes throughout the brain.  No identifiably acute or subacute insult.   Original Report Authenticated By: Paulina Fusi, M.D.   No diagnosis found.  MDM  Gabriela Patel is a 79 y.o. female here with L hip pain, ? Weakness. Likely worsening arthritis. Will get UA to r/o UTI. Will get CT head to r/o stroke. Will get labs and reassess.   12:02 PM Felt better after pain meds. Xray showed osteopenia. CT showed no acute stroke. UA nl. I called case management to set up home care, aid, and PT. She will likely need hip replacement so will refer her to ortho and give short course of pain meds.    Richardean Canal, MD 05/23/13 [phone]

## 2013-05-23 NOTE — Care Management Note (Signed)
Cm spoke with patient, adult daughter, and son-n-law at the bedside concerning discharge planning. Pt currently lives alone. MD order for [facility] services. Per pt choice [facility] to provide [facility] services upon discharge. [facility] notified of referral. MD orders, facesheet, H/P faxed to [facility] at [phone]. Confirmation received. Pt request RW. DME delivery scheduled to room prior to discharge. Pt's adult daughter provided with private duty care information to assist with home care.   Roxy Manns Deverick Pruss,RN,MSN [phone]

## 2013-05-23 NOTE — ED Notes (Signed)
Patient with left hip pain which has been progressively getting worse.  Patient walks with a cane. No trauma, pain worse with ambulation.

## 2013-05-24 LAB — URINE CULTURE
Colony Count: NO GROWTH
Culture: NO GROWTH

## 2013-05-26 ENCOUNTER — Emergency Department (HOSPITAL_COMMUNITY)
Admission: EM | Admit: 2013-05-26 | Discharge: 2013-05-26 | Disposition: A | Discharge status: Rehabilitation Facility | Payer: Medicare Other | Attending: Emergency Medicine | Admitting: Emergency Medicine

## 2013-05-26 ENCOUNTER — Emergency Department (HOSPITAL_COMMUNITY): Payer: Medicare Other

## 2013-05-26 ENCOUNTER — Encounter (HOSPITAL_COMMUNITY): Payer: Self-pay | Admitting: Family Medicine

## 2013-05-26 DIAGNOSIS — Y929 Unspecified place or not applicable: Secondary | ICD-10-CM | POA: Insufficient documentation

## 2013-05-26 DIAGNOSIS — M1990 Unspecified osteoarthritis, unspecified site: Secondary | ICD-10-CM | POA: Insufficient documentation

## 2013-05-26 DIAGNOSIS — Z79899 Other long term (current) drug therapy: Secondary | ICD-10-CM | POA: Insufficient documentation

## 2013-05-26 DIAGNOSIS — Z7982 Long term (current) use of aspirin: Secondary | ICD-10-CM | POA: Insufficient documentation

## 2013-05-26 DIAGNOSIS — Z8673 Personal history of transient ischemic attack (TIA), and cerebral infarction without residual deficits: Secondary | ICD-10-CM | POA: Insufficient documentation

## 2013-05-26 DIAGNOSIS — I10 Essential (primary) hypertension: Secondary | ICD-10-CM | POA: Insufficient documentation

## 2013-05-26 DIAGNOSIS — Y939 Activity, unspecified: Secondary | ICD-10-CM | POA: Insufficient documentation

## 2013-05-26 DIAGNOSIS — E785 Hyperlipidemia, unspecified: Secondary | ICD-10-CM | POA: Insufficient documentation

## 2013-05-26 DIAGNOSIS — X58XXXA Exposure to other specified factors, initial encounter: Secondary | ICD-10-CM | POA: Insufficient documentation

## 2013-05-26 DIAGNOSIS — M810 Age-related osteoporosis without current pathological fracture: Secondary | ICD-10-CM | POA: Insufficient documentation

## 2013-05-26 DIAGNOSIS — S3210XA Unspecified fracture of sacrum, initial encounter for closed fracture: Secondary | ICD-10-CM

## 2013-05-26 LAB — CBC WITH DIFFERENTIAL/PLATELET
Basophils Absolute: 0 10*3/uL (ref 0.0–0.1)
Basophils Relative: 0 % (ref 0–1)
Eosinophils Absolute: 0 10*3/uL (ref 0.0–0.7)
Eosinophils Relative: 0 % (ref 0–5)
HCT: 37 % (ref 36.0–46.0)
Hemoglobin: 12.7 g/dL (ref 12.0–15.0)
MCH: 30.4 pg (ref 26.0–34.0)
MCHC: 34.3 g/dL (ref 30.0–36.0)
MCV: 88.5 fL (ref 78.0–100.0)
Monocytes Absolute: 0.7 10*3/uL (ref 0.1–1.0)
Monocytes Relative: 5 % (ref 3–12)
RDW: 13.5 % (ref 11.5–15.5)

## 2013-05-26 LAB — URINALYSIS, ROUTINE W REFLEX MICROSCOPIC
Glucose, UA: NEGATIVE mg/dL
Ketones, ur: >80 mg/dL — AB
Nitrite: NEGATIVE
Protein, ur: NEGATIVE mg/dL
pH: 6.5 (ref 5.0–8.0)

## 2013-05-26 LAB — BASIC METABOLIC PANEL
BUN: 10 mg/dL (ref 6–23)
Calcium: 8.6 mg/dL (ref 8.4–10.5)
Chloride: 99 mEq/L (ref 96–112)
Creatinine, Ser: 0.49 mg/dL — ABNORMAL LOW (ref 0.50–1.10)
GFR calc Af Amer: >90 mL/min (ref >90)

## 2013-05-26 LAB — URINE MICROSCOPIC-ADD ON

## 2013-05-26 MED ORDER — ONDANSETRON HCL 4 MG/2ML IJ SOLN
4.0000 mg | Freq: Once | INTRAMUSCULAR | Status: AC
  Administered 2013-05-26: 4 mg via INTRAVENOUS
  Filled 2013-05-26: qty 2

## 2013-05-26 MED ORDER — MORPHINE SULFATE 2 MG/ML IJ SOLN
2.0000 mg | Freq: Once | INTRAMUSCULAR | Status: AC
  Administered 2013-05-26: 2 mg via INTRAVENOUS
  Filled 2013-05-26: qty 1

## 2013-05-26 MED ORDER — POTASSIUM CHLORIDE CRYS ER 20 MEQ PO TBCR
20.0000 meq | EXTENDED_RELEASE_TABLET | Freq: Once | ORAL | Status: AC
  Administered 2013-05-26: 20 meq via ORAL
  Filled 2013-05-26: qty 1

## 2013-05-26 MED ORDER — MORPHINE SULFATE 2 MG/ML IJ SOLN: 2.0000 mg | INTRAMUSCULAR | Status: DC | PRN

## 2013-05-26 MED ORDER — LOSARTAN POTASSIUM 25 MG PO TABS
12.5000 mg | ORAL_TABLET | Freq: Every day | ORAL | Status: DC
  Administered 2013-05-26: 12.5 mg via ORAL
  Filled 2013-05-26: qty 0.5

## 2013-05-26 MED ORDER — SERTRALINE HCL 50 MG PO TABS
100.0000 mg | ORAL_TABLET | Freq: Every day | ORAL | Status: DC
  Administered 2013-05-26: 100 mg via ORAL
  Filled 2013-05-26: qty 2

## 2013-05-26 MED ORDER — OXYCODONE-ACETAMINOPHEN 5-325 MG PO TABS
1.0000 | ORAL_TABLET | Freq: Four times a day (QID) | ORAL | Status: DC | PRN
  Administered 2013-05-26: 1 via ORAL
  Filled 2013-05-26: qty 1

## 2013-05-26 MED ORDER — SIMVASTATIN 10 MG PO TABS
10.0000 mg | ORAL_TABLET | Freq: Every day | ORAL | Status: DC
  Filled 2013-05-26: qty 1

## 2013-05-26 NOTE — ED Provider Notes (Signed)
CSN: [id number]     Arrival date & time 05/26/13  7829 History     First MD Initiated Contact with Patient 05/26/13 [phone]     Chief Complaint  Patient presents with  . Hip Pain    Left   (Consider location/radiation/quality/duration/timing/severity/associated sxs/prior Treatment) HPI Comments: Patient presents to the ER for evaluation of left hip pain. Patient was seen in the ED 2 days ago with similar complaints. Patient was started on pain medication. She has been afraid to take much of the medicine because she did not want to become addicted. She has been taking one or 2 tablets a day. She is having increased pain, now complains of severe pain in left hip that worsens with any movement. She denies any injury. No numbness or tingling in the lower extremities. No weakness in her extremities. No change in bowel or bladder function. She has followup scheduled in 2 days with orthopedics to consider hip replacement.  Patient is a 79 y.o. female presenting with hip pain.  Hip Pain    Past Medical History  Diagnosis Date  . Hypertension   . Osteoporosis   . CVA (cerebral vascular accident) 11/01/2009  . Osteoarthritis   . Hyperlipidemia    Past Surgical History  Procedure Laterality Date  . Abdominal hysterectomy    . Cataract extraction      Left  . Vein ligation      Right Leg   Family History  Problem Relation Age of Onset  . Coronary artery disease    . Hip fracture Mother     B/L  . Heart attack Father   . Heart disease Brother    History  Substance Use Topics  . Smoking status: Never Smoker   . Smokeless tobacco: Never Used  . Alcohol Use: No   OB History   Grav Para Term Preterm Abortions TAB SAB Ect Mult Living                 Review of Systems  Constitutional: Negative for fever.  Musculoskeletal: Positive for arthralgias.  All other systems reviewed and are negative.    Allergies  Review of patient's allergies indicates no known allergies.  Home  Medications   Current Outpatient Rx  Name  Route  Sig  Dispense  Refill  . aspirin 81 MG tablet   Oral   Take 81 mg by mouth daily.           . Calcium Carbonate (CALTRATE 600 PO)   Oral   Take by mouth.         . fish oil-omega-3 fatty acids 1000 MG capsule   Oral   Take 2 g by mouth daily.         Marland Kitchen ibuprofen (ADVIL,MOTRIN) 200 MG tablet   Oral   Take 400 mg by mouth every 6 (six) hours as needed. For pain         . losartan (COZAAR) 25 MG tablet   Oral   Take 12.5 mg by mouth daily.         . Multiple Vitamin (MULTIVITAMIN WITH MINERALS) TABS   Oral   Take 1 tablet by mouth daily.         Marland Kitchen oxyCODONE-acetaminophen (PERCOCET) 5-325 MG per tablet   Oral   Take 1 tablet by mouth every 6 (six) hours as needed for pain.   15 tablet   0   . pravastatin (PRAVACHOL) 20 MG tablet   Oral   Take  20 mg by mouth daily.         . sertraline (ZOLOFT) 100 MG tablet   Oral   Take 100 mg by mouth daily.          BP 156/64  Pulse 76  Temp(Src) 98.4 F (36.9 C) (Oral)  Resp 18  SpO2 90% Physical Exam  Constitutional: She is oriented to person, place, and time. She appears well-developed and well-nourished. No distress.  HENT:  Head: Normocephalic and atraumatic.  Right Ear: Hearing normal.  Left Ear: Hearing normal.  Nose: Nose normal.  Mouth/Throat: Oropharynx is clear and moist and mucous membranes are normal.  Eyes: Conjunctivae and EOM are normal. Pupils are equal, round, and reactive to light.  Neck: Normal range of motion. Neck supple.  Cardiovascular: Regular rhythm, S1 normal and S2 normal.  Exam reveals no gallop and no friction rub.   No murmur heard. Pulmonary/Chest: Effort normal and breath sounds normal. No respiratory distress. She exhibits no tenderness.  Abdominal: Soft. Normal appearance and bowel sounds are normal. There is no hepatosplenomegaly. There is no tenderness. There is no rebound, no guarding, no tenderness at McBurney's point  and negative Murphy's sign. No hernia.  Musculoskeletal:       Left hip: She exhibits decreased range of motion and tenderness. She exhibits no bony tenderness, no swelling and no deformity.       Lumbar back: Normal.  Neurological: She is alert and oriented to person, place, and time. She has normal strength. No cranial nerve deficit or sensory deficit. Coordination normal. GCS eye subscore is 4. GCS verbal subscore is 5. GCS motor subscore is 6.  Skin: Skin is warm, dry and intact. No rash noted. No cyanosis.  Psychiatric: She has a normal mood and affect. Her speech is normal and behavior is normal. Thought content normal.    ED Course   Procedures (including critical care time)  Labs Reviewed - No data to display Ct Hip Left Wo Contrast  05/26/2013   *RADIOLOGY REPORT*  Clinical Data: Continued left hip pain.  No injury.  CT OF THE LEFT HIP WITHOUT CONTRAST  Technique:  Multidetector CT imaging was performed according to the standard protocol. Multiplanar CT image reconstructions were also generated.  Comparison: Left hip radiographs 05/23/2013  Findings: The left hip appears intact.  No evidence of acute fracture or subluxation.  No focal bone lesion or bone destruction. The bone cortex and trabecular architecture appear intact.  No soft tissue infiltration or fluid.  A Foley catheter is demonstrated in the bladder.  IMPRESSION: Normal appearance of the left hip.  No acute bony abnormalities.   Original Report Authenticated By: Burman Nieves, M.D.   Diagnosis: Left hip pain  MDM  Patient presents to the ER with progressively worsening pain in the left hip area. The area is tender and she has decreased range of motion. When she moves she has significant spasms around the left hip joint, worsening her pain. X-ray from 2 days ago did show degenerative changes. Patient is not maximally treating her pain with the analgesics provided.  Patient had CT scan of left hip to rule out occult  fracture and further evaluate. CT scan does not show any significant problems, but certainly no fracture or dislocation. Examination is more concerning for soft tissue pain and spasm. Pain is in the proximal thigh area with movement. She will undergo MRI of lumbar spine to rule out significant lumbar spine and disc disease causing the symptoms.  Patient treated  with morphine here in the ER. She has not able to get adequate pain control. She is not likely a candidate for admission, but is already having home health visits at home and is having difficulty getting up to go to the bathroom, could not walk. She'll likely require social service consult for rehabilitation placement.    Gilda Crease, MD 05/26/13 [phone]

## 2013-05-26 NOTE — ED Notes (Signed)
Her son-in-law tells me he is quite concerned re:  Their ability to car for pt. At home.  Her daughter has been living with her for about two months; and at her current condition/pain level it is becoming impossible for them to manage her.  He enquires about possible rehab. Facilities, etc. For the forseable future.  She remains in no distress.

## 2013-05-26 NOTE — ED Notes (Signed)
Upon asking Dr. Lynelle Doctor; he instucts [place] to remove her foley catheter, which we do.

## 2013-05-26 NOTE — ED Notes (Signed)
I have just called report to [facility], Charity fundraiser at [facility]; and are awaiting the arrival of PTAR.

## 2013-05-26 NOTE — ED Notes (Signed)
At this time, I have yet to receive report; and see her being transported to MR by radiology personnel.  Pt. Is awake, alert and smiling.

## 2013-05-26 NOTE — ED Notes (Signed)
She is awaiting PTAR for tx to [facility].  She is in no distress.

## 2013-05-26 NOTE — ED Notes (Signed)
Patient states that the last time she went to the bathroom was at 0200. States that it was difficult and painful for her to get to bathroom with her walker. Informed patient that it is not ER policy to insert catheters while the patient is in the ER due to increased chance of catheter associated infections. Offered to place on bedpan or to bedside commode and patient adamantly declined. Patient states "I will pay for a catheter." Dr. Blinda Leatherwood informed of patient's request for catheter.

## 2013-05-26 NOTE — Progress Notes (Addendum)
CSW met with pt and pt family at bedside to discuss options and admission status. Pt and pt family agreed into skilled nursing placement for short term rehab private pay. Patient and pt family interested in [facility], however prefers [facility]. CSW spoke with Eber Jones at [facility] who stated that a bed may be available and is reviewing clinical information. CSW awaiting return call and other bed offers. Patient has exsisting [facility] number  [phone] [id number] and is medically stable for placement pending bed offers.   Catha Gosselin, LCSW [phone]  ED CSW .05/26/2013 1018am   Pt accepted to [facility], pt family completed paperwork and deposit. Pt room to be ready at 2pm. Pt to be transferred by ptar, called by CSW. RN aware and MD. Theola Sequin can call report to [phone].   Catha Gosselin, LCSW [phone]  ED CSW 05/26/2013. 1354pm

## 2013-05-26 NOTE — ED Notes (Signed)
Per EMS, patient here for evaluation of left hip pain. Patient was discharged from the hospital on Sunday for same pain. Patient has an appointment with an orthopedic doctor on Thursday but states that the pain is unbearable.

## 2013-05-26 NOTE — ED Notes (Signed)
Patient transported to CT 

## 2013-05-26 NOTE — ED Provider Notes (Addendum)
Mr Lumbar Spine Wo Contrast  05/26/2013   *RADIOLOGY REPORT*  Clinical Data: Low back and severe left hip pain.  MRI LUMBAR SPINE WITHOUT CONTRAST  Technique:  Multiplanar and multiecho pulse sequences of the lumbar spine were obtained without intravenous contrast.  Comparison: MRI lumbar spine 07/25/2007.  Findings: There is marrow edema in the sacral ala bilaterally, worse on the left, due to sacral insufficiency fractures.  The patient has multiple remote lumbar spine compression fractures as seen on the prior study.  There is no acute lumbar spine fracture. Trace anterolisthesis of L4 on L5 due to facet arthropathy is unchanged.  Alignment is otherwise unremarkable.  Paraspinous structures demonstrate T2 hyperintense lesions in the kidneys consistent with cysts, unchanged.  T10-11:  Minimal bony retropulsion off the superior endplate of T11 is unchanged.  The central canal is open.  T11-12:  Negative.  T12-L1:  Mild bony retropulsion off the superior endplate of L1 is unchanged.  L1-2:  Shallow central/right paracentral protrusion is again seen. The central canal and foramina are open.  L2-3:  There is a mild disc bulge without central canal or foraminal narrowing.  L3-4:  No change in a mild disc bulge without central canal or foraminal narrowing.  L4-5:  Bilateral facet degenerative disease is identified with small facet joint effusions.  There is mild to moderate central canal narrowing due ligamentum flavum thickening and a shallow bulge.  L5-S1:  Central disc protrusion causing mild to moderate central canal narrowing is unchanged.  Mild left foraminal narrowing is identified.  Right foramen is open.  IMPRESSION:  1.  Acute or subacute bilateral sacral ala insufficiency fractures, worse on the left. 2.  Multiple remote lumbar spine fractures.  No acute lumbar fracture. 3.  No change in degenerative disease as described above.   Original Report Authenticated By: Holley Dexter, M.D.   Ct Hip Left Wo  Contrast  05/26/2013   *RADIOLOGY REPORT*  Clinical Data: Continued left hip pain.  No injury.  CT OF THE LEFT HIP WITHOUT CONTRAST  Technique:  Multidetector CT imaging was performed according to the standard protocol. Multiplanar CT image reconstructions were also generated.  Comparison: Left hip radiographs 05/23/2013  Findings: The left hip appears intact.  No evidence of acute fracture or subluxation.  No focal bone lesion or bone destruction. The bone cortex and trabecular architecture appear intact.  No soft tissue infiltration or fluid.  A Foley catheter is demonstrated in the bladder.  IMPRESSION: Normal appearance of the left hip.  No acute bony abnormalities.   Original Report Authenticated By: Burman Nieves, M.D.    Labs Reviewed  CBC WITH DIFFERENTIAL - Abnormal; Notable for the following:    WBC 13.0 (*)    Neutrophils Relative % 89 (*)    Neutro Abs 11.5 (*)    Lymphocytes Relative 6 (*)    All other components within normal limits  BASIC METABOLIC PANEL - Abnormal; Notable for the following:    Sodium 133 (*)    Potassium 3.3 (*)    Glucose, Bld 120 (*)    Creatinine, Ser 0.49 (*)    GFR calc non Af Amer 87 (*)    All other components within normal limits  URINALYSIS, ROUTINE W REFLEX MICROSCOPIC - Abnormal; Notable for the following:    Bilirubin Urine SMALL (*)    Ketones, ur >80 (*)    Leukocytes, UA SMALL (*)    All other components within normal limits  URINE MICROSCOPIC-ADD ON  I discussed the findings with the patient and her family. The CT scan shows subacute sacral ala insufficiency fractures, worse on the left which does account for her left-sided pain. Typically these injuries do not require any particular intervention. I will consult with orthopedics.  Patient will require placement into some type of nursing/rehabilitation facility. Patient is unable to care for herself despite the assistance of her family at this time. We will attempt to have her placed in  a facility from the emergency department today. If we're unable to do so I will consult with the medical service regarding admission for observation and pain to management until that can be arranged    Celene Kras, MD 05/26/13 [phone]  Social work has arranged for the patient to be transported to a nursing rehab facility.  Celene Kras, MD 05/26/13 [phone]

## 2013-05-27 ENCOUNTER — Non-Acute Institutional Stay (SKILLED_NURSING_FACILITY): Payer: Medicare Other | Admitting: Internal Medicine

## 2013-05-27 DIAGNOSIS — F329 Major depressive disorder, single episode, unspecified: Secondary | ICD-10-CM

## 2013-05-27 DIAGNOSIS — I10 Essential (primary) hypertension: Secondary | ICD-10-CM

## 2013-05-27 DIAGNOSIS — Z8679 Personal history of other diseases of the circulatory system: Secondary | ICD-10-CM

## 2013-05-27 DIAGNOSIS — S3210XS Unspecified fracture of sacrum, sequela: Secondary | ICD-10-CM

## 2013-05-27 DIAGNOSIS — IMO0002 Reserved for concepts with insufficient information to code with codable children: Secondary | ICD-10-CM

## 2013-06-09 ENCOUNTER — Other Ambulatory Visit: Payer: Self-pay

## 2013-06-09 MED ORDER — OXYCODONE-ACETAMINOPHEN 5-325 MG PO TABS: ORAL_TABLET | ORAL | Status: DC

## 2013-06-09 NOTE — Addendum Note (Signed)
Addended by: Maurice Small on: 06/09/2013 04:59 PM   Modules accepted: Orders

## 2013-06-09 NOTE — Telephone Encounter (Signed)
Per Shanda Bumps change dispense number to [phone]

## 2013-06-09 NOTE — Telephone Encounter (Signed)
Verified dose and instructions reflect manual request received by nursing home.   

## 2013-06-28 DIAGNOSIS — F329 Major depressive disorder, single episode, unspecified: Secondary | ICD-10-CM | POA: Insufficient documentation

## 2013-06-28 DIAGNOSIS — S3210XA Unspecified fracture of sacrum, initial encounter for closed fracture: Secondary | ICD-10-CM | POA: Insufficient documentation

## 2013-06-28 NOTE — Progress Notes (Signed)
Patient ID: Gabriela Patel, female   DOB: 25-Oct-1926, 79 y.o.   MRN: [id number]        HISTORY & PHYSICAL  DATE: 05/27/2013   FACILITY: [facility]  LEVEL OF CARE: SNF (31)  ALLERGIES:  No Known Allergies  CHIEF COMPLAINT:  Manage sacral fracture, hypertension, and CVA.    HISTORY OF PRESENT ILLNESS:  This patient sustained an insufficiency fracture of the sacral ala, left greater than right.  She was hospitalized and, after hospitalization, she is admitted to this facility for short-term rehabilitation.  She does complain of sacral pain.    HTN: Pt 's HTN remains stable.  Denies CP, sob, DOE, pedal edema, headaches, dizziness or visual disturbances.  No complications from the medications currently being used.  Last BP :  150/78.    CVA: The patient's CVA remains stable.  Patient denies new neurologic symptoms such as numbness, tingling, weakness, speech difficulties or visual disturbances.  No complications reported from the medications currently being used.   PAST MEDICAL HISTORY :  Past Medical History  Diagnosis Date  . Hypertension   . Osteoporosis   . CVA (cerebral vascular accident) 11/01/2009  . Osteoarthritis   . Hyperlipidemia     PAST SURGICAL HISTORY: Past Surgical History  Procedure Laterality Date  . Abdominal hysterectomy    . Cataract extraction      Left  . Vein ligation      Right Leg    SOCIAL HISTORY:  reports that she has never smoked. She has never used smokeless tobacco. She reports that she does not drink alcohol or use illicit drugs.  FAMILY HISTORY:  Family History  Problem Relation Age of Onset  . Coronary artery disease    . Hip fracture Mother     B/L  . Heart attack Father   . Heart disease Brother     CURRENT MEDICATIONS: Reviewed per [facility]  REVIEW OF SYSTEMS:  See HPI otherwise 14 point ROS is negative.  PHYSICAL EXAMINATION  VS:  T 97.8       P 79      RR 18      BP 150/78      POX 98%        WT  (Lb)  GENERAL: no acute distress, normal body habitus EYES: conjunctivae normal, sclerae normal, normal eye lids MOUTH/THROAT: lips without lesions,no lesions in the mouth,tongue is without lesions,uvula elevates in midline NECK: supple, trachea midline, no neck masses, no thyroid tenderness, no thyromegaly LYMPHATICS: no LAN in the neck, no supraclavicular LAN RESPIRATORY: breathing is even & unlabored, BS CTAB CARDIAC: RRR, no murmur,no extra heart sounds, no edema GI:  ABDOMEN: abdomen soft, normal BS, no masses, no tenderness  LIVER/SPLEEN: no hepatomegaly, no splenomegaly MUSCULOSKELETAL: HEAD: normal to inspection & palpation BACK: no kyphosis, scoliosis or spinal processes tenderness EXTREMITIES: LEFT UPPER EXTREMITY: full range of motion, normal strength & tone RIGHT UPPER EXTREMITY:  full range of motion, normal strength & tone LEFT LOWER EXTREMITY: strength intact, range of motion moderate, range of motion testing elicits sacral pain   RIGHT LOWER EXTREMITY: strength intact, range of motion moderate, range of motion testing elicits sacral pain   PSYCHIATRIC: the patient is alert & oriented to person, affect & behavior appropriate  LABS/RADIOLOGY: BMP normal.    CBC normal.    ASSESSMENT/PLAN:  Sacral fracture.  Continue rehabilitation and pain management.    Hypertension.  Last blood pressure elevated, likely due to pain.  We  will monitor.    CVA.  Continue supportive care.    Depression.  Continue Zoloft.    Hyperlipidemia.  Continue Pravachol.    Check CBC and BMP.    I have reviewed patient's medical records received at admission/from hospitalization.  CPT CODE: 16109

## 2013-07-08 DIAGNOSIS — R42 Dizziness and giddiness: Secondary | ICD-10-CM

## 2013-07-08 DIAGNOSIS — M25559 Pain in unspecified hip: Secondary | ICD-10-CM

## 2013-07-08 DIAGNOSIS — M1990 Unspecified osteoarthritis, unspecified site: Secondary | ICD-10-CM

## 2013-07-08 DIAGNOSIS — IMO0001 Reserved for inherently not codable concepts without codable children: Secondary | ICD-10-CM

## 2013-07-13 ENCOUNTER — Non-Acute Institutional Stay (SKILLED_NURSING_FACILITY): Payer: Medicare Other | Admitting: Adult Health

## 2013-07-13 DIAGNOSIS — E785 Hyperlipidemia, unspecified: Secondary | ICD-10-CM

## 2013-07-13 DIAGNOSIS — S3210XD Unspecified fracture of sacrum, subsequent encounter for fracture with routine healing: Secondary | ICD-10-CM

## 2013-07-13 DIAGNOSIS — IMO0001 Reserved for inherently not codable concepts without codable children: Secondary | ICD-10-CM

## 2013-07-13 DIAGNOSIS — I10 Essential (primary) hypertension: Secondary | ICD-10-CM

## 2013-07-13 DIAGNOSIS — M810 Age-related osteoporosis without current pathological fracture: Secondary | ICD-10-CM

## 2013-07-13 DIAGNOSIS — F329 Major depressive disorder, single episode, unspecified: Secondary | ICD-10-CM

## 2013-07-21 ENCOUNTER — Ambulatory Visit: Payer: Medicare Other | Admitting: Nurse Practitioner

## 2013-07-22 ENCOUNTER — Ambulatory Visit: Payer: Medicare Other | Admitting: Family Medicine

## 2013-07-26 DIAGNOSIS — IMO0001 Reserved for inherently not codable concepts without codable children: Secondary | ICD-10-CM

## 2013-07-26 DIAGNOSIS — M810 Age-related osteoporosis without current pathological fracture: Secondary | ICD-10-CM

## 2013-07-26 DIAGNOSIS — M1990 Unspecified osteoarthritis, unspecified site: Secondary | ICD-10-CM

## 2013-07-26 DIAGNOSIS — M25559 Pain in unspecified hip: Secondary | ICD-10-CM

## 2013-07-27 ENCOUNTER — Encounter: Payer: Self-pay | Admitting: Family Medicine

## 2013-07-27 ENCOUNTER — Ambulatory Visit (INDEPENDENT_AMBULATORY_CARE_PROVIDER_SITE_OTHER): Payer: Medicare Other | Admitting: Family Medicine

## 2013-07-27 VITALS — BP 120/72 | HR 84 | Temp 99.1°F | Resp 16 | Wt 123.4 lb

## 2013-07-27 DIAGNOSIS — I10 Essential (primary) hypertension: Secondary | ICD-10-CM

## 2013-07-27 DIAGNOSIS — Z1239 Encounter for other screening for malignant neoplasm of breast: Secondary | ICD-10-CM

## 2013-07-27 DIAGNOSIS — M810 Age-related osteoporosis without current pathological fracture: Secondary | ICD-10-CM | POA: Insufficient documentation

## 2013-07-27 DIAGNOSIS — M8580 Other specified disorders of bone density and structure, unspecified site: Secondary | ICD-10-CM

## 2013-07-27 DIAGNOSIS — S3210XA Unspecified fracture of sacrum, initial encounter for closed fracture: Secondary | ICD-10-CM

## 2013-07-27 DIAGNOSIS — M899 Disorder of bone, unspecified: Secondary | ICD-10-CM

## 2013-07-27 DIAGNOSIS — E2839 Other primary ovarian failure: Secondary | ICD-10-CM

## 2013-07-27 DIAGNOSIS — E785 Hyperlipidemia, unspecified: Secondary | ICD-10-CM

## 2013-07-27 NOTE — Assessment & Plan Note (Signed)
Probably has progressed to osteoporosis Check bmd

## 2013-07-27 NOTE — Assessment & Plan Note (Signed)
Per ortho and PT

## 2013-07-27 NOTE — Progress Notes (Signed)
Patient ID: Gabriela Patel, female   DOB: 04-23-1927, 79 y.o.   MRN: [id number]       PROGRESS NOTE  DATE: 07/13/2013   FACILITY: [facility]  LEVEL OF CARE: SNF (31)  Acute Visit  CHIEF COMPLAINT:  Discharge Notes  HISTORY OF PRESENT ILLNESS: This is an 79 year old female who is for discharge home with Home health PT and OT: DME: Youth rolling walker due to unsteady gait. She was admitted to [facility] on 05/26/13 from [facility] with left hip pain due to an insufficiency fracture of the sacral ala left greater than right. Patient was admitted to this facility for short-term rehabilitation after the patient's recent hospitalization.  Patient has completed SNF rehabilitation and therapy has cleared the patient for discharge.  Reassessment of ongoing problem(s):  HTN: Pt 's HTN remains stable.  Denies CP, sob, DOE, pedal edema, headaches, dizziness or visual disturbances.  No complications from the medications currently being used.  Last BP : 142/81  DEPRESSION: The depression remains stable. Patient denies ongoing feelings of sadness, insomnia, anedhonia or lack of appetite. No complications reported from the medications currently being used. Staff do not report behavioral problems.  OSTEOPOROSIS: The patient's osteoporosis remains stable. The patient denies recent worsening of pain and the range of motion remains stable. There has not been any evidence of fractures. No complications noted from the medications presently being used.   PAST MEDICAL HISTORY : Reviewed.  No changes.  CURRENT MEDICATIONS: Reviewed per [facility]  REVIEW OF SYSTEMS:  GENERAL: no change in appetite, no fatigue, no weight changes, no fever, chills or weakness RESPIRATORY: no cough, SOB, DOE, wheezing, hemoptysis CARDIAC: no chest pain, edema or palpitations GI: no abdominal pain, diarrhea, constipation, heart burn, nausea or vomiting  PHYSICAL EXAMINATION  VS:  T97.4       P85        RR20      BP142/81      POX92 %       WT126 (Lb)  GENERAL: no acute distress, normal body habitus EYES: conjunctivae normal, sclerae normal, normal eye lids NECK: supple, trachea midline, no neck masses, no thyroid tenderness, no thyromegaly LYMPHATICS: no LAN in the neck, no supraclavicular LAN RESPIRATORY: breathing is even & unlabored, BS CTAB CARDIAC: RRR, no murmur,no extra heart sounds, no edema GI: abdomen soft, normal BS, no masses, no tenderness, no hepatomegaly, no splenomegaly PSYCHIATRIC: the patient is alert & oriented to person, affect & behavior appropriate  LABS/RADIOLOGY: 06/16/13 WBC 7.3 hemoglobin 11.7 hematocrit 37.6 06/08/13 WBC 11.2 hemoglobin 11.4 hematocrit 35.3 05/28/13 WBC 12.1 hemoglobin 12.5 hematocrit 38.7 sodium 132 potassium 3.9 glucose 96 BUN 11 creatinine 0.5 calcium 8.5   ASSESSMENT/PLAN:  Sacral insufficiency fracture ALA left greater than right - for home health PT and OT  Hypertension - well controlled; continue Cozaar 25 mg one tablet by mouth daily  Hyperlipidemia - continue Lipitor 10 mg one tab by mouth daily  Depression - continue Zoloft 100 mg one tab by mouth daily  Osteoporosis - continue calcium supplementation   I have filled out patient's discharge paperwork and written prescriptions.  Patient will receive home health PT and OT.  DME provided: youth rolling walker  Total discharge time: Greater than 30 minutes Discharge time involved coordination of the discharge process with social worker, nursing staff and therapy department. Medical justification for home health services/DME verified.   CPT CODE: 08657

## 2013-07-27 NOTE — Patient Instructions (Signed)

## 2013-07-27 NOTE — Progress Notes (Signed)
  Subjective:    Patient here for follow-up of elevated blood pressure.  She is exercising and is adherent to a low-salt diet.  Blood pressure is well controlled at home. Cardiac symptoms: none. Patient denies: chest pain, chest pressure/discomfort, claudication, dyspnea, exertional chest pressure/discomfort, irregular heart beat, lower extremity edema, near-syncope, orthopnea, palpitations, paroxysmal nocturnal dyspnea, syncope and tachypnea. Cardiovascular risk factors: advanced age (older than 33 for men, 11 for women), dyslipidemia and hypertension. Use of agents associated with hypertension: none. History of target organ damage: stroke.  The following portions of the patient's history were reviewed and updated as appropriate: allergies, current medications, past family history, past medical history, past social history, past surgical history and problem list.  Review of Systems Pertinent items are noted in HPI.     Objective:    BP 120/72  Pulse 84  Temp(Src) 99.1 F (37.3 C) (Oral)  Resp 16  Wt 123 lb 6 oz (55.963 kg)  BMI 24.1 kg/m2  SpO2 96% General appearance: alert, cooperative, appears stated age and no distress Neck: no adenopathy, supple, symmetrical, trachea midline and thyroid not enlarged, symmetric, no tenderness/mass/nodules Lungs: clear to auscultation bilaterally Heart: S1, S2 normal Extremities: extremities normal, atraumatic, no cyanosis or edema    Assessment:    Hypertension, normal blood pressure . Evidence of target organ damage: stroke.    Plan:    Medication: no change. Dietary sodium restriction. Regular aerobic exercise. Follow up: 6 months and as needed.

## 2013-07-27 NOTE — Assessment & Plan Note (Signed)
Check labs Change back to pravastatin---due to cost and it was effective

## 2013-07-28 ENCOUNTER — Other Ambulatory Visit (INDEPENDENT_AMBULATORY_CARE_PROVIDER_SITE_OTHER): Payer: Medicare Other

## 2013-07-28 DIAGNOSIS — E785 Hyperlipidemia, unspecified: Secondary | ICD-10-CM

## 2013-07-28 DIAGNOSIS — I10 Essential (primary) hypertension: Secondary | ICD-10-CM

## 2013-07-28 LAB — HEPATIC FUNCTION PANEL
AST: 26 U/L (ref 0–37)
Alkaline Phosphatase: 79 U/L (ref 39–117)
Bilirubin, Direct: 0 mg/dL (ref 0.0–0.3)
Total Bilirubin: 0.6 mg/dL (ref 0.3–1.2)

## 2013-07-28 LAB — LIPID PANEL: Total CHOL/HDL Ratio: 2

## 2013-07-28 LAB — BASIC METABOLIC PANEL
BUN: 7 mg/dL (ref 6–23)
CO2: 30 mEq/L (ref 19–32)
Calcium: 9.3 mg/dL (ref 8.4–10.5)
Creatinine, Ser: 0.7 mg/dL (ref 0.4–1.2)
Glucose, Bld: 106 mg/dL — ABNORMAL HIGH (ref 70–99)

## 2013-07-29 ENCOUNTER — Ambulatory Visit (INDEPENDENT_AMBULATORY_CARE_PROVIDER_SITE_OTHER): Payer: Medicare Other | Admitting: Nurse Practitioner

## 2013-07-29 ENCOUNTER — Encounter: Payer: Self-pay | Admitting: Nurse Practitioner

## 2013-07-29 VITALS — BP 132/73 | HR 79 | Ht 60.0 in | Wt 154.0 lb

## 2013-07-29 DIAGNOSIS — E785 Hyperlipidemia, unspecified: Secondary | ICD-10-CM

## 2013-07-29 DIAGNOSIS — Z8679 Personal history of other diseases of the circulatory system: Secondary | ICD-10-CM

## 2013-07-29 DIAGNOSIS — I6350 Cerebral infarction due to unspecified occlusion or stenosis of unspecified cerebral artery: Secondary | ICD-10-CM

## 2013-07-29 DIAGNOSIS — I10 Essential (primary) hypertension: Secondary | ICD-10-CM

## 2013-07-29 NOTE — Progress Notes (Signed)
[facility]  PATIENT: Renaldo Reel DOB: 12-Oct-1926   REASON FOR VISIT: Followup history of stroke   HISTORY OF PRESENT ILLNESS: Ms. Zuver, 79 year old white female returns for followup she has a history of right internal capsule lacunar infarct in January of 2011 from small vessel disease with risk factors of hypertension and mild hyperlipidemia. She denies double vision, loss of vision, numbness, speech or swallowing problems, confusions, blackouts. She does however complain of left leg heaviness, and she does not feel motivated to do anything. This has continued since her stroke. She is taking aspirin with minimal bruising She also recently had a sacral fracture and spent 2 months in rehabilitation. She is going to [place] in a few weeks. She is no longer driving and is accompanied by her daughter today.    HISTORY: Right internal capsule lacunar infarct in Jnuary 2011 from small vessel disease with risk factors of only HTN and  mild Hyperlipidimia..She has persistent left leg heaviness but no objective signs of weakness or gait instability on exam today. Left hand paresthesias CTS per [facility].  She returns for followup after last visit on 09/18/11. She was admitted to [facility] long hospital in July with urinart tract infection and worsening of her gait. She was transferred to [facility] for rehabilitation and has recently been discharged from home physical therapy and is doing well. She is walking independently but does drag her left foot a little. She has noticed improvement in the paresthesias in her left hand which are not bothersome. She has not had any recent falls. She has no other new complaints. She wants to start driving again   REVIEW OF SYSTEMS: Full 14 system review of systems performed and notable only for:  Constitutional: fatigue Cardiovascular: N/A  Ear/Nose/Throat: N/A  Skin: N/A  Eyes: N/A  Respiratory: N/A  Gastroitestinal: N/A    Hematology/Lymphatic: N/A  Endocrine: N/A Musculoskeletal:N/A  Allergy/Immunology: N/A  Neurological: weakness Psychiatric: anxiety   ALLERGIES: No Known Allergies  HOME MEDICATIONS: Outpatient Prescriptions Prior to Visit  Medication Sig Dispense Refill  . aspirin 81 MG tablet Take 81 mg by mouth daily.        . Calcium Carbonate (CALTRATE 600 PO) Take by mouth.      Marland Kitchen ibuprofen (ADVIL,MOTRIN) 200 MG tablet Take 400 mg by mouth every 6 (six) hours as needed. For pain      . losartan (COZAAR) 25 MG tablet Take 12.5 mg by mouth daily.      . Multiple Vitamin (MULTIVITAMIN WITH MINERALS) TABS Take 1 tablet by mouth daily.      . pravastatin (PRAVACHOL) 20 MG tablet Take 20 mg by mouth daily.      . sertraline (ZOLOFT) 100 MG tablet Take 150 mg by mouth daily.       Marland Kitchen oxyCODONE-acetaminophen (PERCOCET) 5-325 MG per tablet 1 by mouth every 6 hours **DO NOT EXCEED 4 GM OF TYLENOL IN 24 HOURS**  120 tablet  0   No facility-administered medications prior to visit.    PAST MEDICAL HISTORY: Past Medical History  Diagnosis Date  . Hypertension   . Osteoporosis   . CVA (cerebral vascular accident) 11/01/2009  . Osteoarthritis   . Hyperlipidemia     PAST SURGICAL HISTORY: Past Surgical History  Procedure Laterality Date  . Abdominal hysterectomy    . Cataract extraction      Left  . Vein ligation      Right Leg    FAMILY HISTORY: Family History  Problem Relation Age of Onset  . Coronary artery disease    . Hip fracture Mother     B/L  . Heart attack Father   . Heart disease Brother     SOCIAL HISTORY: History   Social History  . Marital Status: Single    Spouse Name: N/A    Number of Children: 2  . Years of Education: high schoo   Occupational History  . Retired    Social History Main Topics  . Smoking status: Never Smoker   . Smokeless tobacco: Never Used  . Alcohol Use: No  . Drug Use: No  . Sexual Activity: Not on file   Other Topics Concern  . Not  on file   Social History Narrative   Patient will be moving to [place].   Patient has 2 children.   Patient is retired   Patient has a high school education.   Patient is single.     PHYSICAL EXAM  Filed Vitals:   07/29/13 1417  BP: 132/73  Pulse: 79  Height: 5' (1.524 m)  Weight: 154 lb (69.854 kg)   Body mass index is 30.08 kg/(m^2).  Generalized: Well developed, in no acute distress  Head: normocephalic and atraumatic,. Oropharynx benign  Neck: Supple, no carotid bruits  Cardiac: Regular rate rhythm, no murmur   Neurological examination   Mentation: Alert oriented to time, place, history taking. Follows all commands speech and language fluent  Cranial nerve II-XII: Pupils were equal round reactive to light extraocular movements were full, visual field were full on confrontational test. Facial sensation and strength were normal. hearing was intact to finger rubbing bilaterally. Uvula tongue midline. head turning and shoulder shrug and were normal and symmetric.Tongue protrusion into cheek strength was normal. Motor: normal bulk and tone, full strength in the BUE, BLE, fine finger movements normal, no pronator drift.  Sensory: normal and symmetric to light touch, pinprick, and  vibration  Coordination: finger-nose-finger,  no dysmetria Reflexes: Brachioradialis 2/2, biceps 2/2, triceps 2/2, patellar 2/2, Achilles 2/2, plantar responses were flexor bilaterally. Gait and Station: Rising up from seated position without assistance, wide based  stance, ambulates with a walker, no difficulty with turns   DIAGNOSTIC DATA (LABS, IMAGING, TESTING) - I reviewed patient records, labs, notes, testing and imaging myself where available.  Lab Results  Component Value Date   WBC 13.0* 05/26/2013   HGB 12.7 05/26/2013   HCT 37.0 05/26/2013   MCV 88.5 05/26/2013   PLT 247 05/26/2013      Component Value Date/Time   NA 139 07/28/2013 0959   K 4.2 07/28/2013 0959   CL 101  07/28/2013 0959   CO2 30 07/28/2013 0959   GLUCOSE 106* 07/28/2013 0959   BUN 7 07/28/2013 0959   CREATININE 0.7 07/28/2013 0959   CALCIUM 9.3 07/28/2013 0959   PROT 7.3 07/28/2013 0959   ALBUMIN 3.7 07/28/2013 0959   AST 26 07/28/2013 0959   ALT 22 07/28/2013 0959   ALKPHOS 79 07/28/2013 0959   BILITOT 0.6 07/28/2013 0959   GFRNONAA 87* 05/26/2013 0910   GFRAA >90 05/26/2013 0910   Lab Results  Component Value Date   CHOL 154 07/28/2013   HDL 80.70 07/28/2013   LDLCALC 62 07/28/2013   LDLDIRECT 114.4 08/09/2010   TRIG 59.0 07/28/2013   CHOLHDL 2 07/28/2013   Lab Results  Component Value Date   HGBA1C 5.6 08/24/2012   Lab Results  Component Value Date   VITAMINB12 691 11/05/2012  Lab Results  Component Value Date   TSH 0.62 02/22/2013     ASSESSMENT AND PLAN  79 y.o. year old female  has a past medical history of Hypertension; Osteoporosis; CVA (cerebral vascular accident) (11/01/2009); Osteoarthritis; and Hyperlipidemia. here to followup.  Continue aspirin for secondary stroke prevention  Strict control of blood pressure with systolic below 130 LDL goal below 100 Please use walker at all times for safe ambulation Follow up yearly  Nilda Riggs, [facility], [facility], APRN  [facility] [address] [place], [place] [address] [phone]

## 2013-07-29 NOTE — Patient Instructions (Signed)
Continue aspirin for secondary stroke prevention  Strict control of blood pressure with systolic below 130 LDL goal below 100 Please use walker at all times Follow up yearly

## 2013-08-11 ENCOUNTER — Other Ambulatory Visit: Payer: Self-pay

## 2013-08-11 MED ORDER — SERTRALINE HCL 100 MG PO TABS: 150.0000 mg | ORAL_TABLET | Freq: Every day | ORAL | Status: DC

## 2013-08-11 NOTE — Telephone Encounter (Signed)
Patient has been on the 100 mg of Sertraline but it looks like when she was in the hospital they increased it to 150 mg. Please advise if refill is appropriate?

## 2013-08-18 ENCOUNTER — Telehealth: Payer: Self-pay | Admitting: Family Medicine

## 2013-08-18 ENCOUNTER — Telehealth: Payer: Self-pay | Admitting: *Deleted

## 2013-08-18 NOTE — Telephone Encounter (Signed)
Beth Reynolds from [facility] called and stated that the patient has not met her goal for physical therapy. Beth states that the patient sill having balance issues. She would benefit with the continuous of physical therapy. She was given a verbal order and advised her to fax over the orders so that the provider could do the appropriate documentation. SW

## 2013-08-18 NOTE — Telephone Encounter (Signed)
Patient's daughter, [name], is calling to discuss her mother possibly being weaned off of Zoloft. She states that it is causing her to be extremely fatigued. She thinks this is over-shadowing any benefit. Please advise.

## 2013-08-19 NOTE — Telephone Encounter (Signed)
Spoke with patient's daughter and she states that the Zoloft is making  her mother  exteremly fatigue. Daughter states that patient cannot function and believe that medication is not working. Daughter states that mother dosage was increased. Patient has moved to a retirement home community and that she can't walk down the street without getting really tired. Patient daughter believes she is not benefiting from this and would like to know if she could come off the medication. Advised patient that provider was out of the office and will notified her once she responses.

## 2013-08-19 NOTE — Telephone Encounter (Signed)
Decrease back to 100 mg--- if she improves we need to discuss changing meds

## 2013-08-19 NOTE — Telephone Encounter (Signed)
Agree with verbal orders  

## 2013-08-24 NOTE — Telephone Encounter (Signed)
If symptoms from increased dosage resolves but depression is worse we will need to change med

## 2013-08-24 NOTE — Telephone Encounter (Signed)
Patient's daughter is calling back in regards to question below. Read her message from Dr. Laury Axon below. She would like clarification on "if she improves we need to discuss changing meds." Please advise.

## 2013-09-07 ENCOUNTER — Other Ambulatory Visit: Payer: Self-pay | Admitting: Family Medicine

## 2013-10-11 ENCOUNTER — Telehealth: Payer: Self-pay

## 2013-10-11 NOTE — Telephone Encounter (Signed)
Gabriela Patel from [facility] came in and advised that [facility] [place] [address] and the patient's BP was 156/78 and her HR was 72. The patient was not in any distress snf Gabriela Patel just wanted to make you aware per [facility]'s protocol.       KP

## 2013-10-11 NOTE — Telephone Encounter (Signed)
Can pt come in for ov

## 2013-10-12 NOTE — Telephone Encounter (Signed)
Please offer this patient an apt for a follow up on BP.       KP

## 2013-10-22 ENCOUNTER — Telehealth: Payer: Self-pay

## 2013-10-22 ENCOUNTER — Encounter: Payer: Self-pay | Admitting: Family Medicine

## 2013-10-22 ENCOUNTER — Ambulatory Visit (INDEPENDENT_AMBULATORY_CARE_PROVIDER_SITE_OTHER): Payer: Medicare Other | Admitting: Family Medicine

## 2013-10-22 VITALS — BP 156/76 | HR 74 | Temp 98.5°F | Wt 126.0 lb

## 2013-10-22 DIAGNOSIS — F32A Depression, unspecified: Secondary | ICD-10-CM

## 2013-10-22 DIAGNOSIS — F3289 Other specified depressive episodes: Secondary | ICD-10-CM

## 2013-10-22 DIAGNOSIS — F329 Major depressive disorder, single episode, unspecified: Secondary | ICD-10-CM

## 2013-10-22 DIAGNOSIS — I10 Essential (primary) hypertension: Secondary | ICD-10-CM

## 2013-10-22 MED ORDER — LOSARTAN POTASSIUM-HCTZ 100-25 MG PO TABS: 1.0000 | ORAL_TABLET | Freq: Every day | ORAL | Status: DC

## 2013-10-22 MED ORDER — SERTRALINE HCL 100 MG PO TABS: ORAL_TABLET | ORAL | Status: DC

## 2013-10-22 MED ORDER — LOSARTAN POTASSIUM 25 MG PO TABS: 25.0000 mg | ORAL_TABLET | Freq: Every day | ORAL | Status: DC

## 2013-10-22 NOTE — Patient Instructions (Signed)

## 2013-10-22 NOTE — Progress Notes (Signed)
Pre visit review using our clinic review tool, if applicable. No additional management support is needed unless otherwise documented below in the visit note. 

## 2013-10-22 NOTE — Telephone Encounter (Signed)
Pharmacy and the patient has already picked up both medications. [name] (Pharmacist) advised the patient can go ahead and bring the medication back and they will be fine. I made Gabriela Patel aware and she had agreed to gp back and pick it up.        KP

## 2013-10-22 NOTE — Telephone Encounter (Signed)
Message copied by Arnette NorrisPAYNE, Kaysen Deal P on Fri Oct 22, 2013  4:48 PM ------      Message from: Lelon PerlaLOWNE, YVONNE R      Created: Fri Oct 22, 2013  3:35 PM       Please cancel losartan hct 100/25 ------

## 2013-10-23 ENCOUNTER — Telehealth: Payer: Self-pay | Admitting: Family Medicine

## 2013-10-23 NOTE — Telephone Encounter (Signed)
Relevant patient education assigned to patient using Emmi. ° °

## 2013-10-24 NOTE — Progress Notes (Signed)
  Subjective:    Patient here for follow-up of elevated blood pressure.  She is not exercising and is adherent to a low-salt diet.  Blood pressure is not well controlled at home. Cardiac symptoms: none. Patient denies: chest pain, chest pressure/discomfort, claudication, dyspnea, exertional chest pressure/discomfort, irregular heart beat, near-syncope, orthopnea, palpitations, paroxysmal nocturnal dyspnea, syncope and tachypnea. Cardiovascular risk factors: advanced age (older than 7055 for men, 2465 for women), hypertension and sedentary lifestyle. Use of agents associated with hypertension: none. History of target organ damage: stroke.  The following portions of the patient's history were reviewed and updated as appropriate: allergies, current medications, past family history, past medical history, past social history, past surgical history and problem list.  Review of Systems Pertinent items are noted in HPI.     Objective:    BP 156/76  Pulse 74  Temp(Src) 98.5 F (36.9 C) (Oral)  Wt 126 lb (57.153 kg)  SpO2 96% General appearance: alert, cooperative, appears stated age and no distress Neck: no adenopathy, supple, symmetrical, trachea midline and thyroid not enlarged, symmetric, no tenderness/mass/nodules Lungs: clear to auscultation bilaterally Heart: S1, S2 normal Extremities: extremities normal, atraumatic, no cyanosis or edema    Assessment:    Hypertension, stage 1 . Evidence of target organ damage: stroke.    Plan:    Medication: increasing losartan 25 mg qd . Dietary sodium restriction. Regular aerobic exercise. Follow up: 6 months and as needed. ----  Daughter will call with bp readings in 2-3 weeks or sooner prn

## 2013-10-28 ENCOUNTER — Telehealth: Payer: Self-pay | Admitting: Family Medicine

## 2013-10-28 DIAGNOSIS — I10 Essential (primary) hypertension: Secondary | ICD-10-CM

## 2013-10-28 NOTE — Telephone Encounter (Signed)
Please advise      KP 

## 2013-10-28 NOTE — Telephone Encounter (Signed)
Patient is calling with her recent blood pressure readings for Dr. Laury AxonLowne to determine if she needs to come in for an OV.  09/29/13-  156 over 78  10/14/13- 157 over 70  10/21/13- 126 over 64  10/28/13-180 over 78   Please advise.

## 2013-10-29 ENCOUNTER — Encounter: Payer: Self-pay | Admitting: Family Medicine

## 2013-10-29 MED ORDER — LOSARTAN POTASSIUM 25 MG PO TABS: 25.0000 mg | ORAL_TABLET | Freq: Two times a day (BID) | ORAL | Status: DC

## 2013-10-29 NOTE — Telephone Encounter (Signed)
discussed with Lupita LeashDonna and voiced understanding, Rx sent with new directions and apt scheduled.      KP

## 2013-10-29 NOTE — Telephone Encounter (Signed)
Losartan 25 mg bid ;F/U with Dr Laury AxonLowne in 7-10 days

## 2013-10-29 NOTE — Addendum Note (Signed)
Addended by: Arnette NorrisPAYNE, Asmara Backs P on: 10/29/2013 10:15 AM   Modules accepted: Orders

## 2013-11-05 ENCOUNTER — Encounter: Payer: Self-pay | Admitting: Family Medicine

## 2013-11-05 ENCOUNTER — Ambulatory Visit (INDEPENDENT_AMBULATORY_CARE_PROVIDER_SITE_OTHER): Payer: Medicare Other | Admitting: Family Medicine

## 2013-11-05 VITALS — BP 140/78 | HR 73 | Temp 99.1°F | Wt 126.0 lb

## 2013-11-05 DIAGNOSIS — I10 Essential (primary) hypertension: Secondary | ICD-10-CM

## 2013-11-05 DIAGNOSIS — F32A Depression, unspecified: Secondary | ICD-10-CM

## 2013-11-05 DIAGNOSIS — M810 Age-related osteoporosis without current pathological fracture: Secondary | ICD-10-CM

## 2013-11-05 DIAGNOSIS — F3289 Other specified depressive episodes: Secondary | ICD-10-CM

## 2013-11-05 DIAGNOSIS — F329 Major depressive disorder, single episode, unspecified: Secondary | ICD-10-CM

## 2013-11-05 MED ORDER — LOSARTAN POTASSIUM 25 MG PO TABS: 25.0000 mg | ORAL_TABLET | Freq: Two times a day (BID) | ORAL | Status: DC

## 2013-11-05 MED ORDER — SERTRALINE HCL 100 MG PO TABS: ORAL_TABLET | ORAL | Status: DC

## 2013-11-05 NOTE — Progress Notes (Signed)
  Subjective:    Patient here for follow-up of elevated blood pressure.  She is not exercising and is adherent to a low-salt diet.  Blood pressure is well controlled at home. Cardiac symptoms: none. Patient denies: chest pain, chest pressure/discomfort, claudication, dyspnea, exertional chest pressure/discomfort, irregular heart beat, lower extremity edema, near-syncope, orthopnea, palpitations, paroxysmal nocturnal dyspnea, syncope and tachypnea. Cardiovascular risk factors: advanced age (older than 5555 for men, 7965 for women), hypertension and sedentary lifestyle. Use of agents associated with hypertension: none. History of target organ damage: cva  The following portions of the patient's history were reviewed and updated as appropriate: allergies, current medications, past family history, past medical history, past social history, past surgical history and problem list.  Review of Systems Pertinent items are noted in HPI.     Objective:    BP 140/78  Pulse 73  Temp(Src) 99.1 F (37.3 C) (Oral)  Wt 126 lb (57.153 kg)  SpO2 97% General appearance: alert, cooperative, appears stated age and no distress Nose: Nares normal. Septum midline. Mucosa normal. No drainage or sinus tenderness. Throat: lips, mucosa, and tongue normal; teeth and gums normal Neck: no adenopathy, supple, symmetrical, trachea midline and thyroid not enlarged, symmetric, no tenderness/mass/nodules Lungs: clear to auscultation bilaterally Heart: S1, S2 normal Extremities: extremities normal, atraumatic, no cyanosis or edema    Assessment:    Hypertension, normal blood pressure . Evidence of target organ damage: stroke.    Plan:    Medication: no change. Dietary sodium restriction. Regular aerobic exercise. Follow up: 3 months and as needed.

## 2013-11-05 NOTE — Patient Instructions (Signed)

## 2013-11-05 NOTE — Progress Notes (Signed)
Pre-visit discussion using our clinic review tool. No additional management support is needed unless otherwise documented below in the visit note.  

## 2013-11-05 NOTE — Assessment & Plan Note (Signed)
Pt would like to consider prolia or reclast We will call pt when prior auth done Recheck 2 years

## 2013-12-28 ENCOUNTER — Telehealth: Payer: Self-pay

## 2013-12-28 NOTE — Telephone Encounter (Signed)
Prolia benefits have been verified. No PA is needed. Patient has a $147 deductible and she has met $139.82 and a 20% co-insurance for the administration and cost of Prolia. The patient she will have to pay 20% which is equivalent to $28.57 monthly for 6 mos. I discussed with the patient and she does not remember discussing the Prolia injection, she said he daughter may have requested for her to have it and she would like to wait until her appointment on 01/25/14 at 11 am with Dr.Lowne. I advised I would add the information to her appointment so we will not forget.  KP

## 2014-01-06 ENCOUNTER — Encounter: Payer: Self-pay | Admitting: Family Medicine

## 2014-01-25 ENCOUNTER — Encounter: Payer: Self-pay | Admitting: Family Medicine

## 2014-01-25 ENCOUNTER — Ambulatory Visit (INDEPENDENT_AMBULATORY_CARE_PROVIDER_SITE_OTHER): Payer: Medicare Other | Admitting: Family Medicine

## 2014-01-25 VITALS — BP 164/78 | HR 78 | Temp 98.8°F | Wt 134.0 lb

## 2014-01-25 DIAGNOSIS — I10 Essential (primary) hypertension: Secondary | ICD-10-CM

## 2014-01-25 DIAGNOSIS — F3289 Other specified depressive episodes: Secondary | ICD-10-CM

## 2014-01-25 DIAGNOSIS — F32A Depression, unspecified: Secondary | ICD-10-CM

## 2014-01-25 DIAGNOSIS — R269 Unspecified abnormalities of gait and mobility: Secondary | ICD-10-CM

## 2014-01-25 DIAGNOSIS — M810 Age-related osteoporosis without current pathological fracture: Secondary | ICD-10-CM

## 2014-01-25 DIAGNOSIS — F329 Major depressive disorder, single episode, unspecified: Secondary | ICD-10-CM

## 2014-01-25 DIAGNOSIS — N390 Urinary tract infection, site not specified: Secondary | ICD-10-CM

## 2014-01-25 DIAGNOSIS — R5381 Other malaise: Secondary | ICD-10-CM

## 2014-01-25 DIAGNOSIS — R5383 Other fatigue: Secondary | ICD-10-CM

## 2014-01-25 LAB — HEPATIC FUNCTION PANEL
ALK PHOS: 58 U/L (ref 39–117)
ALT: 19 U/L (ref 0–35)
AST: 23 U/L (ref 0–37)
Albumin: 3.8 g/dL (ref 3.5–5.2)
BILIRUBIN DIRECT: 0 mg/dL (ref 0.0–0.3)
BILIRUBIN TOTAL: 0.7 mg/dL (ref 0.3–1.2)
TOTAL PROTEIN: 7.3 g/dL (ref 6.0–8.3)

## 2014-01-25 LAB — BASIC METABOLIC PANEL
BUN: 9 mg/dL (ref 6–23)
CHLORIDE: 96 meq/L (ref 96–112)
CO2: 28 meq/L (ref 19–32)
CREATININE: 0.7 mg/dL (ref 0.4–1.2)
Calcium: 9.7 mg/dL (ref 8.4–10.5)
GFR: 90.47 mL/min (ref >60.00)
Glucose, Bld: 102 mg/dL — ABNORMAL HIGH (ref 70–99)
Potassium: 4.3 mEq/L (ref 3.5–5.1)
SODIUM: 132 meq/L — AB (ref 135–145)

## 2014-01-25 LAB — CBC WITH DIFFERENTIAL/PLATELET
BASOS ABS: 0 10*3/uL (ref 0.0–0.1)
Basophils Relative: 0.5 % (ref 0.0–3.0)
EOS PCT: 0.8 % (ref 0.0–5.0)
Eosinophils Absolute: 0.1 10*3/uL (ref 0.0–0.7)
HEMATOCRIT: 39.5 % (ref 36.0–46.0)
HEMOGLOBIN: 13 g/dL (ref 12.0–15.0)
LYMPHS PCT: 14.3 % (ref 12.0–46.0)
Lymphs Abs: 1.2 10*3/uL (ref 0.7–4.0)
MCHC: 32.9 g/dL (ref 30.0–36.0)
MCV: 93.7 fl (ref 78.0–100.0)
MONOS PCT: 6.2 % (ref 3.0–12.0)
Monocytes Absolute: 0.5 10*3/uL (ref 0.1–1.0)
Neutro Abs: 6.5 10*3/uL (ref 1.4–7.7)
Neutrophils Relative %: 78.2 % — ABNORMAL HIGH (ref 43.0–77.0)
PLATELETS: 223 10*3/uL (ref 150.0–400.0)
RBC: 4.21 Mil/uL (ref 3.87–5.11)
RDW: 15 % — AB (ref 11.5–14.6)
WBC: 8.4 10*3/uL (ref 4.5–10.5)

## 2014-01-25 LAB — LIPID PANEL
CHOL/HDL RATIO: 2
Cholesterol: 163 mg/dL (ref 0–200)
HDL: 85.8 mg/dL (ref >39.00)
LDL CALC: 62 mg/dL (ref 0–99)
TRIGLYCERIDES: 78 mg/dL (ref 0.0–149.0)
VLDL: 15.6 mg/dL (ref 0.0–40.0)

## 2014-01-25 LAB — VITAMIN B12

## 2014-01-25 MED ORDER — CITALOPRAM HYDROBROMIDE 10 MG PO TABS: 10.0000 mg | ORAL_TABLET | Freq: Every day | ORAL | Status: DC

## 2014-01-25 MED ORDER — LOSARTAN POTASSIUM 25 MG PO TABS: 25.0000 mg | ORAL_TABLET | Freq: Two times a day (BID) | ORAL | Status: DC

## 2014-01-25 NOTE — Progress Notes (Signed)
   Subjective:    Patient ID: Gabriela Patel, female    DOB: 1926-11-22, 80 y.o.   MRN: [id number]  HPI Pt here with daughter c/o fatigue and elevated bp.  No cp, headaches.    Review of Systems As above    Objective:   Physical Exam  BP 164/78  Pulse 78  Temp(Src) 98.8 F (37.1 C) (Oral)  Wt 134 lb (60.782 kg)  SpO2 96% General appearance: alert, cooperative, appears stated age and no distress Neck: no adenopathy, no carotid bruit, no JVD, supple, symmetrical, trachea midline and thyroid not enlarged, symmetric, no tenderness/mass/nodules Lungs: clear to auscultation bilaterally Heart: S1, S2 normal Extremities: extremities normal, atraumatic, no cyanosis or edema Neurologic: Alert and oriented X 3, normal strength and tone. Normal symmetric reflexes. Normal coordination and gait--walks with walker       Assessment & Plan:  1. Depression D/w daughter -- pt has been getting angry and being nasty towards family members - citalopram (CELEXA) 10 MG tablet; Take 1 tablet (10 mg total) by mouth daily.  Dispense: 30 tablet; Refill: 2 - CBC with Differential - POCT urinalysis dipstick - Vitamin B12  2. HTN (hypertension) Elevated today and home bp as well ---see home bp - Basic metabolic panel - Hepatic function panel - Lipid panel - losartan (COZAAR) 25 MG tablet; Take 1 tablet (25 mg total) by mouth 2 (two) times daily. 2 po qam and 1 po qpm  Dispense: 270 tablet; Refill: 3  3. Other malaise and fatigue Check labs - Vitamin B12     4. Osteoporosis, unspecified rto for prolia

## 2014-01-25 NOTE — Progress Notes (Signed)
Pre visit review using our clinic review tool, if applicable. No additional management support is needed unless otherwise documented below in the visit note. 

## 2014-01-25 NOTE — Patient Instructions (Signed)

## 2014-02-01 ENCOUNTER — Ambulatory Visit (INDEPENDENT_AMBULATORY_CARE_PROVIDER_SITE_OTHER): Payer: Medicare Other

## 2014-02-01 DIAGNOSIS — M810 Age-related osteoporosis without current pathological fracture: Secondary | ICD-10-CM

## 2014-02-01 MED ORDER — DENOSUMAB 60 MG/ML ~~LOC~~ SOLN
60.0000 mg | Freq: Once | SUBCUTANEOUS | Status: AC
  Administered 2014-02-01: 60 mg via SUBCUTANEOUS

## 2014-02-02 LAB — POCT URINALYSIS DIPSTICK
BILIRUBIN UA: NEGATIVE
Blood, UA: NEGATIVE
GLUCOSE UA: NEGATIVE
KETONES UA: NEGATIVE
NITRITE UA: NEGATIVE
Protein, UA: NEGATIVE
Spec Grav, UA: 1.01
Urobilinogen, UA: 0.2
pH, UA: 6.5

## 2014-02-02 NOTE — Addendum Note (Signed)
Addended by: Silvio PateHOMPSON, Glade Strausser D on: 02/02/2014 11:04 AM   Modules accepted: Orders

## 2014-02-04 LAB — URINE CULTURE

## 2014-02-25 ENCOUNTER — Other Ambulatory Visit: Payer: Self-pay | Admitting: Family Medicine

## 2014-03-21 ENCOUNTER — Other Ambulatory Visit: Payer: Self-pay

## 2014-03-21 DIAGNOSIS — F329 Major depressive disorder, single episode, unspecified: Secondary | ICD-10-CM

## 2014-03-21 DIAGNOSIS — F32A Depression, unspecified: Secondary | ICD-10-CM

## 2014-03-21 MED ORDER — CITALOPRAM HYDROBROMIDE 10 MG PO TABS: 10.0000 mg | ORAL_TABLET | Freq: Every day | ORAL | Status: DC

## 2014-04-24 ENCOUNTER — Encounter: Payer: Self-pay | Admitting: Family Medicine

## 2014-04-24 ENCOUNTER — Encounter: Payer: Self-pay | Admitting: Nurse Practitioner

## 2014-04-25 ENCOUNTER — Telehealth: Payer: Self-pay

## 2014-04-25 NOTE — Telephone Encounter (Signed)
MyChart message was received from daughter, Louie BunDonna Apple.  Message was as follows:   ----- Message -----  From: Renaldo Reelorothy B Comrie Sent: 04/24/2014 7:37 PM  To: [facility] Clinical Pool Subject: Non-Urgent Medical Question My mother, Gabriela Patel, is having extreme difficulty moving her left leg. Her follow-up with Dr. Laury AxonLowne is scheduled for October. I would like for her to be seen by Dr. Laury AxonLowne this week (July 21-24) if possible. I am questioning the possibility of another stroke, needed change in medication(s) or need for additional therapy. If Dr. Laury AxonLowne could see her this week, I would appreciate it.  Louie BunDonna Apple (daughter) [email or web address]  [phone] (c)  Spoke with Mrs. Lupita LeashDonna Apple who states that mother has been extremely fatigued for 3 days.  She has not been getting out of bed and has been having her food brought to her.  Today, this seems to be improving.  Ms. Modena Nunnerypple states that patient went down for breakfast and lunch today.   She also reports that patient has been having issues with moving her left leg x 3 days (Friday, Sat., and Sun).  She has been requiring assistance to ambulate.  Denies weakness of her left arm or facial drooping.  Mom is said to be alert, oriented, no slurred speech, or drooling.  However, Ms. Apple states that she's concerned about stroke activity due to previous stroke.  Therefore, she sent a similar mychart message to pt's neurologist as she sent to us.  An appointment was scheduled for Wednesday, April 27, 2014 at 10:30 am.  An hour ago before call, pt was scheduled an appointment with Dr. Laury AxonLowne on 05/03/14 @ 2pm.    Mrs. Apple states that she lives 3 hours away and will be going to see her mother tomorrow.  She was advised to have patient taken to hospital if her symptoms worsen or new symptoms develop.  She stated understanding and agreed.

## 2014-04-27 ENCOUNTER — Encounter: Payer: Self-pay | Admitting: Neurology

## 2014-04-27 ENCOUNTER — Ambulatory Visit (INDEPENDENT_AMBULATORY_CARE_PROVIDER_SITE_OTHER): Payer: Medicare Other | Admitting: Neurology

## 2014-04-27 ENCOUNTER — Encounter: Payer: Self-pay | Admitting: Family Medicine

## 2014-04-27 VITALS — BP 141/78 | HR 85 | Wt 133.0 lb

## 2014-04-27 DIAGNOSIS — F32A Depression, unspecified: Secondary | ICD-10-CM

## 2014-04-27 DIAGNOSIS — F329 Major depressive disorder, single episode, unspecified: Secondary | ICD-10-CM

## 2014-04-27 DIAGNOSIS — F3289 Other specified depressive episodes: Secondary | ICD-10-CM

## 2014-04-27 DIAGNOSIS — I6350 Cerebral infarction due to unspecified occlusion or stenosis of unspecified cerebral artery: Secondary | ICD-10-CM

## 2014-04-27 DIAGNOSIS — R269 Unspecified abnormalities of gait and mobility: Secondary | ICD-10-CM

## 2014-04-27 MED ORDER — CITALOPRAM HYDROBROMIDE 10 MG PO TABS: 20.0000 mg | ORAL_TABLET | Freq: Every day | ORAL | Status: DC

## 2014-04-27 NOTE — Progress Notes (Signed)
[facility]  PATIENT: Gabriela Patel DOB: 1927/08/21   REASON FOR VISIT: Followup history of stroke   HISTORY OF PRESENT ILLNESS: Ms. Gabriela Patel, 80 year old white female returns for followup she has a history of right internal capsule lacunar infarct in January of 2011 from small vessel disease with risk factors of hypertension and mild hyperlipidemia. She denies double vision, loss of vision, numbness, speech or swallowing problems, confusions, blackouts. She does however complain of left leg heaviness, and she does not feel motivated to do anything. This has continued since her stroke. She is taking aspirin with minimal bruising She also recently had a sacral fracture and spent 2 months in rehabilitation. [place]. She is no longer driving and is accompanied by her daughter today.    Update 04/27/14 : She returns for followup today urgently upon request from the family of her last visit with Sunday ShamsKevin Martin and April 2014. She had an episode a few weeks ago when she could not walk left leg felt quite heavy. She troubled moving around and felt she would fall urine was obtained. She has had some residual left leg weakness, heaviness and stiffness falling right internal capsule infarct in 2011. She also complained at prior visits about fatigue and tiredness. She feels like all the symptoms seem more pronounced in the day and she couldn't walk. Over the last couple of weeks she has gradually improved but feels she is still not back to her previous PICC line 2 weeks ago. She denied any accompanying symptoms in the form of headache, blurred vision, slurred speech, and weakness, numbness. Her blood pressure recently had been found to be elevated and primary physician had increased blood pressure medicines. Today it is 144/70. She had last lipid profile checked more than 6 months ago and I do not have the results. She has been started on Celexa 10 mg daily  for the last couple of months but does admit that she may still be depressed. She's not had any recent lab work or brain imaging studies done since this episode.  REVIEW OF SYSTEMS: Full 14 system review of systems performed and notable only for:  fatigue, walking difficulty, leg heaviness, weakness, depression and all the systems negative  ALLERGIES: No Known Allergies  HOME MEDICATIONS: Outpatient Prescriptions Prior to Visit  Medication Sig Dispense Refill  . aspirin 81 MG tablet Take 81 mg by mouth daily.        . Calcium Carbonate (CALTRATE 600 PO) Take by mouth.      Marland Kitchen. ibuprofen (ADVIL,MOTRIN) 200 MG tablet Take 400 mg by mouth every 6 (six) hours as needed. For pain      . losartan (COZAAR) 25 MG tablet Take 1 tablet (25 mg total) by mouth 2 (two) times daily. 2 po qam and 1 po qpm  270 tablet  3  . Multiple Vitamin (MULTIVITAMIN WITH MINERALS) TABS Take 1 tablet by mouth daily.      . pravastatin (PRAVACHOL) 20 MG tablet TAKE 1 TABLET (20 MG TOTAL) BY MOUTH DAILY.  90 tablet  1  . citalopram (CELEXA) 10 MG tablet Take 1 tablet (10 mg total) by mouth daily.  90 tablet  1   No facility-administered medications prior to visit.    PAST MEDICAL HISTORY: Past Medical History  Diagnosis Date  . Hypertension   . Osteoporosis   . CVA (cerebral vascular accident) 11/01/2009  . Osteoarthritis   . Hyperlipidemia  PAST SURGICAL HISTORY: Past Surgical History  Procedure Laterality Date  . Abdominal hysterectomy    . Cataract extraction      Left  . Vein ligation      Right Leg    FAMILY HISTORY: Family History  Problem Relation Age of Onset  . Coronary artery disease    . Hip fracture Mother     B/L  . Heart attack Father   . Heart disease Brother     SOCIAL HISTORY: History   Social History  . Marital Status: Single    Spouse Name: N/A    Number of Children: 2  . Years of Education: high schoo   Occupational History  . Retired    Social History Main Topics   . Smoking status: Never Smoker   . Smokeless tobacco: Never Used  . Alcohol Use: No  . Drug Use: No  . Sexual Activity: Not on file   Other Topics Concern  . Not on file   Social History Narrative   Patient will be moving to [place].   Patient has 2 children.   Patient is retired   Patient has a high school education.   Patient is single.     PHYSICAL EXAM  Filed Vitals:   04/27/14 1036  BP: 141/78  Pulse: 85  Weight: 60.328 kg (133 lb)   Body mass index is 25.97 kg/(m^2).  Generalized: Well developed, in no acute distress  Head: normocephalic and atraumatic,. Oropharynx benign  Neck: Supple, no carotid bruits  Cardiac: Regular rate rhythm, no murmur  Filed Vitals:   04/27/14 1036  BP: 141/78  Pulse: 85    Neurological examination   Mentation: Alert oriented to time, place, history taking. Follows all commands speech and language fluent  Cranial nerve II-XII: Pupils were equal round reactive to light extraocular movements were full, visual field were full on confrontational test. Facial sensation and strength were normal. hearing was intact to finger rubbing bilaterally. Uvula tongue midline. head turning and shoulder shrug and were normal and symmetric.Tongue protrusion into cheek strength was normal. Motor: normal bulk and tone, full strength in the BUE, BLE, fine finger movements normal, no pronator drift. Left lower eczema T4/5 weakness with spasticity and increased tone. Sensory: normal and symmetric to light touch, pinprick, and  vibration  Coordination: finger-nose-finger,  no dysmetria. Impaired need to heal ataxia on the left Reflexes: Brachioradialis 2/2, biceps 2/2, triceps 2/2, patellar 2/2, Achilles 2/2, plantar responses were flexor bilaterally. Gait and Station: Rising up from seated position without assistance, wide based  stance, ambulates with a cane, no difficulty with turns   DIAGNOSTIC DATA (LABS, IMAGING, TESTING) - I reviewed patient  records, labs, notes, testing and imaging myself where available.  Lab Results  Component Value Date   WBC 8.4 01/25/2014   HGB 13.0 01/25/2014   HCT 39.5 01/25/2014   MCV 93.7 01/25/2014   PLT 223.0 01/25/2014      Component Value Date/Time   NA 132* 01/25/2014 1212   K 4.3 01/25/2014 1212   CL 96 01/25/2014 1212   CO2 28 01/25/2014 1212   GLUCOSE 102* 01/25/2014 1212   BUN 9 01/25/2014 1212   CREATININE 0.7 01/25/2014 1212   CALCIUM 9.7 01/25/2014 1212   PROT 7.3 01/25/2014 1212   ALBUMIN 3.8 01/25/2014 1212   AST 23 01/25/2014 1212   ALT 19 01/25/2014 1212   ALKPHOS 58 01/25/2014 1212   BILITOT 0.7 01/25/2014 1212   GFRNONAA 87* 05/26/2013 0910  GFRAA >90 05/26/2013 0910   Lab Results  Component Value Date   CHOL 163 01/25/2014   HDL 85.80 01/25/2014   LDLCALC 62 01/25/2014   LDLDIRECT 114.4 08/09/2010   TRIG 78.0 01/25/2014   CHOLHDL 2 01/25/2014   Lab Results  Component Value Date   HGBA1C 5.6 08/24/2012   Lab Results  Component Value Date   VITAMINB12 >1500* 01/25/2014   Lab Results  Component Value Date   TSH 0.62 02/22/2013     ASSESSMENT AND PLAN  34 year lady with Remote right internal capsule infarct in January 2011 with residual left leg weakness and stiffness with new onset transient worsening few weeks ago possible new stroke versus worsening of old deficits. Underlying suboptimal treated anxiety/depression may be contributing.   I had a long discussion with the patient and her daughter regarding her recent lab episode of left leg weakness and difficulty walking. I discussed plan for evaluation, differential diagnosis, treatment and answered questions. Check complete metabolic panel labs, CBC, UA, fasting lipid profile hemoglobin A1c. Check carotid ultrasound, MRI scan of the brain and MRA of the brain. Refer to physical therapy for gait and balance training. I also recommend she increase the Celexa to 20 mg a day. Return for followup in 2 months with Sarita Bottom.  call earlier if necessary. Delia Heady, MD [facility] [address], [address] [place], [place] [address] [phone]

## 2014-04-27 NOTE — Patient Instructions (Signed)
I had a long discussion with the patient and her daughter regarding her recent lab episode of left leg weakness and difficulty walking. I discussed plan for evaluation, differential diagnosis, treatment and answered questions. Check complete metabolic panel labs, CBC, UA, fasting lipid profile hemoglobin A1c. Check carotid ultrasound, MRI scan of the brain and MRA of the brain. Refer to physical therapy for gait and balance training. I also recommend she increase the Celexa to 20 mg a day. Return for followup in 2 months with Gabriela Bottomarolyn Martin,NP. call earlier if necessary.

## 2014-05-03 ENCOUNTER — Ambulatory Visit: Payer: Medicare Other | Admitting: Family Medicine

## 2014-05-11 DIAGNOSIS — R269 Unspecified abnormalities of gait and mobility: Secondary | ICD-10-CM

## 2014-05-12 ENCOUNTER — Ambulatory Visit
Admission: RE | Admit: 2014-05-12 | Discharge: 2014-05-12 | Disposition: A | Discharge status: Home / Self Care | Payer: Medicare Other | Source: Ambulatory Visit | Attending: Neurology | Admitting: Neurology

## 2014-05-12 DIAGNOSIS — R269 Unspecified abnormalities of gait and mobility: Secondary | ICD-10-CM

## 2014-05-12 DIAGNOSIS — I6350 Cerebral infarction due to unspecified occlusion or stenosis of unspecified cerebral artery: Secondary | ICD-10-CM

## 2014-05-12 MED ORDER — GADOBENATE DIMEGLUMINE 529 MG/ML IV SOLN: 12.0000 mL | Freq: Once | INTRAVENOUS | Status: AC | PRN

## 2014-05-19 ENCOUNTER — Other Ambulatory Visit (INDEPENDENT_AMBULATORY_CARE_PROVIDER_SITE_OTHER): Payer: Self-pay

## 2014-05-19 ENCOUNTER — Ambulatory Visit (INDEPENDENT_AMBULATORY_CARE_PROVIDER_SITE_OTHER): Payer: Medicare Other

## 2014-05-19 DIAGNOSIS — Z0289 Encounter for other administrative examinations: Secondary | ICD-10-CM

## 2014-05-19 DIAGNOSIS — I6350 Cerebral infarction due to unspecified occlusion or stenosis of unspecified cerebral artery: Secondary | ICD-10-CM

## 2014-05-19 DIAGNOSIS — R269 Unspecified abnormalities of gait and mobility: Secondary | ICD-10-CM

## 2014-05-19 LAB — CBC WITH DIFFERENTIAL/PLATELET
Basophils Absolute: 0 10*3/uL (ref 0.0–0.2)
Basos: 0 %
EOS ABS: 0.1 10*3/uL (ref 0.0–0.4)
EOS: 1 %
HCT: 36.7 % (ref 34.0–46.6)
Hemoglobin: 12.8 g/dL (ref 11.1–15.9)
LYMPHS: 15 %
Lymphocytes Absolute: 1.6 10*3/uL (ref 0.7–3.1)
MCH: 31 pg (ref 26.6–33.0)
MCHC: 34.9 g/dL (ref 31.5–35.7)
MCV: 89 fL (ref 79–97)
MONOS ABS: 0.7 10*3/uL (ref 0.1–0.9)
Monocytes: 7 %
NEUTROS PCT: 77 %
Neutrophils Absolute: 7.9 10*3/uL — ABNORMAL HIGH (ref 1.4–7.0)
RBC: 4.13 x10E6/uL (ref 3.77–5.28)
RDW: 13 % (ref 12.3–15.4)
WBC: 10.2 10*3/uL (ref 3.4–10.8)

## 2014-05-19 LAB — COMPREHENSIVE METABOLIC PANEL
ALBUMIN: 4.4 g/dL (ref 3.5–4.7)
ALK PHOS: 51 IU/L (ref 39–117)
ALT: 16 IU/L (ref 0–32)
AST: 26 IU/L (ref 0–40)
Albumin/Globulin Ratio: 1.7 (ref 1.1–2.5)
BILIRUBIN TOTAL: 0.5 mg/dL (ref 0.0–1.2)
BUN / CREAT RATIO: 13 (ref 11–26)
BUN: 9 mg/dL (ref 8–27)
CHLORIDE: 100 mmol/L (ref 96–108)
CO2: 28 mmol/L (ref 18–29)
Calcium: 9.1 mg/dL (ref 8.7–10.3)
Creatinine, Ser: 0.71 mg/dL (ref 0.57–1.00)
GFR calc non Af Amer: 78 mL/min/{1.73_m2} (ref >59)
GFR, EST AFRICAN AMERICAN: 90 mL/min/{1.73_m2} (ref >59)
Globulin, Total: 2.6 g/dL (ref 1.5–4.5)
Glucose: 99 mg/dL (ref 65–99)
POTASSIUM: 4.3 mmol/L (ref 3.5–5.2)
SODIUM: 136 mmol/L (ref 134–144)
Total Protein: 7 g/dL (ref 6.0–8.5)

## 2014-05-19 LAB — UNABLE TO VOID

## 2014-05-20 ENCOUNTER — Other Ambulatory Visit: Payer: Self-pay | Admitting: *Deleted

## 2014-05-20 DIAGNOSIS — R269 Unspecified abnormalities of gait and mobility: Secondary | ICD-10-CM

## 2014-05-20 DIAGNOSIS — I6350 Cerebral infarction due to unspecified occlusion or stenosis of unspecified cerebral artery: Secondary | ICD-10-CM

## 2014-05-20 NOTE — Addendum Note (Signed)
Addended byHermenia Fiscal: Ndia Sampath on: 05/20/2014 11:10 AM   Modules accepted: Orders

## 2014-05-21 LAB — URINALYSIS, ROUTINE W REFLEX MICROSCOPIC
Bilirubin, UA: NEGATIVE
Glucose, UA: NEGATIVE
NITRITE UA: NEGATIVE
PH UA: 7.5 (ref 5.0–7.5)
PROTEIN UA: NEGATIVE
RBC UA: NEGATIVE
SPEC GRAV UA: 1.014 (ref 1.005–1.030)
Urobilinogen, Ur: 0.2 mg/dL (ref 0.0–1.9)

## 2014-05-21 LAB — MICROSCOPIC EXAMINATION

## 2014-05-27 NOTE — Progress Notes (Signed)
Quick Note:  I called and gave the results to pt. Per Dr. Pearlean BrownieSethi labs normal, as well as UA.(cloudy). She verbalized understanding. [place] ______

## 2014-06-06 DIAGNOSIS — I6350 Cerebral infarction due to unspecified occlusion or stenosis of unspecified cerebral artery: Secondary | ICD-10-CM

## 2014-06-16 ENCOUNTER — Telehealth: Payer: Self-pay | Admitting: *Deleted

## 2014-06-16 NOTE — Telephone Encounter (Signed)
I called pt and relayed the results of carotid US results (mild hardening of arteries but not major blockages), repeat in 12 months.    She verbalized understanding.

## 2014-06-20 ENCOUNTER — Encounter: Payer: Self-pay | Admitting: Neurology

## 2014-06-24 ENCOUNTER — Telehealth: Payer: Self-pay | Admitting: Neurology

## 2014-06-24 NOTE — Telephone Encounter (Signed)
Called gave [facility] gave  verbal orders per Dr. Pearlean Brownie to continue PT.Marland Kitchen He showed understanding.

## 2014-06-24 NOTE — Telephone Encounter (Signed)
Gabriela Patel, physical therapist from [facility] calling to state that patient is doing well with gait training but they are still working with her on her balance, requesting verbal order to continue therapy, please return call and advise.

## 2014-07-27 ENCOUNTER — Ambulatory Visit: Payer: Medicare Other | Admitting: Family Medicine

## 2014-07-28 ENCOUNTER — Other Ambulatory Visit: Payer: Self-pay | Admitting: Neurology

## 2014-08-01 ENCOUNTER — Encounter: Payer: Self-pay | Admitting: Adult Health

## 2014-08-01 ENCOUNTER — Ambulatory Visit: Payer: Medicare Other | Admitting: Nurse Practitioner

## 2014-08-01 ENCOUNTER — Ambulatory Visit: Payer: Medicare Other | Admitting: Family Medicine

## 2014-08-01 ENCOUNTER — Ambulatory Visit (INDEPENDENT_AMBULATORY_CARE_PROVIDER_SITE_OTHER): Payer: Medicare Other | Admitting: Adult Health

## 2014-08-01 VITALS — BP 147/86 | HR 79 | Ht 60.0 in | Wt 135.0 lb

## 2014-08-01 DIAGNOSIS — I639 Cerebral infarction, unspecified: Secondary | ICD-10-CM

## 2014-08-01 DIAGNOSIS — Z8673 Personal history of transient ischemic attack (TIA), and cerebral infarction without residual deficits: Secondary | ICD-10-CM | POA: Insufficient documentation

## 2014-08-01 DIAGNOSIS — F329 Major depressive disorder, single episode, unspecified: Secondary | ICD-10-CM

## 2014-08-01 DIAGNOSIS — F32A Depression, unspecified: Secondary | ICD-10-CM

## 2014-08-01 DIAGNOSIS — R269 Unspecified abnormalities of gait and mobility: Secondary | ICD-10-CM

## 2014-08-01 NOTE — Progress Notes (Signed)
PATIENT: Gabriela Patel DOB: July 27, 1927  REASON FOR VISIT: follow up HISTORY FROM: patient  HISTORY OF PRESENT ILLNESS: Gabriela Patel is an 80 year old female with a history of right internal capsul lacunar infarct in 2011 as well as depression. She returns today for follow-up. At the previous visit she was complaining of left leg heaviness and therefore was sent for PT. She reports that it was beneficial. She reports that she just stays tired most of the day.  If she gets up and goes to the bathroom she is tired from that. Denies SOB. She is also taking Celexa for depression. This was increased at the last visit and the patient nor her daughter feel that the Celexa has been beneficial. Patient states that she would like to have "just one day" that she feels like herself. She reports that she continues to do housework. She will do her laundry. [facility]. She does [facility] and will participate in activities. She will occasionally participate in exercise classes.   HISTORY 04/27/14 (SETHI): 80 year old white female returns for followup she has a history of right internal capsule lacunar infarct in January of 2011 from small vessel disease with risk factors of hypertension and mild hyperlipidemia. She denies double vision, loss of vision, numbness, speech or swallowing problems, confusions, blackouts. She does however complain of left leg heaviness, and she does not feel motivated to do anything. This has continued since her stroke. She is taking aspirin with minimal bruising She also recently had a sacral fracture and spent 2 months in rehabilitation. [place]. She is no longer driving and is accompanied by her daughter today. Update 04/27/14 : She returns for followup today urgently upon request from the family of her last visit with Sunday ShamsKevin Martin and April 2014. She had an episode a few weeks ago when she could not  walk left leg felt quite heavy. She troubled moving around and felt she would fall urine was obtained. She has had some residual left leg weakness, heaviness and stiffness falling right internal capsule infarct in 2011. She also complained at prior visits about fatigue and tiredness. She feels like all the symptoms seem more pronounced in the day and she couldn't walk. Over the last couple of weeks she has gradually improved but feels she is still not back to her previous PICC line 2 weeks ago. She denied any accompanying symptoms in the form of headache, blurred vision, slurred speech, and weakness, numbness. Her blood pressure recently had been found to be elevated and primary physician had increased blood pressure medicines. Today it is 144/70. She had last lipid profile checked more than 6 months ago and I do not have the results. She has been started on Celexa 10 mg daily for the last couple of months but does admit that she may still be depressed. She's not had any recent lab work or brain imaging studies done since this episode.    REVIEW OF SYSTEMS: Full 14 system review of systems performed and notable only for:  Constitutional: N/A  Eyes: N/A Ear/Nose/Throat: N/A  Skin: N/A  Cardiovascular: N/A  Respiratory: N/A  Gastrointestinal: N/A  Genitourinary: N/A Hematology/Lymphatic: N/A  Endocrine: N/A Musculoskeletal:N/A  Allergy/Immunology: N/A  Neurological: N/A Psychiatric: N/A Sleep: N/A   ALLERGIES: No Known Allergies  HOME MEDICATIONS: Outpatient Prescriptions Prior to Visit  Medication Sig Dispense Refill  . aspirin 81 MG tablet  Take 81 mg by mouth daily.        . Calcium Carbonate (CALTRATE 600 PO) Take by mouth.      . citalopram (CELEXA) 10 MG tablet TAKE 2 TABLETS (20 MG TOTAL) BY MOUTH DAILY.  180 tablet  0  . ibuprofen (ADVIL,MOTRIN) 200 MG tablet Take 400 mg by mouth every 6 (six) hours as needed. For pain      . losartan (COZAAR) 25 MG tablet Take 1 tablet (25 mg  total) by mouth 2 (two) times daily. 2 po qam and 1 po qpm  270 tablet  3  . Multiple Vitamin (MULTIVITAMIN WITH MINERALS) TABS Take 1 tablet by mouth daily.      . pravastatin (PRAVACHOL) 20 MG tablet TAKE 1 TABLET (20 MG TOTAL) BY MOUTH DAILY.  90 tablet  1   No facility-administered medications prior to visit.    PAST MEDICAL HISTORY: Past Medical History  Diagnosis Date  . Hypertension   . Osteoporosis   . CVA (cerebral vascular accident) 11/01/2009  . Osteoarthritis   . Hyperlipidemia     PAST SURGICAL HISTORY: Past Surgical History  Procedure Laterality Date  . Abdominal hysterectomy    . Cataract extraction      Left  . Vein ligation      Right Leg    FAMILY HISTORY: Family History  Problem Relation Age of Onset  . Coronary artery disease    . Hip fracture Mother     B/L  . Heart attack Father   . Heart disease Brother     SOCIAL HISTORY: History   Social History  . Marital Status: Single    Spouse Name: N/A    Number of Children: 2  . Years of Education: high schoo   Occupational History  . Retired    Social History Main Topics  . Smoking status: Never Smoker   . Smokeless tobacco: Never Used  . Alcohol Use: No  . Drug Use: No  . Sexual Activity: Not on file   Other Topics Concern  . Not on file   Social History Narrative   Patient will be moving to [place].   Patient has 2 children.   Patient is retired   Patient has a high school education.   Patient is single.      PHYSICAL EXAM  Filed Vitals:   08/01/14 1047  BP: 147/86  Pulse: 79  Height: 5' (1.524 m)  Weight: 135 lb (61.236 kg)   Body mass index is 26.37 kg/(m^2).  Generalized: Well developed, in no acute distress   Neurological examination  Mentation: Alert oriented to time, place, history taking. Follows all commands speech and language fluent Cranial nerve II-XII: Pupils were equal round reactive to light. Extraocular movements were full, visual field were  full on confrontational test. Facial sensation and strength were normal. Uvula tongue midline. Head turning and shoulder shrug  were normal and symmetric. Motor: The motor testing reveals 5 over 5 strength of all 4 extremities. Good symmetric motor tone is noted throughout.  Sensory: Sensory testing is intact to soft touch on all 4 extremities. No evidence of extinction is noted.  Coordination: Cerebellar testing reveals good finger-nose-finger and heel-to-shin bilaterally.  Gait and station: Patient is unable to stand from a sitting position. Uses a walker when ambulating. Patient tends to slide the right leg when walking creating a mild limp. Gait is slow and cautious. Tandem gait not attempted. Romberg is negative. No drift is seen.  Reflexes:  Deep tendon reflexes are symmetric and normal bilaterally.    DIAGNOSTIC DATA (LABS, IMAGING, TESTING) - I reviewed patient records, labs, notes, testing and imaging myself where available.  Lab Results  Component Value Date   WBC 10.2 05/19/2014   HGB 12.8 05/19/2014   HCT 36.7 05/19/2014   MCV 89 05/19/2014   PLT 223.0 01/25/2014      Component Value Date/Time   NA 136 05/19/2014 1506   NA 132* 01/25/2014 1212   K 4.3 05/19/2014 1506   CL 100 05/19/2014 1506   CO2 28 05/19/2014 1506   GLUCOSE 99 05/19/2014 1506   GLUCOSE 102* 01/25/2014 1212   BUN 9 05/19/2014 1506   BUN 9 01/25/2014 1212   CREATININE 0.71 05/19/2014 1506   CALCIUM 9.1 05/19/2014 1506   PROT 7.0 05/19/2014 1506   PROT 7.3 01/25/2014 1212   ALBUMIN 3.8 01/25/2014 1212   AST 26 05/19/2014 1506   ALT 16 05/19/2014 1506   ALKPHOS 51 05/19/2014 1506   BILITOT 0.5 05/19/2014 1506   GFRNONAA 78 05/19/2014 1506   GFRAA 90 05/19/2014 1506   Lab Results  Component Value Date   CHOL 163 01/25/2014   HDL 85.80 01/25/2014   LDLCALC 62 01/25/2014   LDLDIRECT 114.4 08/09/2010   TRIG 78.0 01/25/2014   CHOLHDL 2 01/25/2014   Lab Results  Component Value Date   HGBA1C 5.6 08/24/2012   Lab Results    Component Value Date   VITAMINB12 >1500* 01/25/2014   Lab Results  Component Value Date   TSH 0.62 02/22/2013      ASSESSMENT AND PLAN 80 y.o. year old female  has a past medical history of Hypertension; Osteoporosis; CVA (cerebral vascular accident) (11/01/2009); Osteoarthritis; and Hyperlipidemia. here with:  1. Hx of CVA 2. Abnormality of gait 3. Depression  Patient continues to take aspirin daily. She does not feel that the Celexa has been beneficial. At this time she does not have a PCP. [place] [facility]. They would like to be referred to Dr. Dava NajjarStonking since he is a geriatric doctor. If he has no availability they would like to see Dr. Bufford Spikesiffany Reed. I will put the referral in. The patient would like to stay on Celexa for now until she can establish a PCP. I have encouraged the patient to try to participate in the exercise classes at [facility]. She is amendable to this. If her symptoms worsen or she develops new symptoms she will let me know. Otherwise she will follow-up in 6 months or sooner if needed.   Butch PennyMegan Wayman Hoard, MSN, NP-C 08/01/2014, 10:51 AM [facility] [address], [address] [place], [place] [address] [phone]  Note: This document was prepared with digital dictation and possible smart phrase technology. Any transcriptional errors that result from this process are unintentional.

## 2014-08-01 NOTE — Patient Instructions (Signed)
Stroke Prevention Some medical conditions and behaviors are associated with an increased chance of having a stroke. You may prevent a stroke by making healthy choices and managing medical conditions. HOW CAN I REDUCE MY RISK OF HAVING A STROKE?   Stay physically active. Get at least 30 minutes of activity on most or all days.  Do not smoke. It may also be helpful to avoid exposure to secondhand smoke.  Limit alcohol use. Moderate alcohol use is considered to be:  No more than 2 drinks per day for men.  No more than 1 drink per day for nonpregnant women.  Eat healthy foods. This involves:  Eating 5 or more servings of fruits and vegetables a day.  Making dietary changes that address high blood pressure (hypertension), high cholesterol, diabetes, or obesity.  Manage your cholesterol levels.  Making food choices that are high in fiber and low in saturated fat, trans fat, and cholesterol may control cholesterol levels.  Take any prescribed medicines to control cholesterol as directed by your health care provider.  Manage your diabetes.  Controlling your carbohydrate and sugar intake is recommended to manage diabetes.  Take any prescribed medicines to control diabetes as directed by your health care provider.  Control your hypertension.  Making food choices that are low in salt (sodium), saturated fat, trans fat, and cholesterol is recommended to manage hypertension.  Take any prescribed medicines to control hypertension as directed by your health care provider.  Maintain a healthy weight.  Reducing calorie intake and making food choices that are low in sodium, saturated fat, trans fat, and cholesterol are recommended to manage weight.  Stop drug abuse.  Avoid taking birth control pills.  Talk to your health care provider about the risks of taking birth control pills if you are over 35 years old, smoke, get migraines, or have ever had a blood clot.  Get evaluated for sleep  disorders (sleep apnea).  Talk to your health care provider about getting a sleep evaluation if you snore a lot or have excessive sleepiness.  Take medicines only as directed by your health care provider.  For some people, aspirin or blood thinners (anticoagulants) are helpful in reducing the risk of forming abnormal blood clots that can lead to stroke. If you have the irregular heart rhythm of atrial fibrillation, you should be on a blood thinner unless there is a good reason you cannot take them.  Understand all your medicine instructions.  Make sure that other conditions (such as anemia or atherosclerosis) are addressed. SEEK IMMEDIATE MEDICAL CARE IF:   You have sudden weakness or numbness of the face, arm, or leg, especially on one side of the body.  Your face or eyelid droops to one side.  You have sudden confusion.  You have trouble speaking (aphasia) or understanding.  You have sudden trouble seeing in one or both eyes.  You have sudden trouble walking.  You have dizziness.  You have a loss of balance or coordination.  You have a sudden, severe headache with no known cause.  You have new chest pain or an irregular heartbeat. Any of these symptoms may represent a serious problem that is an emergency. Do not wait to see if the symptoms will go away. Get medical help at once. Call your local emergency services (911 in [place] Do not drive yourself to the hospital. Document Released: 10/31/2004 Document Revised: 02/07/2014 Document Reviewed: 03/26/2013 ExitCare Patient Information 2015 [employer]. This information is not intended to replace advice given   to you by your health care provider. Make sure you discuss any questions you have with your health care provider.  

## 2014-08-02 NOTE — Progress Notes (Signed)
I agree with the above plan 

## 2014-08-09 ENCOUNTER — Encounter: Payer: Self-pay | Admitting: Family Medicine

## 2014-08-12 ENCOUNTER — Encounter: Payer: Self-pay | Admitting: Family Medicine

## 2014-08-12 ENCOUNTER — Ambulatory Visit (INDEPENDENT_AMBULATORY_CARE_PROVIDER_SITE_OTHER): Payer: Medicare Other | Admitting: Family Medicine

## 2014-08-12 VITALS — BP 133/70 | HR 78 | Temp 97.9°F | Wt 131.0 lb

## 2014-08-12 DIAGNOSIS — I639 Cerebral infarction, unspecified: Secondary | ICD-10-CM

## 2014-08-12 DIAGNOSIS — M810 Age-related osteoporosis without current pathological fracture: Secondary | ICD-10-CM

## 2014-08-12 DIAGNOSIS — Z23 Encounter for immunization: Secondary | ICD-10-CM

## 2014-08-12 DIAGNOSIS — I10 Essential (primary) hypertension: Secondary | ICD-10-CM

## 2014-08-12 DIAGNOSIS — M1990 Unspecified osteoarthritis, unspecified site: Secondary | ICD-10-CM

## 2014-08-12 DIAGNOSIS — E785 Hyperlipidemia, unspecified: Secondary | ICD-10-CM

## 2014-08-12 LAB — HEPATIC FUNCTION PANEL
ALT: 16 U/L (ref 0–35)
AST: 21 U/L (ref 0–37)
Albumin: 3.5 g/dL (ref 3.5–5.2)
Alkaline Phosphatase: 47 U/L (ref 39–117)
BILIRUBIN TOTAL: 0.4 mg/dL (ref 0.2–1.2)
Bilirubin, Direct: 0 mg/dL (ref 0.0–0.3)
Total Protein: 7.6 g/dL (ref 6.0–8.3)

## 2014-08-12 LAB — LIPID PANEL
CHOLESTEROL: 174 mg/dL (ref 0–200)
HDL: 85.2 mg/dL (ref >39.00)
LDL Cholesterol: 77 mg/dL (ref 0–99)
NonHDL: 88.8
TRIGLYCERIDES: 60 mg/dL (ref 0.0–149.0)
Total CHOL/HDL Ratio: 2
VLDL: 12 mg/dL (ref 0.0–40.0)

## 2014-08-12 LAB — BASIC METABOLIC PANEL
BUN: 10 mg/dL (ref 6–23)
CHLORIDE: 101 meq/L (ref 96–112)
CO2: 24 mEq/L (ref 19–32)
CREATININE: 0.8 mg/dL (ref 0.4–1.2)
Calcium: 9.3 mg/dL (ref 8.4–10.5)
GFR: 73.42 mL/min (ref >60.00)
GLUCOSE: 102 mg/dL — AB (ref 70–99)
Potassium: 4.4 mEq/L (ref 3.5–5.1)
Sodium: 136 mEq/L (ref 135–145)

## 2014-08-12 MED ORDER — DENOSUMAB 60 MG/ML ~~LOC~~ SOLN
60.0000 mg | Freq: Once | SUBCUTANEOUS | Status: AC
  Administered 2014-08-12: 60 mg via SUBCUTANEOUS

## 2014-08-12 NOTE — Progress Notes (Signed)
  Subjective:    Patient here for follow-up of elevated blood pressure.  She is exercising and is adherent to a low-salt diet.  Blood pressure is well controlled at home--  122/74-156/82. Cardiac symptoms: none. Patient denies: chest pain, chest pressure/discomfort, claudication, dyspnea, exertional chest pressure/discomfort, fatigue, irregular heart beat, lower extremity edema, near-syncope, orthopnea, palpitations, paroxysmal nocturnal dyspnea, syncope and tachypnea. Cardiovascular risk factors: advanced age (older than 9655 for men, 1865 for women), dyslipidemia and hypertension. Use of agents associated with hypertension: none. History of target organ damage: none.  The following portions of the patient's history were reviewed and updated as appropriate: allergies, current medications, past family history, past medical history, past social history, past surgical history and problem list.  Review of Systems Pertinent items are noted in HPI.     Objective:    BP 133/70 mmHg  Pulse 78  Temp(Src) 97.9 F (36.6 C)  Wt 131 lb (59.421 kg)  SpO2 95% General appearance: alert, cooperative, appears stated age and no distress Throat: lips, mucosa, and tongue normal; teeth and gums normal Neck: no adenopathy, no carotid bruit, no JVD, supple, symmetrical, trachea midline and thyroid not enlarged, symmetric, no tenderness/mass/nodules Lungs: clear to auscultation bilaterally Heart: S1, S2 normal Extremities: extremities normal, atraumatic, no cyanosis or edema    Assessment:    Hypertension, normal blood pressure . Evidence of target organ damage: stroke.    Plan:    Medication: no change. Dietary sodium restriction. Regular aerobic exercise. Follow up: 6 months and as needed.    1. Essential hypertension Stable, con't meds - Basic metabolic panel - POCT urinalysis dipstick  2. Hyperlipidemia Check labs - Hepatic function panel - Lipid panel - POCT urinalysis dipstick  3. Need for  prophylactic vaccination against Streptococcus pneumoniae (pneumococcus)   - Pneumococcal conjugate vaccine 13-valent  4 osteoporosis - denosumab (PROLIA) injection 60 mg; Inject 60 mg into the skin once.  5, osteoarthritis--- tylenol arthritis

## 2014-08-12 NOTE — Patient Instructions (Signed)
Osteoporosis Throughout your life, your body breaks down old bone and replaces it with new bone. As you get older, your body does not replace bone as quickly as it breaks it down. By the age of 30 years, most people begin to gradually lose bone because of the imbalance between bone loss and replacement. Some people lose more bone than others. Bone loss beyond a specified normal degree is considered osteoporosis.  Osteoporosis affects the strength and durability of your bones. The inside of the ends of your bones and your flat bones, like the bones of your pelvis, look like honeycomb, filled with tiny open spaces. As bone loss occurs, your bones become less dense. This means that the open spaces inside your bones become bigger and the walls between these spaces become thinner. This makes your bones weaker. Bones of a person with osteoporosis can become so weak that they can break (fracture) during minor accidents, such as a simple fall. CAUSES  The following factors have been associated with the development of osteoporosis:  Smoking.  Drinking more than 2 alcoholic drinks several days per week.  Long-term use of certain medicines:  Corticosteroids.  Chemotherapy medicines.  Thyroid medicines.  Antiepileptic medicines.  Gonadal hormone suppression medicine.  Immunosuppression medicine.  Being underweight.  Lack of physical activity.  Lack of exposure to the sun. This can lead to vitamin D deficiency.  Certain medical conditions:  Certain inflammatory bowel diseases, such as Crohn disease and ulcerative colitis.  Diabetes.  Hyperthyroidism.  Hyperparathyroidism. RISK FACTORS Anyone can develop osteoporosis. However, the following factors can increase your risk of developing osteoporosis:  Gender--Women are at higher risk than men.  Age--Being older than 50 years increases your risk.  Ethnicity--White and Asian people have an increased risk.  Weight --Being extremely  underweight can increase your risk of osteoporosis.  Family history of osteoporosis--Having a family member who has developed osteoporosis can increase your risk. SYMPTOMS  Usually, people with osteoporosis have no symptoms.  DIAGNOSIS  Signs during a physical exam that may prompt your caregiver to suspect osteoporosis include:  Decreased height. This is usually caused by the compression of the bones that form your spine (vertebrae) because they have weakened and become fractured.  A curving or rounding of the upper back (kyphosis). To confirm signs of osteoporosis, your caregiver may request a procedure that uses 2 low-dose X-ray beams with different levels of energy to measure your bone mineral density (dual-energy X-ray absorptiometry [DXA]). Also, your caregiver may check your level of vitamin D. TREATMENT  The goal of osteoporosis treatment is to strengthen bones in order to decrease the risk of bone fractures. There are different types of medicines available to help achieve this goal. Some of these medicines work by slowing the processes of bone loss. Some medicines work by increasing bone density. Treatment also involves making sure that your levels of calcium and vitamin D are adequate. PREVENTION  There are things you can do to help prevent osteoporosis. Adequate intake of calcium and vitamin D can help you achieve optimal bone mineral density. Regular exercise can also help, especially resistance and weight-bearing activities. If you smoke, quitting smoking is an important part of osteoporosis prevention. MAKE SURE YOU:  Understand these instructions.  Will watch your condition.  Will get help right away if you are not doing well or get worse. FOR MORE INFORMATION www.osteo.org and www.nof.org Document Released: 07/03/2005 Document Revised: 01/18/2013 Document Reviewed: 09/07/2011 ExitCare Patient Information 2015 [employer]Care, [employer]. This information is not   intended to replace advice  given to you by your health care provider. Make sure you discuss any questions you have with your health care provider.  

## 2014-08-12 NOTE — Progress Notes (Signed)
Pre visit review using our clinic review tool, if applicable. No additional management support is needed unless otherwise documented below in the visit note. 

## 2014-08-15 LAB — POCT URINALYSIS DIPSTICK
BILIRUBIN UA: NEGATIVE
Blood, UA: NEGATIVE
GLUCOSE UA: NEGATIVE
KETONES UA: NEGATIVE
Nitrite, UA: NEGATIVE
PROTEIN UA: NEGATIVE
SPEC GRAV UA: 1.015
Urobilinogen, UA: 2
pH, UA: 7

## 2014-08-22 ENCOUNTER — Encounter: Payer: Self-pay | Admitting: Adult Health

## 2014-08-22 ENCOUNTER — Telehealth: Payer: Self-pay

## 2014-08-22 NOTE — Telephone Encounter (Signed)
I referred this patient to Dr. Pete GlatterStoneking for a PCP and if he is no longer accepting patients then Dr. Bufford Spikesiffany Reed. The patient has not received a call regarding this. Can we look into this referral.

## 2014-08-22 NOTE — Telephone Encounter (Signed)
Once you hear back from [name] for Dr. Laverle HobbyStoneking's office please call the patient with the appt. Time. If we do not hear from Gabriela Patel today please call the patient's daughter and update them on what is going on. Thanks!

## 2014-08-22 NOTE — Telephone Encounter (Signed)
Spoke to the operator @ Dr. Laverle HobbyStoneking's office. She explained the office did receive the referral but the patient has not been scheduled as of yet. She transferred me to Dr. Laverle HobbyStoneking's assistant to schedule patient because he only will see one new Medicare patient daily. Left a vmail for [name].

## 2014-08-22 NOTE — Telephone Encounter (Signed)
Spoke to daughter Ms. Apple. Advised of previous notes. Will call Dr. Ihor DowStonekingg's office first thing in the morning. Daughter appreciated the call and agreed.

## 2014-08-23 NOTE — Telephone Encounter (Signed)
Spoke to Hormel FoodsDora, Software engineerpatient coordinator, @ [facility]. Appt is scheduled for 10/03/14 @ 130pm. Faxed OV notes.  Verlee MonteDora will mail new patient paperwork to patient's address on file. Spoke to daughter Louie BunDonna Apple. Gave information.

## 2014-08-26 ENCOUNTER — Other Ambulatory Visit: Payer: Self-pay | Admitting: Family Medicine

## 2014-11-23 ENCOUNTER — Other Ambulatory Visit: Payer: Self-pay | Admitting: Family Medicine

## 2014-11-23 NOTE — Telephone Encounter (Signed)
Please verify the direction on the Losartan. The Rx has 2 directions.      KP

## 2015-01-31 ENCOUNTER — Encounter: Payer: Self-pay | Admitting: Nurse Practitioner

## 2015-01-31 ENCOUNTER — Ambulatory Visit (INDEPENDENT_AMBULATORY_CARE_PROVIDER_SITE_OTHER): Payer: Medicare Other | Admitting: Nurse Practitioner

## 2015-01-31 VITALS — BP 129/78 | HR 81 | Ht 60.0 in | Wt 126.6 lb

## 2015-01-31 DIAGNOSIS — Z8673 Personal history of transient ischemic attack (TIA), and cerebral infarction without residual deficits: Secondary | ICD-10-CM | POA: Diagnosis not present

## 2015-01-31 NOTE — Progress Notes (Signed)
[facility]  PATIENT: Gabriela Patel DOB: 22-Feb-1927   REASON FOR VISIT: Follow-up for history of stroke, depression and anxiety HISTORY FROM: Patient and daughter    HISTORY OF PRESENT ILLNESS:Gabriela Patel is an 81 year old female with a history of right internal capsul lacunar infarct in 2011 as well as depression. She returns today for follow-up. She was last seen by Gabriela Penny, NP 08/01/2014. She has not had further stroke or TIA symptoms. She is currently getting physical therapy after being treated for urinary tract infection with generalized weakness. She has also been taking Celexa for depression and that dose was recently increased by Dr. Pete Glatter however the daughter states that she can still be irritable at times. She is not certain that the Celexa has been beneficial. The patient now has someone 5 days a week to assist her with her bath, housework and laundry. She lives at [facility]. She does have her friends group at [facility] and will participate in activities. She clai she walks to the dining room for most meals. She returns for reevaluation    HISTORY 04/27/14 (SETHI): 81 year old white female returns for followup she has a history of right internal capsule lacunar infarct in January of 2011 from small vessel disease with risk factors of hypertension and mild hyperlipidemia. She denies double vision, loss of vision, numbness, speech or swallowing problems, confusions, blackouts. She does however complain of left leg heaviness, and she does not feel motivated to do anything. This has continued since her stroke. She is taking aspirin with minimal bruising She also recently had a sacral fracture and spent 2 months in rehabilitation. She is going to [place] in a few weeks. She is no longer driving and is accompanied by her daughter today. Update 04/27/14 : She returns for followup today urgently upon request from the family of her last visit  with Sunday Shams and April 2014. She had an episode a few weeks ago when she could not walk left leg felt quite heavy. She troubled moving around and felt she would fall urine was obtained. She has had some residual left leg weakness, heaviness and stiffness falling right internal capsule infarct in 2011. She also complained at prior visits about fatigue and tiredness. She feels like all the symptoms seem more pronounced in the day and she couldn't walk. Over the last couple of weeks she has gradually improved but feels she is still not back to her previous PICC line 2 weeks ago. She denied any accompanying symptoms in the form of headache, blurred vision, slurred speech, and weakness, numbness. Her blood pressure recently had been found to be elevated and primary physician had increased blood pressure medicines. Today it is 144/70. She had last lipid profile checked more than 6 months ago and I do not have the results. She has been started on Celexa 10 mg daily for the last couple of months but does admit that she may still be depressed. She's not had any recent lab work or brain imaging studies done since this episode.     REVIEW OF SYSTEMS: Full 14 system review of systems performed and notable only for those listed, all others are neg:  Constitutional:  Fatigue Cardiovascular : neg Ear/Nose/Throat: neg  Skin: neg Eyes: neg Respiratory: neg Gastroitestinal: continence of bladder, constipation Hematology /Lymphatic: neg  Endocrine: neg Musculoskeletal:nAching muscles walking difficulty Allergy /Immunology: neg Neurological:nWeakness Psychiatric :Depression and anxiety Sleep : neg   ALLERGIES: No Known Allergies  HOME MEDICATIONS:  Outpatient Prescriptions Prior to Visit  Medication Sig Dispense Refill  . aspirin 81 MG tablet Take 81 mg by mouth daily.      . Calcium Carbonate (CALTRATE 600 PO) Take by mouth 2 (two) times daily. Caltrate D3    . citalopram (CELEXA) 10 MG tablet TAKE 2  TABLETS (20 MG TOTAL) BY MOUTH DAILY. (Patient taking differently: 30mg  daily) 180 tablet 0  . ibuprofen (ADVIL,MOTRIN) 200 MG tablet Take 400 mg by mouth every 6 (six) hours as needed. For pain    . losartan (COZAAR) 25 MG tablet TAKE 1 TABLET TWICE A DAY 180 tablet 3  . Multiple Vitamin (MULTIVITAMIN WITH MINERALS) TABS Take 1 tablet by mouth daily.    . pravastatin (PRAVACHOL) 20 MG tablet TAKE 1 TABLET BY MOUTH EVERY DAY 90 tablet 1   No facility-administered medications prior to visit.    PAST MEDICAL HISTORY: Past Medical History  Diagnosis Date  . Hypertension   . Osteoporosis   . CVA (cerebral vascular accident) 11/01/2009  . Osteoarthritis   . Hyperlipidemia   . Fatigue     related to statin?  . Bladder incontinence     PAST SURGICAL HISTORY: Past Surgical History  Procedure Laterality Date  . Abdominal hysterectomy    . Cataract extraction      Left  . Vein ligation      Right Leg    FAMILY HISTORY: Family History  Problem Relation Age of Onset  . Coronary artery disease    . Hip fracture Mother     B/L  . Heart attack Father   . Heart disease Brother     SOCIAL HISTORY: History   Social History  . Marital Status: Single    Spouse Name: N/A  . Number of Children: 2  . Years of Education: high schoo   Occupational History  . Retired    Social History Main Topics  . Smoking status: Never Smoker   . Smokeless tobacco: Never Used  . Alcohol Use: No  . Drug Use: No  . Sexual Activity: Not on file   Other Topics Concern  . Not on file   Social History Narrative   [place].   Patient has 2 children.   Patient is retired   Patient has a high school education.   Patient is single.     PHYSICAL EXAM  Filed Vitals:   01/31/15 1425  BP: 129/78  Pulse: 81  Height: 5' (1.524 m)  Weight: 126 lb 9.6 oz (57.425 kg)   Body mass index is 24.72 kg/(m^2). Generalized: Well developed, in no acute distress    Neurological examination  Mentation: Alert oriented to time, place, history taking. Follows all commands speech and language fluent Cranial nerve II-XII: Pupils were equal round reactive to light. Extraocular movements were full, visual field were full on confrontational test. Facial sensation and strength were normal. Uvula tongue midline. Head turning and shoulder shrug were normal and symmetric. Motor: The motor testing reveals 5 over 5 strength of all 4 extremities. Good symmetric motor tone is noted throughout.  Sensory: Sensory testing is intact to soft touch on all 4 extremities. No evidence of extinction is noted.  Coordination: Cerebellar testing reveals good finger-nose-finger and heel-to-shin bilaterally.  Gait and station: Patient is able to stand from a sitting position. Uses a walker when ambulating.  Gait is slow and cautious. Tandem gait not attempted. Romberg is negative. No drift is seen.  Reflexes: Deep  tendon reflexes are symmetric and normal bilaterally.  DIAGNOSTIC DATA (LABS, IMAGING, TESTING) - I reviewed patient records, labs, notes, testing and imaging myself where available.  Lab Results  Component Value Date   WBC 10.2 05/19/2014   HGB 12.8 05/19/2014   HCT 36.7 05/19/2014   MCV 89 05/19/2014   PLT 223.0 01/25/2014      Component Value Date/Time   NA 136 08/12/2014 1159   NA 136 05/19/2014 1506   K 4.4 08/12/2014 1159   CL 101 08/12/2014 1159   CO2 24 08/12/2014 1159   GLUCOSE 102* 08/12/2014 1159   GLUCOSE 99 05/19/2014 1506   BUN 10 08/12/2014 1159   BUN 9 05/19/2014 1506   CREATININE 0.8 08/12/2014 1159   CALCIUM 9.3 08/12/2014 1159   PROT 7.6 08/12/2014 1159   PROT 7.0 05/19/2014 1506   ALBUMIN 3.5 08/12/2014 1159   AST 21 08/12/2014 1159   ALT 16 08/12/2014 1159   ALKPHOS 47 08/12/2014 1159   BILITOT 0.4 08/12/2014 1159   GFRNONAA 78 05/19/2014 1506   GFRAA 90 05/19/2014 1506   Lab Results  Component Value Date   CHOL 174  08/12/2014   HDL 85.20 08/12/2014   LDLCALC 77 08/12/2014   LDLDIRECT 114.4 08/09/2010   TRIG 60.0 08/12/2014   CHOLHDL 2 08/12/2014    ASSESSMENT AND PLAN  81 y.o. year old female  has a past medical history of Hypertension;  depression and anxiety CVA (cerebral vascular accident) (11/01/2009); gait abnormality Hyperlipidemia; Fatigue;  here to follow-up .  Continue Aspirin for secondary stroke prevention Continue PT Use walker at all times Continue Celexa anxiety depression for now F/U in 6 months Nilda Riggs, [facility], APRN  [facility] [address], [address] [place], [place] [address] [phone]

## 2015-01-31 NOTE — Patient Instructions (Signed)
Continue Aspirin for secondary stroke prevention Continue PT Use walker at all times F/U in 6 months

## 2015-01-31 NOTE — Progress Notes (Signed)
I agree with the above plan 

## 2015-02-22 ENCOUNTER — Telehealth: Payer: Self-pay

## 2015-02-22 NOTE — Telephone Encounter (Signed)
Called patient to schedule Prolia and she advised she is now seeing Dr.Stoneking, she will follow up with him.     KP

## 2015-02-23 ENCOUNTER — Other Ambulatory Visit: Payer: Self-pay | Admitting: Family Medicine

## 2015-03-08 ENCOUNTER — Ambulatory Visit (INDEPENDENT_AMBULATORY_CARE_PROVIDER_SITE_OTHER): Payer: Medicare Other

## 2015-03-08 DIAGNOSIS — M810 Age-related osteoporosis without current pathological fracture: Secondary | ICD-10-CM | POA: Diagnosis not present

## 2015-03-08 MED ORDER — DENOSUMAB 60 MG/ML ~~LOC~~ SOLN
60.0000 mg | Freq: Once | SUBCUTANEOUS | Status: AC
  Administered 2015-03-08: 60 mg via SUBCUTANEOUS

## 2015-04-22 ENCOUNTER — Emergency Department (HOSPITAL_COMMUNITY): Payer: Medicare Other

## 2015-04-22 ENCOUNTER — Encounter (HOSPITAL_COMMUNITY): Payer: Self-pay | Admitting: Emergency Medicine

## 2015-04-22 ENCOUNTER — Emergency Department (HOSPITAL_COMMUNITY)
Admission: EM | Admit: 2015-04-22 | Discharge: 2015-04-22 | Disposition: A | Discharge status: Home / Self Care | Payer: Medicare Other | Attending: Emergency Medicine | Admitting: Emergency Medicine

## 2015-04-22 DIAGNOSIS — E785 Hyperlipidemia, unspecified: Secondary | ICD-10-CM | POA: Diagnosis not present

## 2015-04-22 DIAGNOSIS — I10 Essential (primary) hypertension: Secondary | ICD-10-CM | POA: Insufficient documentation

## 2015-04-22 DIAGNOSIS — M6281 Muscle weakness (generalized): Secondary | ICD-10-CM | POA: Diagnosis present

## 2015-04-22 DIAGNOSIS — Z8673 Personal history of transient ischemic attack (TIA), and cerebral infarction without residual deficits: Secondary | ICD-10-CM | POA: Insufficient documentation

## 2015-04-22 DIAGNOSIS — Z7982 Long term (current) use of aspirin: Secondary | ICD-10-CM | POA: Insufficient documentation

## 2015-04-22 DIAGNOSIS — M810 Age-related osteoporosis without current pathological fracture: Secondary | ICD-10-CM | POA: Diagnosis not present

## 2015-04-22 DIAGNOSIS — Z79899 Other long term (current) drug therapy: Secondary | ICD-10-CM | POA: Diagnosis not present

## 2015-04-22 DIAGNOSIS — M1990 Unspecified osteoarthritis, unspecified site: Secondary | ICD-10-CM | POA: Diagnosis not present

## 2015-04-22 DIAGNOSIS — N3000 Acute cystitis without hematuria: Secondary | ICD-10-CM

## 2015-04-22 LAB — CBC
HCT: 42.1 % (ref 36.0–46.0)
Hemoglobin: 13.9 g/dL (ref 12.0–15.0)
MCH: 31.4 pg (ref 26.0–34.0)
MCHC: 33 g/dL (ref 30.0–36.0)
MCV: 95 fL (ref 78.0–100.0)
PLATELETS: 215 10*3/uL (ref 150–400)
RBC: 4.43 MIL/uL (ref 3.87–5.11)
RDW: 14 % (ref 11.5–15.5)
WBC: 9.8 10*3/uL (ref 4.0–10.5)

## 2015-04-22 LAB — URINALYSIS, ROUTINE W REFLEX MICROSCOPIC
Bilirubin Urine: NEGATIVE
GLUCOSE, UA: NEGATIVE mg/dL
HGB URINE DIPSTICK: NEGATIVE
KETONES UR: 40 mg/dL — AB
Nitrite: POSITIVE — AB
PROTEIN: NEGATIVE mg/dL
Specific Gravity, Urine: 1.017 (ref 1.005–1.030)
UROBILINOGEN UA: 1 mg/dL (ref 0.0–1.0)
pH: 7 (ref 5.0–8.0)

## 2015-04-22 LAB — I-STAT TROPONIN, ED: TROPONIN I, POC: 0 ng/mL (ref 0.00–0.08)

## 2015-04-22 LAB — BASIC METABOLIC PANEL
ANION GAP: 8 (ref 5–15)
BUN: 14 mg/dL (ref 6–20)
CALCIUM: 9 mg/dL (ref 8.9–10.3)
CO2: 26 mmol/L (ref 22–32)
Chloride: 106 mmol/L (ref 101–111)
Creatinine, Ser: 0.78 mg/dL (ref 0.44–1.00)
GFR calc non Af Amer: >60 mL/min (ref >60)
Glucose, Bld: 115 mg/dL — ABNORMAL HIGH (ref 65–99)
Potassium: 4.3 mmol/L (ref 3.5–5.1)
SODIUM: 140 mmol/L (ref 135–145)

## 2015-04-22 LAB — URINE MICROSCOPIC-ADD ON

## 2015-04-22 MED ORDER — DEXTROSE 5 % IV SOLN
1.0000 g | Freq: Once | INTRAVENOUS | Status: AC
  Administered 2015-04-22: 1 g via INTRAVENOUS
  Filled 2015-04-22: qty 10

## 2015-04-22 MED ORDER — CIPROFLOXACIN HCL 500 MG PO TABS: 500.0000 mg | ORAL_TABLET | Freq: Two times a day (BID) | ORAL | Status: DC

## 2015-04-22 NOTE — ED Notes (Signed)
Pt reported having lt leg weakness and difficulty getting OOB this morning. Pt reported having previous CVA with deficits to lt side.  Pt reported the symptoms are resolving some. (+)PMS, CRT brisk, no deformity/swelling/bruising noted and no LROM. NIH O except for weakness to lt leg. Pt required supervision assist with transfers.

## 2015-04-22 NOTE — Discharge Instructions (Signed)

## 2015-04-22 NOTE — ED Provider Notes (Signed)
CSN: [id number]     Arrival date & time 04/22/15  1401 History   First MD Initiated Contact with Patient 04/22/15 1504     Chief Complaint  Patient presents with  . Extremity Weakness     (Consider location/radiation/quality/duration/timing/severity/associated sxs/prior Treatment) HPI Comments: Hx of CVA with residual L leg weakness 5 years ago. Has been calling sister frequently saying she is having difficulty with moving her L leg. This happens from time to time, but has happened multiple times over the past week, which is a much higher frequency. Her PCP, Dr. Pete Glatter, recently started oxybutynin for better bladder control.  Daughter states she's had a foul, ammonia like odor to her room for the past 2 weeks.  Patient is a 81 y.o. female presenting with extremity weakness. The history is provided by the patient.  Extremity Weakness This is a recurrent problem. The current episode started 6 to 12 hours ago. The problem occurs constantly. The problem has been gradually improving. Pertinent negatives include no chest pain, no abdominal pain, no headaches and no shortness of breath. Nothing aggravates the symptoms. Nothing relieves the symptoms.    Past Medical History  Diagnosis Date  . Hypertension   . Osteoporosis   . CVA (cerebral vascular accident) 11/01/2009  . Osteoarthritis   . Hyperlipidemia   . Fatigue     related to statin?  . Bladder incontinence    Past Surgical History  Procedure Laterality Date  . Abdominal hysterectomy    . Cataract extraction      Left  . Vein ligation      Right Leg   Family History  Problem Relation Age of Onset  . Coronary artery disease    . Hip fracture Mother     B/L  . Heart attack Father   . Heart disease Brother    History  Substance Use Topics  . Smoking status: Never Smoker   . Smokeless tobacco: Never Used  . Alcohol Use: No   OB History    No data available     Review of Systems  Constitutional: Negative for fever  and chills.  Respiratory: Negative for shortness of breath.   Cardiovascular: Negative for chest pain.  Gastrointestinal: Negative for vomiting and abdominal pain.  Musculoskeletal: Positive for extremity weakness.  Neurological: Negative for headaches.  All other systems reviewed and are negative.     Allergies  Review of patient's allergies indicates no known allergies.  Home Medications   Prior to Admission medications   Medication Sig Start Date End Date Taking? Authorizing Provider  aspirin 81 MG tablet Take 81 mg by mouth every morning.    Yes Historical Provider, MD  calcium-vitamin D (OSCAL WITH D) 500-200 MG-UNIT per tablet Take 1 tablet by mouth 2 (two) times daily. SPLIT TABLETS IN HALF FOR EASIER SWALLOWING PURPOSES   Yes Historical Provider, MD  citalopram (CELEXA) 10 MG tablet TAKE 2 TABLETS (20 MG TOTAL) BY MOUTH DAILY. Patient taking differently: Take 3 tablets by mouth once daily in the evening 07/28/14  Yes Micki Riley, MD  ibuprofen (ADVIL,MOTRIN) 200 MG tablet Take 400 mg by mouth every 6 (six) hours as needed. For pain   Yes Historical Provider, MD  losartan (COZAAR) 25 MG tablet TAKE 1 TABLET TWICE A DAY 11/24/14  Yes Lelon Perla, DO  Multiple Vitamin (MULTIVITAMIN WITH MINERALS) TABS Take 1 tablet by mouth every morning.    Yes Historical Provider, MD  pravastatin (PRAVACHOL) 20 MG tablet Take 20  mg by mouth at bedtime.   Yes Historical Provider, MD  oxybutynin (DITROPAN-XL) 5 MG 24 hr tablet Take 5 mg by mouth daily. 04/03/15   Historical Provider, MD   BP 144/96 mmHg  Pulse 76  Temp(Src) 98.1 F (36.7 C) (Oral)  Resp 18  Ht 5\' 2"  (1.575 m)  Wt 135 lb (61.236 kg)  BMI 24.69 kg/m2  SpO2 96% Physical Exam  Constitutional: She is oriented to person, place, and time. She appears well-developed and well-nourished. No distress.  HENT:  Head: Normocephalic and atraumatic.  Mouth/Throat: Oropharynx is clear and moist.  Eyes: EOM are normal. Pupils are  equal, round, and reactive to light.  Neck: Normal range of motion. Neck supple.  Cardiovascular: Normal rate and regular rhythm.  Exam reveals no friction rub.   No murmur heard. Pulmonary/Chest: Effort normal and breath sounds normal. No respiratory distress. She has no wheezes. She has no rales.  Abdominal: Soft. She exhibits no distension. There is no tenderness. There is no rebound.  Musculoskeletal: Normal range of motion. She exhibits no edema.  Neurological: She is alert and oriented to person, place, and time.  Skin: She is not diaphoretic.  Nursing note and vitals reviewed.   ED Course  Procedures (including critical care time) Labs Review Labs Reviewed  CBC  BASIC METABOLIC PANEL  URINALYSIS, ROUTINE W REFLEX MICROSCOPIC (NOT AT [facility])  Rosezena SensorI-STAT TROPOININ, ED    Imaging Review Dg Chest 2 View  04/22/2015   CLINICAL DATA:  Weakness today; no known cardiopulmonary problems; non smoker; HTN; hx stroke 1-2 years ago per pt  EXAM: CHEST  2 VIEW  COMPARISON:  05/23/2013  FINDINGS: Cardiac silhouette normal in size and configuration. Aorta is mildly uncoiled and tortuous. No mediastinal or hilar masses or evidence of adenopathy.  Lungs are mildly hyperexpanded but clear. No pleural effusion or pneumothorax.  Bony thorax is demineralized. Mild compression fractures of several vertebrae are noted. These were present on the prior study.  IMPRESSION: No acute cardiopulmonary disease.   Electronically Signed   By: Amie Portlandavid  Ormond M.D.   On: 04/22/2015 15:43   Ct Head Wo Contrast  04/22/2015   CLINICAL DATA:  Left leg weakness.  Left-sided deficit.  EXAM: CT HEAD WITHOUT CONTRAST  TECHNIQUE: Contiguous axial images were obtained from the base of the skull through the vertex without intravenous contrast.  COMPARISON:  05/23/2013- CT brain, MR brain 05/12/2014  FINDINGS: There is no evidence of mass effect, midline shift, or extra-axial fluid collections. There is no evidence of a space-occupying  lesion or intracranial hemorrhage. There is no evidence of a cortical-based area of acute infarction. There are old bilateral thalamic and basal ganglia infarcts. There is generalized cerebral atrophy. There is periventricular white matter low attenuation likely secondary to microangiopathy.  The ventricles and sulci are appropriate for the patient's age. The basal cisterns are patent.  Visualized portions of the orbits are unremarkable. The visualized portions of the paranasal sinuses and mastoid air cells are unremarkable. Cerebrovascular atherosclerotic calcifications are noted.  The osseous structures are unremarkable.  IMPRESSION: No acute intracranial pathology.  Old bilateral thalamic and basal ganglia infarcts.   Electronically Signed   By: Elige KoHetal  Patel   On: 04/22/2015 16:52     EKG Interpretation   Date/Time:  Saturday April 22 2015 15:51:48 EDT Ventricular Rate:  70 PR Interval:  161 QRS Duration: 83 QT Interval:  419 QTC Calculation: 452 R Axis:   68 Text Interpretation:  Sinus rhythm Baseline wander  in lead(s) V3 No  significant change since last tracing Confirmed by [facility]  MD, Tiffney Haughton  ([id number]) on 04/22/2015 4:12:16 PM      MDM   Final diagnoses:  Acute cystitis without hematuria    88 rolled female here with leg weakness. History of stroke with residual left leg weakness. She has history of having intermittent leg weakness. She's having it much more frequent over the past week. Here she feels like she can't get her left leg moving. She is acting normal per family. There is some concern about possible UTI. We'll scan her head, check labs, check urine.  Urine shows UTI. Other workup unremarkable. Stable for discharge, she's well appearing, nontoxic, not vomiting. Given strict return precautions.    Elwin Mocha, MD 04/22/15 2231

## 2015-05-18 ENCOUNTER — Emergency Department (HOSPITAL_COMMUNITY): Payer: Medicare Other

## 2015-05-18 ENCOUNTER — Encounter (HOSPITAL_COMMUNITY): Payer: Self-pay

## 2015-05-18 ENCOUNTER — Inpatient Hospital Stay (HOSPITAL_COMMUNITY)
Admission: EM | Admit: 2015-05-18 | Discharge: 2015-05-23 | DRG: 200 | Disposition: A | Discharge status: Skilled Nursing Facility | Payer: Medicare Other | Attending: General Surgery | Admitting: General Surgery

## 2015-05-18 DIAGNOSIS — S272XXA Traumatic hemopneumothorax, initial encounter: Secondary | ICD-10-CM

## 2015-05-18 DIAGNOSIS — I10 Essential (primary) hypertension: Secondary | ICD-10-CM | POA: Diagnosis present

## 2015-05-18 DIAGNOSIS — S270XXA Traumatic pneumothorax, initial encounter: Principal | ICD-10-CM | POA: Diagnosis present

## 2015-05-18 DIAGNOSIS — Z8673 Personal history of transient ischemic attack (TIA), and cerebral infarction without residual deficits: Secondary | ICD-10-CM

## 2015-05-18 DIAGNOSIS — E785 Hyperlipidemia, unspecified: Secondary | ICD-10-CM | POA: Diagnosis present

## 2015-05-18 DIAGNOSIS — S2242XA Multiple fractures of ribs, left side, initial encounter for closed fracture: Secondary | ICD-10-CM | POA: Diagnosis present

## 2015-05-18 DIAGNOSIS — W07XXXA Fall from chair, initial encounter: Secondary | ICD-10-CM | POA: Diagnosis present

## 2015-05-18 DIAGNOSIS — M810 Age-related osteoporosis without current pathological fracture: Secondary | ICD-10-CM | POA: Diagnosis present

## 2015-05-18 DIAGNOSIS — J939 Pneumothorax, unspecified: Secondary | ICD-10-CM

## 2015-05-18 DIAGNOSIS — Y92009 Unspecified place in unspecified non-institutional (private) residence as the place of occurrence of the external cause: Secondary | ICD-10-CM

## 2015-05-18 DIAGNOSIS — N390 Urinary tract infection, site not specified: Secondary | ICD-10-CM | POA: Diagnosis present

## 2015-05-18 DIAGNOSIS — W19XXXA Unspecified fall, initial encounter: Secondary | ICD-10-CM

## 2015-05-18 DIAGNOSIS — Z9689 Presence of other specified functional implants: Secondary | ICD-10-CM

## 2015-05-18 DIAGNOSIS — I69351 Hemiplegia and hemiparesis following cerebral infarction affecting right dominant side: Secondary | ICD-10-CM | POA: Diagnosis not present

## 2015-05-18 DIAGNOSIS — S299XXA Unspecified injury of thorax, initial encounter: Secondary | ICD-10-CM

## 2015-05-18 DIAGNOSIS — R0602 Shortness of breath: Secondary | ICD-10-CM | POA: Diagnosis present

## 2015-05-18 LAB — CBC WITH DIFFERENTIAL/PLATELET
BASOS PCT: 0 % (ref 0–1)
Basophils Absolute: 0 10*3/uL (ref 0.0–0.1)
Eosinophils Absolute: 0 10*3/uL (ref 0.0–0.7)
Eosinophils Relative: 0 % (ref 0–5)
HCT: 38.8 % (ref 36.0–46.0)
Hemoglobin: 13.1 g/dL (ref 12.0–15.0)
LYMPHS PCT: 7 % — AB (ref 12–46)
Lymphs Abs: 0.8 10*3/uL (ref 0.7–4.0)
MCH: 31.1 pg (ref 26.0–34.0)
MCHC: 33.8 g/dL (ref 30.0–36.0)
MCV: 92.2 fL (ref 78.0–100.0)
Monocytes Absolute: 0.7 10*3/uL (ref 0.1–1.0)
Monocytes Relative: 6 % (ref 3–12)
NEUTROS ABS: 10.3 10*3/uL — AB (ref 1.7–7.7)
NEUTROS PCT: 87 % — AB (ref 43–77)
Platelets: 189 10*3/uL (ref 150–400)
RBC: 4.21 MIL/uL (ref 3.87–5.11)
RDW: 13.8 % (ref 11.5–15.5)
WBC: 12 10*3/uL — AB (ref 4.0–10.5)

## 2015-05-18 LAB — HEPATIC FUNCTION PANEL
ALBUMIN: 3.6 g/dL (ref 3.5–5.0)
ALT: 13 U/L — AB (ref 14–54)
AST: 24 U/L (ref 15–41)
Alkaline Phosphatase: 48 U/L (ref 38–126)
BILIRUBIN DIRECT: 0.2 mg/dL (ref 0.1–0.5)
BILIRUBIN TOTAL: 0.7 mg/dL (ref 0.3–1.2)
Indirect Bilirubin: 0.5 mg/dL (ref 0.3–0.9)
Total Protein: 6.5 g/dL (ref 6.5–8.1)

## 2015-05-18 LAB — BASIC METABOLIC PANEL
Anion gap: 11 (ref 5–15)
BUN: 6 mg/dL (ref 6–20)
CHLORIDE: 96 mmol/L — AB (ref 101–111)
CO2: 24 mmol/L (ref 22–32)
Calcium: 8.7 mg/dL — ABNORMAL LOW (ref 8.9–10.3)
Creatinine, Ser: 0.72 mg/dL (ref 0.44–1.00)
GFR calc non Af Amer: >60 mL/min (ref >60)
GLUCOSE: 131 mg/dL — AB (ref 65–99)
POTASSIUM: 3.7 mmol/L (ref 3.5–5.1)
Sodium: 131 mmol/L — ABNORMAL LOW (ref 135–145)

## 2015-05-18 MED ORDER — ONDANSETRON HCL 4 MG/2ML IJ SOLN
4.0000 mg | INTRAMUSCULAR | Status: DC | PRN
  Administered 2015-05-18: 4 mg via INTRAVENOUS
  Filled 2015-05-18 (×2): qty 2

## 2015-05-18 MED ORDER — LIDOCAINE HCL (PF) 1 % IJ SOLN
INTRAMUSCULAR | Status: AC
  Filled 2015-05-18: qty 5

## 2015-05-18 MED ORDER — IOHEXOL 300 MG/ML  SOLN
80.0000 mL | Freq: Once | INTRAMUSCULAR | Status: AC | PRN
  Administered 2015-05-18: 100 mL via INTRAVENOUS

## 2015-05-18 MED ORDER — FENTANYL CITRATE (PF) 100 MCG/2ML IJ SOLN
25.0000 ug | INTRAMUSCULAR | Status: AC | PRN
  Administered 2015-05-18 (×3): 25 ug via INTRAVENOUS
  Filled 2015-05-18 (×4): qty 2

## 2015-05-18 MED ORDER — OXYCODONE HCL 5 MG PO TABS
2.5000 mg | ORAL_TABLET | ORAL | Status: DC | PRN
  Administered 2015-05-18 – 2015-05-22 (×12): 5 mg via ORAL
  Filled 2015-05-18 (×12): qty 1

## 2015-05-18 MED ORDER — CIPROFLOXACIN IN D5W 400 MG/200ML IV SOLN
400.0000 mg | Freq: Two times a day (BID) | INTRAVENOUS | Status: DC
  Administered 2015-05-18 – 2015-05-23 (×10): 400 mg via INTRAVENOUS
  Filled 2015-05-18 (×12): qty 200

## 2015-05-18 MED ORDER — LIDOCAINE-EPINEPHRINE (PF) 2 %-1:200000 IJ SOLN
20.0000 mL | Freq: Once | INTRAMUSCULAR | Status: AC
  Administered 2015-05-18: 20 mL via INTRADERMAL
  Filled 2015-05-18: qty 20

## 2015-05-18 NOTE — Procedures (Signed)
Chest Tube Insertion Procedure Note  Indications:  Clinically significant Pneumothorax  Pre-operative Diagnosis: Pneumothorax  Post-operative Diagnosis: Pneumothorax  Procedure Details  Informed consent was obtained for the procedure, including sedation.  Risks of lung perforation, hemorrhage, arrhythmia, and adverse drug reaction were discussed.   After sterile skin prep, using standard technique, a 20 French tube was placed in the left anterolateral 5 rib space.  Findings: Rush of air  Estimated Blood Loss:  Minimal         Specimens:  None              Complications:  None; patient tolerated the procedure well.         Disposition: remained in ED in her room, stable.         Condition: stable  Attending Attestation: I performed the procedure.

## 2015-05-18 NOTE — ED Provider Notes (Signed)
CSN: [id number]     Arrival date & time 05/18/15  1631 History   First MD Initiated Contact with Patient 05/18/15 1632     Chief Complaint  Patient presents with  . Fall  . Shortness of Breath     (Consider location/radiation/quality/duration/timing/severity/associated sxs/prior Treatment) Patient is a 81 y.o. female presenting with fall. The history is provided by the patient and a relative.  Fall This is a new problem. The current episode started 1 to 2 hours ago. The problem occurs constantly. The problem has not changed since onset.Associated symptoms include chest pain (left side and back) and shortness of breath. Pertinent negatives include no abdominal pain. Nothing aggravates the symptoms. She has tried nothing for the symptoms. The treatment provided no relief.    Past Medical History  Diagnosis Date  . Hypertension   . Osteoporosis   . CVA (cerebral vascular accident) 11/01/2009  . Osteoarthritis   . Hyperlipidemia   . Fatigue     related to statin?  . Bladder incontinence    Past Surgical History  Procedure Laterality Date  . Abdominal hysterectomy    . Cataract extraction      Left  . Vein ligation      Right Leg   Family History  Problem Relation Age of Onset  . Coronary artery disease    . Hip fracture Mother     B/L  . Heart attack Father   . Heart disease Brother    Social History  Substance Use Topics  . Smoking status: Never Smoker   . Smokeless tobacco: Never Used  . Alcohol Use: No   OB History    No data available     Review of Systems  Respiratory: Positive for shortness of breath.   Cardiovascular: Positive for chest pain (left side and back).  Gastrointestinal: Negative for abdominal pain.  All other systems reviewed and are negative.     Allergies  Review of patient's allergies indicates no known allergies.  Home Medications   Prior to Admission medications   Medication Sig Start Date End Date Taking? Authorizing Provider   aspirin 81 MG tablet Take 81 mg by mouth every morning.    Yes Historical Provider, MD  calcium-vitamin D (OSCAL WITH D) 500-200 MG-UNIT per tablet Take 1 tablet by mouth 2 (two) times daily. SPLIT TABLETS IN HALF FOR EASIER SWALLOWING PURPOSES   Yes Historical Provider, MD  citalopram (CELEXA) 10 MG tablet TAKE 2 TABLETS (20 MG TOTAL) BY MOUTH DAILY. Patient taking differently: Take 3 tablets by mouth once daily in the evening 07/28/14  Yes Micki Riley, MD  ibuprofen (ADVIL,MOTRIN) 200 MG tablet Take 400 mg by mouth every 6 (six) hours as needed. For pain   Yes Historical Provider, MD  losartan (COZAAR) 25 MG tablet TAKE 1 TABLET TWICE A DAY 11/24/14  Yes Lelon Perla, DO  Multiple Vitamin (MULTIVITAMIN WITH MINERALS) TABS Take 1 tablet by mouth every morning.    Yes Historical Provider, MD  oxybutynin (DITROPAN-XL) 5 MG 24 hr tablet Take 5 mg by mouth daily. 04/03/15  Yes Historical Provider, MD  pravastatin (PRAVACHOL) 20 MG tablet Take 20 mg by mouth at bedtime.   Yes Historical Provider, MD   BP 174/69 mmHg  Pulse 65  Temp(Src) 98.3 F (36.8 C) (Oral)  Resp 17  SpO2 98% Physical Exam  Constitutional: She is oriented to person, place, and time. She appears well-developed and well-nourished. No distress.  HENT:  Head: Normocephalic.  Eyes:  Conjunctivae are normal.  Neck: Neck supple. No tracheal deviation present.  Cardiovascular: Normal rate and regular rhythm.   Pulmonary/Chest: Effort normal. No accessory muscle usage. No respiratory distress. She has decreased breath sounds in the left upper field and the left middle field. She has no rhonchi. She has no rales. She exhibits tenderness (left sided) and crepitus (on left lateral chest). She exhibits no deformity.  Abdominal: Soft. She exhibits no distension. There is no tenderness. There is no rebound.  Musculoskeletal:  No deformity, chronic contractures of left ankle with spasm  Neurological: She is alert and oriented to  person, place, and time.  Skin: Skin is warm and dry.  Psychiatric: She has a normal mood and affect.    ED Course  Procedures (including critical care time) Emergency Focused Ultrasound Exam Limited Ultrasound of the Abdomen and Pericardium (FAST Exam)  Performed and interpreted by Dr. Clydene Pugh Indication: Trauma Multiple views of the abdomen and pericardium are obtained with a multi-frequency probe. Findings: no anechoic fluid in abdomen, no anechoic fluid surrounding heart Interpretation: nohemoperitoneum, no pericardial effusion, without tamponade Images archived electronically.  CPT Codes: cardiac 16109, abdomen [phone] (study includes both codes)  Emergency Focused Ultrasound Exam Limited Thorax   Performed and interpreted by Dr Clydene Pugh Longitudinal view of anterior left and right lung fields in real-time with linear probe. Indication: Trauma to chest Findings: no lung sliding no B lines on left, normal on right Interpretation: evidence of left pneumothorax. Images electronically archived.   CPT code: 09811   Labs Review Labs Reviewed  CBC WITH DIFFERENTIAL/PLATELET - Abnormal; Notable for the following:    WBC 12.0 (*)    Neutrophils Relative % 87 (*)    Neutro Abs 10.3 (*)    Lymphocytes Relative 7 (*)    All other components within normal limits  BASIC METABOLIC PANEL - Abnormal; Notable for the following:    Sodium 131 (*)    Chloride 96 (*)    Glucose, Bld 131 (*)    Calcium 8.7 (*)    All other components within normal limits  HEPATIC FUNCTION PANEL - Abnormal; Notable for the following:    ALT 13 (*)    All other components within normal limits  URINALYSIS, ROUTINE W REFLEX MICROSCOPIC (NOT AT [facility])    Imaging Review Dg Ankle Complete Left  05/18/2015   CLINICAL DATA:  Status post fall today. Mild left ankle swelling. Pain. Initial encounter.  EXAM: LEFT ANKLE COMPLETE - 3+ VIEW  COMPARISON:  None.  FINDINGS: Imaged bones, joints and soft tissues appear  normal.  IMPRESSION: Negative exam.   Electronically Signed   By: Drusilla Kanner M.D.   On: 05/18/2015 18:02   Ct Head Wo Contrast  05/18/2015   CLINICAL DATA:  Fall from standing position mechanical . C/o pain under left scapula, lower back pain and sob  EXAM: CT HEAD WITHOUT CONTRAST  CT CERVICAL SPINE WITHOUT CONTRAST  TECHNIQUE: Multidetector CT imaging of the head and cervical spine was performed following the standard protocol without intravenous contrast. Multiplanar CT image reconstructions of the cervical spine were also generated.  COMPARISON:  04/22/2015  FINDINGS: CT HEAD FINDINGS  Ventricles are normal configuration. There is ventricular and sulcal enlargement reflecting mild generalized atrophy. There are no parenchymal masses or mass effect. There is no evidence of a cortical infarct. Patchy areas of white matter hypoattenuation noted consistent with moderate chronic microvascular ischemic change. Possible old lacune infarct in the left pons.  There are no extra-axial  masses or abnormal fluid collections.  There is no intracranial hemorrhage.  Sinuses and mastoid air cells are essentially clear. No skull fracture.  There is soft tissue air below the left temporal bone posterior to the temporomandibular joint and extending along the left posterior upper neck. The etiology of this is unclear.  CT CERVICAL SPINE FINDINGS  Soft tissue air is seen along the posterior and left lateral aspect of the neck extending to the left supraclavicular region. The etiology of this is from the left pneumothorax.  No cervical spine fracture. No spondylolisthesis. Bones are demineralized. Facet degenerative change, mild, as noted on the right at C 7-T1. There is no significant central stenosis. Uncovertebral spurring leads to mild left neural foraminal narrowing at C5-C6. Facet spurring leads to mild neural foraminal narrowing on the right at C7-T1.  There is a moderate left pneumothorax. A minimally displaced  fracture the posterior left third rib is noted.  IMPRESSION: HEAD CT:  No acute intracranial abnormalities.  No skull fracture.  CERVICAL CT: No cervical spine fracture or spondylolisthesis. Mildly displaced left posterior third rib fracture. Moderate left pneumothorax with soft tissue air extending along the left posterior and lateral neck.   Electronically Signed   By: Amie Portland M.D.   On: 05/18/2015 19:22   Ct Chest W Contrast  05/18/2015   CLINICAL DATA:  81 year old female with history of trauma from a fall from a standing position. Pain underneath the left scapula and in the lower back. Shortness of breath.  EXAM: CT CHEST, ABDOMEN, AND PELVIS WITH CONTRAST  TECHNIQUE: Multidetector CT imaging of the chest, abdomen and pelvis was performed following the standard protocol during bolus administration of intravenous contrast.  CONTRAST:  OMNIPAQUE IOHEXOL 300 MG/ML  SOLN  COMPARISON:  No priors.  FINDINGS: CT CHEST FINDINGS  Mediastinum/Lymph Nodes: No abnormal high attenuation fluid within the mediastinum to suggest posttraumatic mediastinal hematoma. No evidence of posttraumatic aortic dissection/transection. Heart size is normal. There is no significant pericardial fluid, thickening or pericardial calcification. There is atherosclerosis of the thoracic aorta, the great vessels of the mediastinum and the coronary arteries, including calcified atherosclerotic plaque in the left main, left anterior descending and right coronary arteries. No pathologically enlarged mediastinal or hilar lymph nodes. Esophagus is unremarkable in appearance. No axillary lymphadenopathy.  Lungs/Pleura: Moderate left-sided pneumothorax occupying approximately 30% of the volume of the left hemithorax. Small volume of intermediate attenuation fluid in the left hemithorax, likely minimally proteinaceous/hemorrhagic. Passive atelectasis in portions of the left upper and left lower lobes dependently. Small amount of atelectasis  or scarring in the basal portion of the right lower lobe dependently.  Musculoskeletal/Soft Tissues: Acute minimally displaced fractures of the left third, fourth, fifth and sixth ribs posteriorly are noted. There is emphysema in the adjacent fat and muscular tissues of the left chest wall both anteriorly and posteriorly. Multiple old chronic compression fractures are noted, most evident at T7, T8 and T10, most severe at T10 where there is 50% loss of anterior vertebral body height.  CT ABDOMEN AND PELVIS FINDINGS  Hepatobiliary: No signs of acute traumatic injury to the liver. No cystic or solid hepatic lesions. Minimal intrahepatic biliary ductal dilatation. Common bile duct measures 10 mm in the porta hepatis. Possible noncalcified stone in the distal aspect of the common bile duct best appreciated at the transition from image 62-63. Calcified gallstone in the dependent portion of the gallbladder. Gallbladder does not appear distended, and there is no gallbladder wall thickening or pericholecystic  fluid to suggest an acute cholecystitis at this time.  Pancreas: No pancreatic mass. Pancreatic duct is upper limits of normal in size measuring 3 mm in the pancreatic head. No pancreatic or peripancreatic fluid or inflammatory changes.  Spleen: No findings to suggest acute traumatic injury to the spleen.  Adrenals/Urinary Tract: Multiple low-attenuation lesions in the kidneys bilaterally, compatible with simple cysts, largest of which is exophytic extending off the posterior aspect of the upper pole of the left kidney measuring up to 2.9 cm. No hydroureteronephrosis. Urinary bladder is normal in appearance.  Stomach/Bowel: No signs of acute traumatic injury to the stomach, small bowel or colon. Normal appearance of the stomach. No pathologic dilatation of small bowel or colon. Numerous colonic diverticulae are noted, without surrounding inflammatory changes to suggest an acute diverticulitis at this time.   Vascular/Lymphatic: No evidence of acute traumatic injury to the major arteries or veins of the abdomen and pelvis. Atherosclerosis throughout the abdominal and pelvic vasculature, without evidence of aneurysm or dissection. No lymphadenopathy noted in the abdomen or pelvis.  Reproductive: Status post hysterectomy. Ovaries are not confidently identified may be surgically absent or atrophic.  Other: No high attenuation fluid collection within the peritoneal cavity or retroperitoneum to suggest posttraumatic hemorrhage. No significant volume of ascites. No pneumoperitoneum.  Musculoskeletal: Surgical clips in the right groin, likely from prior vascular access. No acute displaced fractures in the abdomen or pelvis. No aggressive appearing lytic or blastic lesions noted in the visualized portions of the axial and appendicular skeleton. Chronic appearing compression fracture of superior endplate of L1 and L3 are noted.  IMPRESSION: 1. Multiple minimally displaced posterior left-sided rib fractures involving the left third, fourth, fifth and sixth ribs, with moderate left hydropneumothorax (pleural fluid in this collection is mildly proteinaceous/hemorrhagic, but not a frank hemothorax). There is extensive gas in the subcutaneous fat and deep musculature of the left chest wall. 2. This is associated with some passive atelectasis in the left upper and left lower lobes. 3. No other signs of significant acute traumatic injury to the abdomen or pelvis. 4. There is mild intra and extrahepatic biliary ductal dilatation, and there appears to potentially be a noncalcified stone in the distal common bile duct. If there is any clinical concern for potential biliary tract obstruction, nonemergent follow-up evaluation with MRI of the abdomen with and without IV gadolinium with MRCP could be performed in the near future; however, this is best performed when the patient is clinically stable and able to reliably hold her breath during  the examination. 5. Atherosclerosis, including left main and 2 vessel coronary artery disease. 6. Colonic diverticulosis without evidence of acute diverticulitis at this time. 7. Additional incidental findings, as above. These results were called by telephone at the time of interpretation on 05/18/2015 at 7:36 pm to Dr. Lyndal Pulley, who verbally acknowledged these results.   Electronically Signed   By: Trudie Reed M.D.   On: 05/18/2015 19:38   Ct Cervical Spine Wo Contrast  05/18/2015   CLINICAL DATA:  Fall from standing position mechanical . C/o pain under left scapula, lower back pain and sob  EXAM: CT HEAD WITHOUT CONTRAST  CT CERVICAL SPINE WITHOUT CONTRAST  TECHNIQUE: Multidetector CT imaging of the head and cervical spine was performed following the standard protocol without intravenous contrast. Multiplanar CT image reconstructions of the cervical spine were also generated.  COMPARISON:  04/22/2015  FINDINGS: CT HEAD FINDINGS  Ventricles are normal configuration. There is ventricular and sulcal enlargement reflecting mild  generalized atrophy. There are no parenchymal masses or mass effect. There is no evidence of a cortical infarct. Patchy areas of white matter hypoattenuation noted consistent with moderate chronic microvascular ischemic change. Possible old lacune infarct in the left pons.  There are no extra-axial masses or abnormal fluid collections.  There is no intracranial hemorrhage.  Sinuses and mastoid air cells are essentially clear. No skull fracture.  There is soft tissue air below the left temporal bone posterior to the temporomandibular joint and extending along the left posterior upper neck. The etiology of this is unclear.  CT CERVICAL SPINE FINDINGS  Soft tissue air is seen along the posterior and left lateral aspect of the neck extending to the left supraclavicular region. The etiology of this is from the left pneumothorax.  No cervical spine fracture. No spondylolisthesis. Bones are  demineralized. Facet degenerative change, mild, as noted on the right at C 7-T1. There is no significant central stenosis. Uncovertebral spurring leads to mild left neural foraminal narrowing at C5-C6. Facet spurring leads to mild neural foraminal narrowing on the right at C7-T1.  There is a moderate left pneumothorax. A minimally displaced fracture the posterior left third rib is noted.  IMPRESSION: HEAD CT:  No acute intracranial abnormalities.  No skull fracture.  CERVICAL CT: No cervical spine fracture or spondylolisthesis. Mildly displaced left posterior third rib fracture. Moderate left pneumothorax with soft tissue air extending along the left posterior and lateral neck.   Electronically Signed   By: Amie Portland M.D.   On: 05/18/2015 19:22   Ct Abdomen Pelvis W Contrast  05/18/2015   CLINICAL DATA:  81 year old female with history of trauma from a fall from a standing position. Pain underneath the left scapula and in the lower back. Shortness of breath.  EXAM: CT CHEST, ABDOMEN, AND PELVIS WITH CONTRAST  TECHNIQUE: Multidetector CT imaging of the chest, abdomen and pelvis was performed following the standard protocol during bolus administration of intravenous contrast.  CONTRAST:  OMNIPAQUE IOHEXOL 300 MG/ML  SOLN  COMPARISON:  No priors.  FINDINGS: CT CHEST FINDINGS  Mediastinum/Lymph Nodes: No abnormal high attenuation fluid within the mediastinum to suggest posttraumatic mediastinal hematoma. No evidence of posttraumatic aortic dissection/transection. Heart size is normal. There is no significant pericardial fluid, thickening or pericardial calcification. There is atherosclerosis of the thoracic aorta, the great vessels of the mediastinum and the coronary arteries, including calcified atherosclerotic plaque in the left main, left anterior descending and right coronary arteries. No pathologically enlarged mediastinal or hilar lymph nodes. Esophagus is unremarkable in appearance. No axillary  lymphadenopathy.  Lungs/Pleura: Moderate left-sided pneumothorax occupying approximately 30% of the volume of the left hemithorax. Small volume of intermediate attenuation fluid in the left hemithorax, likely minimally proteinaceous/hemorrhagic. Passive atelectasis in portions of the left upper and left lower lobes dependently. Small amount of atelectasis or scarring in the basal portion of the right lower lobe dependently.  Musculoskeletal/Soft Tissues: Acute minimally displaced fractures of the left third, fourth, fifth and sixth ribs posteriorly are noted. There is emphysema in the adjacent fat and muscular tissues of the left chest wall both anteriorly and posteriorly. Multiple old chronic compression fractures are noted, most evident at T7, T8 and T10, most severe at T10 where there is 50% loss of anterior vertebral body height.  CT ABDOMEN AND PELVIS FINDINGS  Hepatobiliary: No signs of acute traumatic injury to the liver. No cystic or solid hepatic lesions. Minimal intrahepatic biliary ductal dilatation. Common bile duct measures 10 mm in  the porta hepatis. Possible noncalcified stone in the distal aspect of the common bile duct best appreciated at the transition from image [id number]. Calcified gallstone in the dependent portion of the gallbladder. Gallbladder does not appear distended, and there is no gallbladder wall thickening or pericholecystic fluid to suggest an acute cholecystitis at this time.  Pancreas: No pancreatic mass. Pancreatic duct is upper limits of normal in size measuring 3 mm in the pancreatic head. No pancreatic or peripancreatic fluid or inflammatory changes.  Spleen: No findings to suggest acute traumatic injury to the spleen.  Adrenals/Urinary Tract: Multiple low-attenuation lesions in the kidneys bilaterally, compatible with simple cysts, largest of which is exophytic extending off the posterior aspect of the upper pole of the left kidney measuring up to 2.9 cm. No hydroureteronephrosis.  Urinary bladder is normal in appearance.  Stomach/Bowel: No signs of acute traumatic injury to the stomach, small bowel or colon. Normal appearance of the stomach. No pathologic dilatation of small bowel or colon. Numerous colonic diverticulae are noted, without surrounding inflammatory changes to suggest an acute diverticulitis at this time.  Vascular/Lymphatic: No evidence of acute traumatic injury to the major arteries or veins of the abdomen and pelvis. Atherosclerosis throughout the abdominal and pelvic vasculature, without evidence of aneurysm or dissection. No lymphadenopathy noted in the abdomen or pelvis.  Reproductive: Status post hysterectomy. Ovaries are not confidently identified may be surgically absent or atrophic.  Other: No high attenuation fluid collection within the peritoneal cavity or retroperitoneum to suggest posttraumatic hemorrhage. No significant volume of ascites. No pneumoperitoneum.  Musculoskeletal: Surgical clips in the right groin, likely from prior vascular access. No acute displaced fractures in the abdomen or pelvis. No aggressive appearing lytic or blastic lesions noted in the visualized portions of the axial and appendicular skeleton. Chronic appearing compression fracture of superior endplate of L1 and L3 are noted.  IMPRESSION: 1. Multiple minimally displaced posterior left-sided rib fractures involving the left third, fourth, fifth and sixth ribs, with moderate left hydropneumothorax (pleural fluid in this collection is mildly proteinaceous/hemorrhagic, but not a frank hemothorax). There is extensive gas in the subcutaneous fat and deep musculature of the left chest wall. 2. This is associated with some passive atelectasis in the left upper and left lower lobes. 3. No other signs of significant acute traumatic injury to the abdomen or pelvis. 4. There is mild intra and extrahepatic biliary ductal dilatation, and there appears to potentially be a noncalcified stone in the  distal common bile duct. If there is any clinical concern for potential biliary tract obstruction, nonemergent follow-up evaluation with MRI of the abdomen with and without IV gadolinium with MRCP could be performed in the near future; however, this is best performed when the patient is clinically stable and able to reliably hold her breath during the examination. 5. Atherosclerosis, including left main and 2 vessel coronary artery disease. 6. Colonic diverticulosis without evidence of acute diverticulitis at this time. 7. Additional incidental findings, as above. These results were called by telephone at the time of interpretation on 05/18/2015 at 7:36 pm to Dr. Lyndal Pulley, who verbally acknowledged these results.   Electronically Signed   By: Trudie Reed M.D.   On: 05/18/2015 19:38   Dg Pelvis Portable  05/18/2015   CLINICAL DATA:  Trauma.  Recent fall.  EXAM: PORTABLE PELVIS 1-2 VIEWS  COMPARISON:  CT 05/26/2013  FINDINGS: Pelvic bony ring is intact. Symmetric appearance of the sacroiliac joints. Again noted are surgical clips in the right groin  region. No gross abnormality to the proximal femurs.  IMPRESSION: No acute bone abnormality to the pelvis.   Electronically Signed   By: Richarda Overlie M.D.   On: 05/18/2015 17:06   Dg Chest Port 1 View  05/18/2015   CLINICAL DATA:  81 year old female with left-sided chest tube.  EXAM: PORTABLE CHEST - 1 VIEW  COMPARISON:  Chest x-ray 05/18/2015.  FINDINGS: Interval placement of left-sided chest tube with tip in position in the mid left hemithorax. Side port projects over the mid left hemithorax as well. Multiple left-sided rib fractures are again noted (posterior aspects of the left third, fourth, fifth and sixth ribs). Previously demonstrated left-sided pneumothorax appears decreased in size, now all likely less than 10% of the left hemithorax. Known small left pleural effusion is poorly demonstrated on this plain film examination. Opacities at the left base  favored to reflect subsegmental atelectasis. Right lung appears clear. Pulmonary venous congestion, without frank pulmonary edema. Mild cardiomegaly. The patient is rotated to the right on today's exam, resulting in distortion of the mediastinal contours and reduced diagnostic sensitivity and specificity for mediastinal pathology. Atherosclerosis in the thoracic aorta. Emphysema in the left chest wall.  IMPRESSION: 1. Interval placement of left-sided chest tube which appears appropriately located, with decreased size of small left hydropneumothorax. 2. Mild cardiomegaly with pulmonary venous congestion, but no frank pulmonary edema. 3. Atherosclerosis.   Electronically Signed   By: Trudie Reed M.D.   On: 05/18/2015 20:15   Dg Chest Port 1 View  05/18/2015   CLINICAL DATA:  Status post fall from a standing position. Left chest pain.  EXAM: PORTABLE CHEST - 1 VIEW  COMPARISON:  PA and lateral chest 04/22/2015.  FINDINGS: The patient has a left pneumothorax with the apex of the left long at the level of the posterior arc of the left fourth rib. Gas is seen dissecting into the soft tissues of the left neck and along the upper left chest wall. The lungs are clear. There are acute fractures of the left fourth through seventh ribs. The right lung is expanded and clear. Heart size is normal.  IMPRESSION: Small left pneumothorax estimated at 20%.  Left fourth through seventh rib fractures.  Gas in the soft tissues of the left side of the neck likely represents gas dissecting from the left chest wall but could be due to pneumomediastinum.  Critical Value/emergent results were called by telephone at the time of interpretation on 05/18/2015 at 5:06 pm to Dr. Carmell Austria, MD, who verbally acknowledged these results.   Electronically Signed   By: Drusilla Kanner M.D.   On: 05/18/2015 17:10   I, Lyndal Pulley, personally reviewed and evaluated these images and lab results as part of my medical decision-making.   EKG  Interpretation   Date/Time:  Thursday May 18 2015 16:41:50 EDT Ventricular Rate:  75 PR Interval:  172 QRS Duration: 79 QT Interval:  393 QTC Calculation: 439 R Axis:   62 Text Interpretation:  Sinus rhythm Low voltage, precordial leads Normal  ECG Confirmed by Maisa Bedingfield MD, Reuel Boom ([id number]) on 05/18/2015 4:45:22 PM      MDM   Final diagnoses:  Traumatic fracture of ribs with pneumothorax, left, closed, initial encounter  Fall from standing, initial encounter  Hx of cerebral infarction   81 year old female with history of remote left body stroke and persistent lower shoulder many weakness presents after a fall from standing where she tried to get out of bed today. She does not remember  the details of the event boat was able to get back in bed on her own. Screening chest x-ray is significant for left-sided rib fractures and pneumothorax. CT scans of the head, neck, chest, abdomen and pelvis were ordered for full trauma evaluation given the patient's frail disposition and age.  Trauma surgery was consulted to evaluate the patient for chest tube placement and admission for pulmonary toilet. They saw the patient in the emergency department and she was admitted with no complications.    Lyndal Pulley, MD 05/19/15 [phone]

## 2015-05-18 NOTE — ED Notes (Signed)
Fall from standing position mechanical . C/o pain under left scapula and sob. Per ems pt 92% RA.

## 2015-05-18 NOTE — ED Notes (Signed)
Patient transported to CT 

## 2015-05-18 NOTE — ED Notes (Signed)
Fentanyl 50 mcg given prior to chest tube insertion & Fentanyl 50 mcg given after completion of chest tube insertion - per verbal order of Dr. Donell Beers. Administration witnessed by Lesli Albee, RN.

## 2015-05-18 NOTE — H&P (Signed)
History   Gabriela Patel is an 81 y.o. female.   Chief Complaint:  Chief Complaint  Patient presents with  . Fall  . Shortness of Breath    Fall This is a new problem. The current episode started today. Associated symptoms include chest pain. Nothing aggravates the symptoms.  Shortness of Breath Associated symptoms: chest pain   Pt is s/p CVA with mild residual right sided weakness.  She had an unwitnessed fall, and was able to get back into her chair by herself.  She had chest pain and shortness of breath after fall.  This has improved.  She did not really remember why she fell when her family and ED doc asked her.  She denies headache or abdominal pain. She did not pass out.   She is not on any blood thinners other than baby aspirin.  She lives in a sort of independent studio apartment where some help comes a little, but not really an assisted living facility.    She also fell around 1 month ago and came to the ED, but did not have to be admitted.    Past Medical History  Diagnosis Date  . Hypertension   . Osteoporosis   . CVA (cerebral vascular accident) 11/01/2009  . Osteoarthritis   . Hyperlipidemia   . Fatigue     related to statin?  . Bladder incontinence     Past Surgical History  Procedure Laterality Date  . Abdominal hysterectomy    . Cataract extraction      Left  . Vein ligation      Right Leg    Family History  Problem Relation Age of Onset  . Coronary artery disease    . Hip fracture Mother     B/L  . Heart attack Father   . Heart disease Brother    Social History:  reports that she has never smoked. She has never used smokeless tobacco. She reports that she does not drink alcohol or use illicit drugs.  Allergies  No Known Allergies  Home Medications   Medications Outpatient Medications (8) Hospital Medications (2) Clinic-Administered Medications (0)   New medications from outside sources are available for reconciliation   aspirin 81 MG tablet      calcium-vitamin D (OSCAL WITH D) 500-200 MG-UNIT per tablet    citalopram (CELEXA) 10 MG tablet    ibuprofen (ADVIL,MOTRIN) 200 MG tablet    losartan (COZAAR) 25 MG tablet    Multiple Vitamin (MULTIVITAMIN WITH MINERALS) TABS    oxybutynin (DITROPAN-XL) 5 MG 24 hr tablet    pravastatin (PRAVACHOL) 20 MG tablet          Trauma Course   Results for orders placed or performed during the hospital encounter of 05/18/15 (from the past 48 hour(s))  CBC with Differential/Platelet     Status: Abnormal   Collection Time: 05/18/15  5:25 PM  Result Value Ref Range   WBC 12.0 (H) 4.0 - 10.5 K/uL   RBC 4.21 3.87 - 5.11 MIL/uL   Hemoglobin 13.1 12.0 - 15.0 g/dL   HCT 38.8 36.0 - 46.0 %   MCV 92.2 78.0 - 100.0 fL   MCH 31.1 26.0 - 34.0 pg   MCHC 33.8 30.0 - 36.0 g/dL   RDW 13.8 11.5 - 15.5 %   Platelets 189 150 - 400 K/uL   Neutrophils Relative % 87 (H) 43 - 77 %   Neutro Abs 10.3 (H) 1.7 - 7.7 K/uL   Lymphocytes Relative 7 (  L) 12 - 46 %   Lymphs Abs 0.8 0.7 - 4.0 K/uL   Monocytes Relative 6 3 - 12 %   Monocytes Absolute 0.7 0.1 - 1.0 K/uL   Eosinophils Relative 0 0 - 5 %   Eosinophils Absolute 0.0 0.0 - 0.7 K/uL   Basophils Relative 0 0 - 1 %   Basophils Absolute 0.0 0.0 - 0.1 K/uL  Basic metabolic panel     Status: Abnormal   Collection Time: 05/18/15  5:25 PM  Result Value Ref Range   Sodium 131 (L) 135 - 145 mmol/L   Potassium 3.7 3.5 - 5.1 mmol/L   Chloride 96 (L) 101 - 111 mmol/L   CO2 24 22 - 32 mmol/L   Glucose, Bld 131 (H) 65 - 99 mg/dL   BUN 6 6 - 20 mg/dL   Creatinine, Ser 0.72 0.44 - 1.00 mg/dL   Calcium 8.7 (L) 8.9 - 10.3 mg/dL   GFR calc non Af Amer >60 >60 mL/min   GFR calc Af Amer >60 >60 mL/min    Comment: (NOTE) The eGFR has been calculated using the CKD EPI equation. This calculation has not been validated in all clinical situations. eGFR's persistently <60 mL/min signify possible Chronic Kidney Disease.    Anion gap 11 5 - 15   Dg Ankle Complete  Left  05/18/2015   CLINICAL DATA:  Status post fall today. Mild left ankle swelling. Pain. Initial encounter.  EXAM: LEFT ANKLE COMPLETE - 3+ VIEW  COMPARISON:  None.  FINDINGS: Imaged bones, joints and soft tissues appear normal.  IMPRESSION: Negative exam.   Electronically Signed   By: Inge Rise M.D.   On: 05/18/2015 18:02   Ct Head Wo Contrast  05/18/2015   CLINICAL DATA:  Fall from standing position mechanical . C/o pain under left scapula, lower back pain and sob  EXAM: CT HEAD WITHOUT CONTRAST  CT CERVICAL SPINE WITHOUT CONTRAST  TECHNIQUE: Multidetector CT imaging of the head and cervical spine was performed following the standard protocol without intravenous contrast. Multiplanar CT image reconstructions of the cervical spine were also generated.  COMPARISON:  04/22/2015  FINDINGS: CT HEAD FINDINGS  Ventricles are normal configuration. There is ventricular and sulcal enlargement reflecting mild generalized atrophy. There are no parenchymal masses or mass effect. There is no evidence of a cortical infarct. Patchy areas of white matter hypoattenuation noted consistent with moderate chronic microvascular ischemic change. Possible old lacune infarct in the left pons.  There are no extra-axial masses or abnormal fluid collections.  There is no intracranial hemorrhage.  Sinuses and mastoid air cells are essentially clear. No skull fracture.  There is soft tissue air below the left temporal bone posterior to the temporomandibular joint and extending along the left posterior upper neck. The etiology of this is unclear.  CT CERVICAL SPINE FINDINGS  Soft tissue air is seen along the posterior and left lateral aspect of the neck extending to the left supraclavicular region. The etiology of this is from the left pneumothorax.  No cervical spine fracture. No spondylolisthesis. Bones are demineralized. Facet degenerative change, mild, as noted on the right at C 7-T1. There is no significant central stenosis.  Uncovertebral spurring leads to mild left neural foraminal narrowing at C5-C6. Facet spurring leads to mild neural foraminal narrowing on the right at C7-T1.  There is a moderate left pneumothorax. A minimally displaced fracture the posterior left third rib is noted.  IMPRESSION: HEAD CT:  No acute intracranial abnormalities.  No  skull fracture.  CERVICAL CT: No cervical spine fracture or spondylolisthesis. Mildly displaced left posterior third rib fracture. Moderate left pneumothorax with soft tissue air extending along the left posterior and lateral neck.   Electronically Signed   By: Lajean Manes M.D.   On: 05/18/2015 19:22   Ct Chest W Contrast  05/18/2015   CLINICAL DATA:  81 year old female with history of trauma from a fall from a standing position. Pain underneath the left scapula and in the lower back. Shortness of breath.  EXAM: CT CHEST, ABDOMEN, AND PELVIS WITH CONTRAST  TECHNIQUE: Multidetector CT imaging of the chest, abdomen and pelvis was performed following the standard protocol during bolus administration of intravenous contrast.  CONTRAST:  137m OMNIPAQUE IOHEXOL 300 MG/ML  SOLN  COMPARISON:  No priors.  FINDINGS: CT CHEST FINDINGS  Mediastinum/Lymph Nodes: No abnormal high attenuation fluid within the mediastinum to suggest posttraumatic mediastinal hematoma. No evidence of posttraumatic aortic dissection/transection. Heart size is normal. There is no significant pericardial fluid, thickening or pericardial calcification. There is atherosclerosis of the thoracic aorta, the great vessels of the mediastinum and the coronary arteries, including calcified atherosclerotic plaque in the left main, left anterior descending and right coronary arteries. No pathologically enlarged mediastinal or hilar lymph nodes. Esophagus is unremarkable in appearance. No axillary lymphadenopathy.  Lungs/Pleura: Moderate left-sided pneumothorax occupying approximately 30% of the volume of the left hemithorax. Small  volume of intermediate attenuation fluid in the left hemithorax, likely minimally proteinaceous/hemorrhagic. Passive atelectasis in portions of the left upper and left lower lobes dependently. Small amount of atelectasis or scarring in the basal portion of the right lower lobe dependently.  Musculoskeletal/Soft Tissues: Acute minimally displaced fractures of the left third, fourth, fifth and sixth ribs posteriorly are noted. There is emphysema in the adjacent fat and muscular tissues of the left chest wall both anteriorly and posteriorly. Multiple old chronic compression fractures are noted, most evident at T7, T8 and T10, most severe at T10 where there is 50% loss of anterior vertebral body height.  CT ABDOMEN AND PELVIS FINDINGS  Hepatobiliary: No signs of acute traumatic injury to the liver. No cystic or solid hepatic lesions. Minimal intrahepatic biliary ductal dilatation. Common bile duct measures 10 mm in the porta hepatis. Possible noncalcified stone in the distal aspect of the common bile duct best appreciated at the transition from image [id number]. Calcified gallstone in the dependent portion of the gallbladder. Gallbladder does not appear distended, and there is no gallbladder wall thickening or pericholecystic fluid to suggest an acute cholecystitis at this time.  Pancreas: No pancreatic mass. Pancreatic duct is upper limits of normal in size measuring 3 mm in the pancreatic head. No pancreatic or peripancreatic fluid or inflammatory changes.  Spleen: No findings to suggest acute traumatic injury to the spleen.  Adrenals/Urinary Tract: Multiple low-attenuation lesions in the kidneys bilaterally, compatible with simple cysts, largest of which is exophytic extending off the posterior aspect of the upper pole of the left kidney measuring up to 2.9 cm. No hydroureteronephrosis. Urinary bladder is normal in appearance.  Stomach/Bowel: No signs of acute traumatic injury to the stomach, small bowel or colon. Normal  appearance of the stomach. No pathologic dilatation of small bowel or colon. Numerous colonic diverticulae are noted, without surrounding inflammatory changes to suggest an acute diverticulitis at this time.  Vascular/Lymphatic: No evidence of acute traumatic injury to the major arteries or veins of the abdomen and pelvis. Atherosclerosis throughout the abdominal and pelvic vasculature, without evidence of aneurysm  or dissection. No lymphadenopathy noted in the abdomen or pelvis.  Reproductive: Status post hysterectomy. Ovaries are not confidently identified may be surgically absent or atrophic.  Other: No high attenuation fluid collection within the peritoneal cavity or retroperitoneum to suggest posttraumatic hemorrhage. No significant volume of ascites. No pneumoperitoneum.  Musculoskeletal: Surgical clips in the right groin, likely from prior vascular access. No acute displaced fractures in the abdomen or pelvis. No aggressive appearing lytic or blastic lesions noted in the visualized portions of the axial and appendicular skeleton. Chronic appearing compression fracture of superior endplate of L1 and L3 are noted.  IMPRESSION: 1. Multiple minimally displaced posterior left-sided rib fractures involving the left third, fourth, fifth and sixth ribs, with moderate left hydropneumothorax (pleural fluid in this collection is mildly proteinaceous/hemorrhagic, but not a frank hemothorax). There is extensive gas in the subcutaneous fat and deep musculature of the left chest wall. 2. This is associated with some passive atelectasis in the left upper and left lower lobes. 3. No other signs of significant acute traumatic injury to the abdomen or pelvis. 4. There is mild intra and extrahepatic biliary ductal dilatation, and there appears to potentially be a noncalcified stone in the distal common bile duct. If there is any clinical concern for potential biliary tract obstruction, nonemergent follow-up evaluation with MRI  of the abdomen with and without IV gadolinium with MRCP could be performed in the near future; however, this is best performed when the patient is clinically stable and able to reliably hold her breath during the examination. 5. Atherosclerosis, including left main and 2 vessel coronary artery disease. 6. Colonic diverticulosis without evidence of acute diverticulitis at this time. 7. Additional incidental findings, as above. These results were called by telephone at the time of interpretation on 05/18/2015 at 7:36 pm to Dr. Leo Grosser, who verbally acknowledged these results.   Electronically Signed   By: Vinnie Langton M.D.   On: 05/18/2015 19:38   Ct Cervical Spine Wo Contrast  05/18/2015   CLINICAL DATA:  Fall from standing position mechanical . C/o pain under left scapula, lower back pain and sob  EXAM: CT HEAD WITHOUT CONTRAST  CT CERVICAL SPINE WITHOUT CONTRAST  TECHNIQUE: Multidetector CT imaging of the head and cervical spine was performed following the standard protocol without intravenous contrast. Multiplanar CT image reconstructions of the cervical spine were also generated.  COMPARISON:  04/22/2015  FINDINGS: CT HEAD FINDINGS  Ventricles are normal configuration. There is ventricular and sulcal enlargement reflecting mild generalized atrophy. There are no parenchymal masses or mass effect. There is no evidence of a cortical infarct. Patchy areas of white matter hypoattenuation noted consistent with moderate chronic microvascular ischemic change. Possible old lacune infarct in the left pons.  There are no extra-axial masses or abnormal fluid collections.  There is no intracranial hemorrhage.  Sinuses and mastoid air cells are essentially clear. No skull fracture.  There is soft tissue air below the left temporal bone posterior to the temporomandibular joint and extending along the left posterior upper neck. The etiology of this is unclear.  CT CERVICAL SPINE FINDINGS  Soft tissue air is seen along  the posterior and left lateral aspect of the neck extending to the left supraclavicular region. The etiology of this is from the left pneumothorax.  No cervical spine fracture. No spondylolisthesis. Bones are demineralized. Facet degenerative change, mild, as noted on the right at C 7-T1. There is no significant central stenosis. Uncovertebral spurring leads to mild left neural foraminal  narrowing at C5-C6. Facet spurring leads to mild neural foraminal narrowing on the right at C7-T1.  There is a moderate left pneumothorax. A minimally displaced fracture the posterior left third rib is noted.  IMPRESSION: HEAD CT:  No acute intracranial abnormalities.  No skull fracture.  CERVICAL CT: No cervical spine fracture or spondylolisthesis. Mildly displaced left posterior third rib fracture. Moderate left pneumothorax with soft tissue air extending along the left posterior and lateral neck.   Electronically Signed   By: Lajean Manes M.D.   On: 05/18/2015 19:22   Ct Abdomen Pelvis W Contrast  05/18/2015   CLINICAL DATA:  81 year old female with history of trauma from a fall from a standing position. Pain underneath the left scapula and in the lower back. Shortness of breath.  EXAM: CT CHEST, ABDOMEN, AND PELVIS WITH CONTRAST  TECHNIQUE: Multidetector CT imaging of the chest, abdomen and pelvis was performed following the standard protocol during bolus administration of intravenous contrast.  CONTRAST:  152m OMNIPAQUE IOHEXOL 300 MG/ML  SOLN  COMPARISON:  No priors.  FINDINGS: CT CHEST FINDINGS  Mediastinum/Lymph Nodes: No abnormal high attenuation fluid within the mediastinum to suggest posttraumatic mediastinal hematoma. No evidence of posttraumatic aortic dissection/transection. Heart size is normal. There is no significant pericardial fluid, thickening or pericardial calcification. There is atherosclerosis of the thoracic aorta, the great vessels of the mediastinum and the coronary arteries, including calcified  atherosclerotic plaque in the left main, left anterior descending and right coronary arteries. No pathologically enlarged mediastinal or hilar lymph nodes. Esophagus is unremarkable in appearance. No axillary lymphadenopathy.  Lungs/Pleura: Moderate left-sided pneumothorax occupying approximately 30% of the volume of the left hemithorax. Small volume of intermediate attenuation fluid in the left hemithorax, likely minimally proteinaceous/hemorrhagic. Passive atelectasis in portions of the left upper and left lower lobes dependently. Small amount of atelectasis or scarring in the basal portion of the right lower lobe dependently.  Musculoskeletal/Soft Tissues: Acute minimally displaced fractures of the left third, fourth, fifth and sixth ribs posteriorly are noted. There is emphysema in the adjacent fat and muscular tissues of the left chest wall both anteriorly and posteriorly. Multiple old chronic compression fractures are noted, most evident at T7, T8 and T10, most severe at T10 where there is 50% loss of anterior vertebral body height.  CT ABDOMEN AND PELVIS FINDINGS  Hepatobiliary: No signs of acute traumatic injury to the liver. No cystic or solid hepatic lesions. Minimal intrahepatic biliary ductal dilatation. Common bile duct measures 10 mm in the porta hepatis. Possible noncalcified stone in the distal aspect of the common bile duct best appreciated at the transition from image [id number]. Calcified gallstone in the dependent portion of the gallbladder. Gallbladder does not appear distended, and there is no gallbladder wall thickening or pericholecystic fluid to suggest an acute cholecystitis at this time.  Pancreas: No pancreatic mass. Pancreatic duct is upper limits of normal in size measuring 3 mm in the pancreatic head. No pancreatic or peripancreatic fluid or inflammatory changes.  Spleen: No findings to suggest acute traumatic injury to the spleen.  Adrenals/Urinary Tract: Multiple low-attenuation lesions  in the kidneys bilaterally, compatible with simple cysts, largest of which is exophytic extending off the posterior aspect of the upper pole of the left kidney measuring up to 2.9 cm. No hydroureteronephrosis. Urinary bladder is normal in appearance.  Stomach/Bowel: No signs of acute traumatic injury to the stomach, small bowel or colon. Normal appearance of the stomach. No pathologic dilatation of small bowel or colon. Numerous  colonic diverticulae are noted, without surrounding inflammatory changes to suggest an acute diverticulitis at this time.  Vascular/Lymphatic: No evidence of acute traumatic injury to the major arteries or veins of the abdomen and pelvis. Atherosclerosis throughout the abdominal and pelvic vasculature, without evidence of aneurysm or dissection. No lymphadenopathy noted in the abdomen or pelvis.  Reproductive: Status post hysterectomy. Ovaries are not confidently identified may be surgically absent or atrophic.  Other: No high attenuation fluid collection within the peritoneal cavity or retroperitoneum to suggest posttraumatic hemorrhage. No significant volume of ascites. No pneumoperitoneum.  Musculoskeletal: Surgical clips in the right groin, likely from prior vascular access. No acute displaced fractures in the abdomen or pelvis. No aggressive appearing lytic or blastic lesions noted in the visualized portions of the axial and appendicular skeleton. Chronic appearing compression fracture of superior endplate of L1 and L3 are noted.  IMPRESSION: 1. Multiple minimally displaced posterior left-sided rib fractures involving the left third, fourth, fifth and sixth ribs, with moderate left hydropneumothorax (pleural fluid in this collection is mildly proteinaceous/hemorrhagic, but not a frank hemothorax). There is extensive gas in the subcutaneous fat and deep musculature of the left chest wall. 2. This is associated with some passive atelectasis in the left upper and left lower lobes. 3. No  other signs of significant acute traumatic injury to the abdomen or pelvis. 4. There is mild intra and extrahepatic biliary ductal dilatation, and there appears to potentially be a noncalcified stone in the distal common bile duct. If there is any clinical concern for potential biliary tract obstruction, nonemergent follow-up evaluation with MRI of the abdomen with and without IV gadolinium with MRCP could be performed in the near future; however, this is best performed when the patient is clinically stable and able to reliably hold her breath during the examination. 5. Atherosclerosis, including left main and 2 vessel coronary artery disease. 6. Colonic diverticulosis without evidence of acute diverticulitis at this time. 7. Additional incidental findings, as above. These results were called by telephone at the time of interpretation on 05/18/2015 at 7:36 pm to Dr. Leo Grosser, who verbally acknowledged these results.   Electronically Signed   By: Vinnie Langton M.D.   On: 05/18/2015 19:38   Dg Pelvis Portable  05/18/2015   CLINICAL DATA:  Trauma.  Recent fall.  EXAM: PORTABLE PELVIS 1-2 VIEWS  COMPARISON:  CT 05/26/2013  FINDINGS: Pelvic bony ring is intact. Symmetric appearance of the sacroiliac joints. Again noted are surgical clips in the right groin region. No gross abnormality to the proximal femurs.  IMPRESSION: No acute bone abnormality to the pelvis.   Electronically Signed   By: Markus Daft M.D.   On: 05/18/2015 17:06   Dg Chest Port 1 View  05/18/2015   CLINICAL DATA:  81 year old female with left-sided chest tube.  EXAM: PORTABLE CHEST - 1 VIEW  COMPARISON:  Chest x-ray 05/18/2015.  FINDINGS: Interval placement of left-sided chest tube with tip in position in the mid left hemithorax. Side port projects over the mid left hemithorax as well. Multiple left-sided rib fractures are again noted (posterior aspects of the left third, fourth, fifth and sixth ribs). Previously demonstrated left-sided  pneumothorax appears decreased in size, now all likely less than 10% of the left hemithorax. Known small left pleural effusion is poorly demonstrated on this plain film examination. Opacities at the left base favored to reflect subsegmental atelectasis. Right lung appears clear. Pulmonary venous congestion, without frank pulmonary edema. Mild cardiomegaly. The patient is rotated to  the right on today's exam, resulting in distortion of the mediastinal contours and reduced diagnostic sensitivity and specificity for mediastinal pathology. Atherosclerosis in the thoracic aorta. Emphysema in the left chest wall.  IMPRESSION: 1. Interval placement of left-sided chest tube which appears appropriately located, with decreased size of small left hydropneumothorax. 2. Mild cardiomegaly with pulmonary venous congestion, but no frank pulmonary edema. 3. Atherosclerosis.   Electronically Signed   By: Vinnie Langton M.D.   On: 05/18/2015 20:15   Dg Chest Port 1 View  05/18/2015   CLINICAL DATA:  Status post fall from a standing position. Left chest pain.  EXAM: PORTABLE CHEST - 1 VIEW  COMPARISON:  PA and lateral chest 04/22/2015.  FINDINGS: The patient has a left pneumothorax with the apex of the left long at the level of the posterior arc of the left fourth rib. Gas is seen dissecting into the soft tissues of the left neck and along the upper left chest wall. The lungs are clear. There are acute fractures of the left fourth through seventh ribs. The right lung is expanded and clear. Heart size is normal.  IMPRESSION: Small left pneumothorax estimated at 20%.  Left fourth through seventh rib fractures.  Gas in the soft tissues of the left side of the neck likely represents gas dissecting from the left chest wall but could be due to pneumomediastinum.  Critical Value/emergent results were called by telephone at the time of interpretation on 05/18/2015 at 5:06 pm to Dr. Delphina Cahill, MD, who verbally acknowledged these  results.   Electronically Signed   By: Inge Rise M.D.   On: 05/18/2015 17:10    Review of Systems  Constitutional: Negative.   HENT: Negative.   Eyes: Negative.   Respiratory: Positive for shortness of breath.   Cardiovascular: Positive for chest pain.  Gastrointestinal: Negative.   Genitourinary: Negative.   Skin: Negative.   Endo/Heme/Allergies: Negative.   Psychiatric/Behavioral: Negative.     Blood pressure 157/77, pulse 99, temperature 98.3 F (36.8 C), temperature source Oral, resp. rate 22, SpO2 95 %. Physical Exam  Constitutional: She is oriented to person, place, and time. She appears well-developed and well-nourished. No distress.  HENT:  Head: Normocephalic and atraumatic.  Right Ear: External ear normal.  Left Ear: External ear normal.  Mouth/Throat: No oropharyngeal exudate.  Eyes: Conjunctivae are normal. Pupils are equal, round, and reactive to light. No scleral icterus.  Neck: Normal range of motion. Neck supple. No tracheal deviation present. No thyromegaly present.  Cardiovascular: Normal rate, regular rhythm and intact distal pulses.   Respiratory: Effort normal. No respiratory distress. She exhibits tenderness.  GI: Soft. She exhibits no distension. There is no tenderness. There is no rebound and no guarding.  Lymphadenopathy:    She has no cervical adenopathy.  Neurological: She is alert and oriented to person, place, and time.  Skin: Skin is warm and dry. No rash noted. She is not diaphoretic. No erythema. No pallor.  Psychiatric: She has a normal mood and affect. Her behavior is normal. Judgment and thought content normal.     Assessment/Plan Fall Left rib fractures 4-7 Pneumothorax UTI  Admit to stepdown. Chest tube placement Pain control Pulmonary toilet. Antibiotics for UTI.     [place] 05/18/2015, 8:26 PM   Procedures

## 2015-05-19 ENCOUNTER — Encounter (HOSPITAL_COMMUNITY): Payer: Self-pay | Admitting: Certified Registered Nurse Anesthetist

## 2015-05-19 LAB — URINALYSIS, ROUTINE W REFLEX MICROSCOPIC
Bilirubin Urine: NEGATIVE
Glucose, UA: NEGATIVE mg/dL
HGB URINE DIPSTICK: NEGATIVE
Ketones, ur: NEGATIVE mg/dL
Nitrite: POSITIVE — AB
PH: 7 (ref 5.0–8.0)
PROTEIN: NEGATIVE mg/dL
SPECIFIC GRAVITY, URINE: 1.028 (ref 1.005–1.030)
Urobilinogen, UA: 0.2 mg/dL (ref 0.0–1.0)

## 2015-05-19 LAB — URINE MICROSCOPIC-ADD ON

## 2015-05-19 LAB — MRSA PCR SCREENING: MRSA by PCR: NEGATIVE

## 2015-05-19 MED ORDER — LOSARTAN POTASSIUM 25 MG PO TABS
25.0000 mg | ORAL_TABLET | Freq: Two times a day (BID) | ORAL | Status: DC
  Administered 2015-05-19 – 2015-05-23 (×9): 25 mg via ORAL
  Filled 2015-05-19 (×11): qty 1

## 2015-05-19 MED ORDER — CITALOPRAM HYDROBROMIDE 20 MG PO TABS
20.0000 mg | ORAL_TABLET | Freq: Every day | ORAL | Status: DC
  Administered 2015-05-20 – 2015-05-23 (×4): 20 mg via ORAL
  Filled 2015-05-19 (×6): qty 1

## 2015-05-19 MED ORDER — CETYLPYRIDINIUM CHLORIDE 0.05 % MT LIQD
7.0000 mL | Freq: Two times a day (BID) | OROMUCOSAL | Status: DC
  Administered 2015-05-19 – 2015-05-22 (×8): 7 mL via OROMUCOSAL

## 2015-05-19 MED ORDER — ASPIRIN 81 MG PO CHEW
81.0000 mg | CHEWABLE_TABLET | Freq: Every day | ORAL | Status: DC
  Administered 2015-05-20 – 2015-05-23 (×4): 81 mg via ORAL
  Filled 2015-05-19 (×6): qty 1

## 2015-05-19 MED ORDER — CETYLPYRIDINIUM CHLORIDE 0.05 % MT LIQD: 7.0000 mL | Freq: Two times a day (BID) | OROMUCOSAL | Status: DC

## 2015-05-19 MED ORDER — BISACODYL 10 MG RE SUPP: 10.0000 mg | Freq: Every day | RECTAL | Status: DC | PRN

## 2015-05-19 MED ORDER — ACETAMINOPHEN 325 MG PO TABS
650.0000 mg | ORAL_TABLET | ORAL | Status: DC | PRN
  Administered 2015-05-19: 650 mg via ORAL
  Filled 2015-05-19: qty 2

## 2015-05-19 MED ORDER — ASPIRIN 81 MG PO TABS: 81.0000 mg | ORAL_TABLET | Freq: Every morning | ORAL | Status: DC

## 2015-05-19 MED ORDER — ONDANSETRON HCL 4 MG PO TABS: 4.0000 mg | ORAL_TABLET | Freq: Four times a day (QID) | ORAL | Status: DC | PRN

## 2015-05-19 MED ORDER — DOCUSATE SODIUM 100 MG PO CAPS
100.0000 mg | ORAL_CAPSULE | Freq: Two times a day (BID) | ORAL | Status: DC
  Administered 2015-05-19 – 2015-05-22 (×8): 100 mg via ORAL
  Filled 2015-05-19 (×9): qty 1

## 2015-05-19 MED ORDER — CALCIUM CARBONATE-VITAMIN D 500-200 MG-UNIT PO TABS
1.0000 | ORAL_TABLET | Freq: Two times a day (BID) | ORAL | Status: DC
Start: 2015-05-19 — End: 2015-05-23
  Administered 2015-05-19 – 2015-05-23 (×9): 1 via ORAL
  Filled 2015-05-19 (×11): qty 1

## 2015-05-19 MED ORDER — OXYBUTYNIN CHLORIDE ER 5 MG PO TB24
5.0000 mg | ORAL_TABLET | Freq: Every day | ORAL | Status: DC
  Administered 2015-05-20 – 2015-05-23 (×4): 5 mg via ORAL
  Filled 2015-05-19 (×5): qty 1

## 2015-05-19 MED ORDER — ONDANSETRON HCL 4 MG/2ML IJ SOLN
4.0000 mg | Freq: Four times a day (QID) | INTRAMUSCULAR | Status: DC | PRN
  Administered 2015-05-22: 4 mg via INTRAVENOUS

## 2015-05-19 MED ORDER — ADULT MULTIVITAMIN W/MINERALS CH
1.0000 | ORAL_TABLET | Freq: Every morning | ORAL | Status: DC
  Administered 2015-05-21 – 2015-05-23 (×3): 1 via ORAL
  Filled 2015-05-19 (×6): qty 1

## 2015-05-19 MED ORDER — ACETAMINOPHEN 500 MG PO TABS
1000.0000 mg | ORAL_TABLET | Freq: Three times a day (TID) | ORAL | Status: DC
  Administered 2015-05-19 – 2015-05-23 (×12): 1000 mg via ORAL
  Filled 2015-05-19 (×16): qty 2

## 2015-05-19 MED ORDER — HEPARIN SODIUM (PORCINE) 5000 UNIT/ML IJ SOLN
5000.0000 [IU] | Freq: Three times a day (TID) | INTRAMUSCULAR | Status: DC
  Administered 2015-05-19 – 2015-05-23 (×13): 5000 [IU] via SUBCUTANEOUS
  Filled 2015-05-19 (×16): qty 1

## 2015-05-19 MED ORDER — PRAVASTATIN SODIUM 20 MG PO TABS
20.0000 mg | ORAL_TABLET | Freq: Every day | ORAL | Status: DC
  Administered 2015-05-19 – 2015-05-22 (×4): 20 mg via ORAL
  Filled 2015-05-19 (×5): qty 1

## 2015-05-19 MED ORDER — IBUPROFEN 200 MG PO TABS: 400.0000 mg | ORAL_TABLET | Freq: Four times a day (QID) | ORAL | Status: DC | PRN

## 2015-05-19 NOTE — Care Management Note (Signed)
Case Management Note  Patient Details  Name: Gabriela Patel MRN: [id number] Date of Birth: 1927/01/05  Subjective/Objective: Pt admitted on 05/18/15 s/p fall with traumatic pneumothorax.  PTA, pt resides in independent living apt alone.                   Action/Plan: Met with pt, daughter, and pt's sister to discuss discharge plans.  Daughter lives 3 hours away, and is unable to assist with care at dc.  Pt will likely need SNF for rehab at dc.  PT/OT consults are pending.  Will consult CSW to follow up with pt to discuss options.     Expected Discharge Date:                  Expected Discharge Plan:     In-House Referral:   CSW  Discharge planning Services   CM consult  Post Acute Care Choice:    Choice offered to:     DME Arranged:    DME Agency:     HH Arranged:    HH Agency:     Status of Service:   in process, will continue to follow  Medicare Important Message Given:    Date Medicare IM Given:    Medicare IM give by:    Date Additional Medicare IM Given:    Additional Medicare Important Message give by:     If discussed at [place] of Stay Meetings, dates discussed:    Additional Comments:  Reinaldo Raddle, RN, BSN  Trauma/Neuro ICU Case Manager [phone]

## 2015-05-19 NOTE — Progress Notes (Signed)
OT Cancellation Note  Patient Details Name: ELYANA GRABSKI MRN: [id number] DOB: 1927/02/20   Cancelled Treatment:    Reason Eval/Treat Not Completed: Pain limiting ability to participate;Fatigue/lethargy limiting ability to participate.  Will reattempt.   Angelene Giovanni [place], OTR/L [phone]  05/19/2015, 5:11 PM

## 2015-05-19 NOTE — Progress Notes (Signed)
PT Cancellation Note  Patient Details Name: Gabriela Patel MRN: [id number] DOB: May 01, 1927   Cancelled Treatment:    Reason Eval/Treat Not Completed: Pain limiting ability to participate;Fatigue/lethargy limiting ability to participate. Pt has just finished bath with nursing. Nurse tech reports she guards/resists with any movement. Attempted to initiate evaluation and pt begging to be left alone "I've been doing things all morning." Reports severe Lt side and back pain. RN aware.  Daughter arrived and obtained history and prior functional status. Will re-attempt later today as schedule permits or 8/13 (with pre-medication for pain).   Brinton Brandel 05/19/2015, 10:52 AM Pager [phone]

## 2015-05-19 NOTE — Progress Notes (Signed)
Patient ID: Gabriela Patel, female   DOB: 05-01-1927, 81 y.o.   MRN: [id number]  LOS: 1 day   Subjective: On bed pan.  Sore, c/o left back pain.  BP improved.  Denies SOB.     Objective: Vital signs in last 24 hours: Temp:  [98.1 F (36.7 C)-98.3 F (36.8 C)] 98.3 F (36.8 C) (08/12 0408) Pulse Rate:  [64-99] 84 (08/12 0408) Resp:  [15-34] 21 (08/12 0408) BP: (139-201)/(66-98) 149/71 mmHg (08/12 0408) SpO2:  [92 %-100 %] 100 % (08/12 0507) Weight:  [56.8 kg (125 lb 3.5 oz)] 56.8 kg (125 lb 3.5 oz) (08/12 0227)    Lab Results:  CBC  Recent Labs  05/18/15 1725  WBC 12.0*  HGB 13.1  HCT 38.8  PLT 189   BMET  Recent Labs  05/18/15 1725  NA 131*  K 3.7  CL 96*  CO2 24  GLUCOSE 131*  BUN 6  CREATININE 0.72  CALCIUM 8.7*    Imaging: Dg Ankle Complete Left  05/18/2015   CLINICAL DATA:  Status post fall today. Mild left ankle swelling. Pain. Initial encounter.  EXAM: LEFT ANKLE COMPLETE - 3+ VIEW  COMPARISON:  None.  FINDINGS: Imaged bones, joints and soft tissues appear normal.  IMPRESSION: Negative exam.   Electronically Signed   By: Drusilla Kanner M.D.   On: 05/18/2015 18:02   Ct Head Wo Contrast  05/18/2015   CLINICAL DATA:  Fall from standing position mechanical . C/o pain under left scapula, lower back pain and sob  EXAM: CT HEAD WITHOUT CONTRAST  CT CERVICAL SPINE WITHOUT CONTRAST  TECHNIQUE: Multidetector CT imaging of the head and cervical spine was performed following the standard protocol without intravenous contrast. Multiplanar CT image reconstructions of the cervical spine were also generated.  COMPARISON:  04/22/2015  FINDINGS: CT HEAD FINDINGS  Ventricles are normal configuration. There is ventricular and sulcal enlargement reflecting mild generalized atrophy. There are no parenchymal masses or mass effect. There is no evidence of a cortical infarct. Patchy areas of white matter hypoattenuation noted consistent with moderate chronic microvascular ischemic  change. Possible old lacune infarct in the left pons.  There are no extra-axial masses or abnormal fluid collections.  There is no intracranial hemorrhage.  Sinuses and mastoid air cells are essentially clear. No skull fracture.  There is soft tissue air below the left temporal bone posterior to the temporomandibular joint and extending along the left posterior upper neck. The etiology of this is unclear.  CT CERVICAL SPINE FINDINGS  Soft tissue air is seen along the posterior and left lateral aspect of the neck extending to the left supraclavicular region. The etiology of this is from the left pneumothorax.  No cervical spine fracture. No spondylolisthesis. Bones are demineralized. Facet degenerative change, mild, as noted on the right at C 7-T1. There is no significant central stenosis. Uncovertebral spurring leads to mild left neural foraminal narrowing at C5-C6. Facet spurring leads to mild neural foraminal narrowing on the right at C7-T1.  There is a moderate left pneumothorax. A minimally displaced fracture the posterior left third rib is noted.  IMPRESSION: HEAD CT:  No acute intracranial abnormalities.  No skull fracture.  CERVICAL CT: No cervical spine fracture or spondylolisthesis. Mildly displaced left posterior third rib fracture. Moderate left pneumothorax with soft tissue air extending along the left posterior and lateral neck.   Electronically Signed   By: Amie Portland M.D.   On: 05/18/2015 19:22   Ct Chest W Contrast  05/18/2015   CLINICAL DATA:  81 year old female with history of trauma from a fall from a standing position. Pain underneath the left scapula and in the lower back. Shortness of breath.  EXAM: CT CHEST, ABDOMEN, AND PELVIS WITH CONTRAST  TECHNIQUE: Multidetector CT imaging of the chest, abdomen and pelvis was performed following the standard protocol during bolus administration of intravenous contrast.  CONTRAST:  OMNIPAQUE IOHEXOL 300 MG/ML  SOLN  COMPARISON:  No priors.   FINDINGS: CT CHEST FINDINGS  Mediastinum/Lymph Nodes: No abnormal high attenuation fluid within the mediastinum to suggest posttraumatic mediastinal hematoma. No evidence of posttraumatic aortic dissection/transection. Heart size is normal. There is no significant pericardial fluid, thickening or pericardial calcification. There is atherosclerosis of the thoracic aorta, the great vessels of the mediastinum and the coronary arteries, including calcified atherosclerotic plaque in the left main, left anterior descending and right coronary arteries. No pathologically enlarged mediastinal or hilar lymph nodes. Esophagus is unremarkable in appearance. No axillary lymphadenopathy.  Lungs/Pleura: Moderate left-sided pneumothorax occupying approximately 30% of the volume of the left hemithorax. Small volume of intermediate attenuation fluid in the left hemithorax, likely minimally proteinaceous/hemorrhagic. Passive atelectasis in portions of the left upper and left lower lobes dependently. Small amount of atelectasis or scarring in the basal portion of the right lower lobe dependently.  Musculoskeletal/Soft Tissues: Acute minimally displaced fractures of the left third, fourth, fifth and sixth ribs posteriorly are noted. There is emphysema in the adjacent fat and muscular tissues of the left chest wall both anteriorly and posteriorly. Multiple old chronic compression fractures are noted, most evident at T7, T8 and T10, most severe at T10 where there is 50% loss of anterior vertebral body height.  CT ABDOMEN AND PELVIS FINDINGS  Hepatobiliary: No signs of acute traumatic injury to the liver. No cystic or solid hepatic lesions. Minimal intrahepatic biliary ductal dilatation. Common bile duct measures 10 mm in the porta hepatis. Possible noncalcified stone in the distal aspect of the common bile duct best appreciated at the transition from image [id number]. Calcified gallstone in the dependent portion of the gallbladder. Gallbladder  does not appear distended, and there is no gallbladder wall thickening or pericholecystic fluid to suggest an acute cholecystitis at this time.  Pancreas: No pancreatic mass. Pancreatic duct is upper limits of normal in size measuring 3 mm in the pancreatic head. No pancreatic or peripancreatic fluid or inflammatory changes.  Spleen: No findings to suggest acute traumatic injury to the spleen.  Adrenals/Urinary Tract: Multiple low-attenuation lesions in the kidneys bilaterally, compatible with simple cysts, largest of which is exophytic extending off the posterior aspect of the upper pole of the left kidney measuring up to 2.9 cm. No hydroureteronephrosis. Urinary bladder is normal in appearance.  Stomach/Bowel: No signs of acute traumatic injury to the stomach, small bowel or colon. Normal appearance of the stomach. No pathologic dilatation of small bowel or colon. Numerous colonic diverticulae are noted, without surrounding inflammatory changes to suggest an acute diverticulitis at this time.  Vascular/Lymphatic: No evidence of acute traumatic injury to the major arteries or veins of the abdomen and pelvis. Atherosclerosis throughout the abdominal and pelvic vasculature, without evidence of aneurysm or dissection. No lymphadenopathy noted in the abdomen or pelvis.  Reproductive: Status post hysterectomy. Ovaries are not confidently identified may be surgically absent or atrophic.  Other: No high attenuation fluid collection within the peritoneal cavity or retroperitoneum to suggest posttraumatic hemorrhage. No significant volume of ascites. No pneumoperitoneum.  Musculoskeletal: Surgical clips in the  right groin, likely from prior vascular access. No acute displaced fractures in the abdomen or pelvis. No aggressive appearing lytic or blastic lesions noted in the visualized portions of the axial and appendicular skeleton. Chronic appearing compression fracture of superior endplate of L1 and L3 are noted.   IMPRESSION: 1. Multiple minimally displaced posterior left-sided rib fractures involving the left third, fourth, fifth and sixth ribs, with moderate left hydropneumothorax (pleural fluid in this collection is mildly proteinaceous/hemorrhagic, but not a frank hemothorax). There is extensive gas in the subcutaneous fat and deep musculature of the left chest wall. 2. This is associated with some passive atelectasis in the left upper and left lower lobes. 3. No other signs of significant acute traumatic injury to the abdomen or pelvis. 4. There is mild intra and extrahepatic biliary ductal dilatation, and there appears to potentially be a noncalcified stone in the distal common bile duct. If there is any clinical concern for potential biliary tract obstruction, nonemergent follow-up evaluation with MRI of the abdomen with and without IV gadolinium with MRCP could be performed in the near future; however, this is best performed when the patient is clinically stable and able to reliably hold her breath during the examination. 5. Atherosclerosis, including left main and 2 vessel coronary artery disease. 6. Colonic diverticulosis without evidence of acute diverticulitis at this time. 7. Additional incidental findings, as above. These results were called by telephone at the time of interpretation on 05/18/2015 at 7:36 pm to Dr. Lyndal Pulley, who verbally acknowledged these results.   Electronically Signed   By: Trudie Reed M.D.   On: 05/18/2015 19:38   Ct Cervical Spine Wo Contrast  05/18/2015   CLINICAL DATA:  Fall from standing position mechanical . C/o pain under left scapula, lower back pain and sob  EXAM: CT HEAD WITHOUT CONTRAST  CT CERVICAL SPINE WITHOUT CONTRAST  TECHNIQUE: Multidetector CT imaging of the head and cervical spine was performed following the standard protocol without intravenous contrast. Multiplanar CT image reconstructions of the cervical spine were also generated.  COMPARISON:  04/22/2015   FINDINGS: CT HEAD FINDINGS  Ventricles are normal configuration. There is ventricular and sulcal enlargement reflecting mild generalized atrophy. There are no parenchymal masses or mass effect. There is no evidence of a cortical infarct. Patchy areas of white matter hypoattenuation noted consistent with moderate chronic microvascular ischemic change. Possible old lacune infarct in the left pons.  There are no extra-axial masses or abnormal fluid collections.  There is no intracranial hemorrhage.  Sinuses and mastoid air cells are essentially clear. No skull fracture.  There is soft tissue air below the left temporal bone posterior to the temporomandibular joint and extending along the left posterior upper neck. The etiology of this is unclear.  CT CERVICAL SPINE FINDINGS  Soft tissue air is seen along the posterior and left lateral aspect of the neck extending to the left supraclavicular region. The etiology of this is from the left pneumothorax.  No cervical spine fracture. No spondylolisthesis. Bones are demineralized. Facet degenerative change, mild, as noted on the right at C 7-T1. There is no significant central stenosis. Uncovertebral spurring leads to mild left neural foraminal narrowing at C5-C6. Facet spurring leads to mild neural foraminal narrowing on the right at C7-T1.  There is a moderate left pneumothorax. A minimally displaced fracture the posterior left third rib is noted.  IMPRESSION: HEAD CT:  No acute intracranial abnormalities.  No skull fracture.  CERVICAL CT: No cervical spine fracture or spondylolisthesis.  Mildly displaced left posterior third rib fracture. Moderate left pneumothorax with soft tissue air extending along the left posterior and lateral neck.   Electronically Signed   By: Amie Portland M.D.   On: 05/18/2015 19:22   Ct Abdomen Pelvis W Contrast  05/18/2015   CLINICAL DATA:  81 year old female with history of trauma from a fall from a standing position. Pain underneath the left  scapula and in the lower back. Shortness of breath.  EXAM: CT CHEST, ABDOMEN, AND PELVIS WITH CONTRAST  TECHNIQUE: Multidetector CT imaging of the chest, abdomen and pelvis was performed following the standard protocol during bolus administration of intravenous contrast.  CONTRAST:  OMNIPAQUE IOHEXOL 300 MG/ML  SOLN  COMPARISON:  No priors.  FINDINGS: CT CHEST FINDINGS  Mediastinum/Lymph Nodes: No abnormal high attenuation fluid within the mediastinum to suggest posttraumatic mediastinal hematoma. No evidence of posttraumatic aortic dissection/transection. Heart size is normal. There is no significant pericardial fluid, thickening or pericardial calcification. There is atherosclerosis of the thoracic aorta, the great vessels of the mediastinum and the coronary arteries, including calcified atherosclerotic plaque in the left main, left anterior descending and right coronary arteries. No pathologically enlarged mediastinal or hilar lymph nodes. Esophagus is unremarkable in appearance. No axillary lymphadenopathy.  Lungs/Pleura: Moderate left-sided pneumothorax occupying approximately 30% of the volume of the left hemithorax. Small volume of intermediate attenuation fluid in the left hemithorax, likely minimally proteinaceous/hemorrhagic. Passive atelectasis in portions of the left upper and left lower lobes dependently. Small amount of atelectasis or scarring in the basal portion of the right lower lobe dependently.  Musculoskeletal/Soft Tissues: Acute minimally displaced fractures of the left third, fourth, fifth and sixth ribs posteriorly are noted. There is emphysema in the adjacent fat and muscular tissues of the left chest wall both anteriorly and posteriorly. Multiple old chronic compression fractures are noted, most evident at T7, T8 and T10, most severe at T10 where there is 50% loss of anterior vertebral body height.  CT ABDOMEN AND PELVIS FINDINGS  Hepatobiliary: No signs of acute traumatic injury to  the liver. No cystic or solid hepatic lesions. Minimal intrahepatic biliary ductal dilatation. Common bile duct measures 10 mm in the porta hepatis. Possible noncalcified stone in the distal aspect of the common bile duct best appreciated at the transition from image 62-63. Calcified gallstone in the dependent portion of the gallbladder. Gallbladder does not appear distended, and there is no gallbladder wall thickening or pericholecystic fluid to suggest an acute cholecystitis at this time.  Pancreas: No pancreatic mass. Pancreatic duct is upper limits of normal in size measuring 3 mm in the pancreatic head. No pancreatic or peripancreatic fluid or inflammatory changes.  Spleen: No findings to suggest acute traumatic injury to the spleen.  Adrenals/Urinary Tract: Multiple low-attenuation lesions in the kidneys bilaterally, compatible with simple cysts, largest of which is exophytic extending off the posterior aspect of the upper pole of the left kidney measuring up to 2.9 cm. No hydroureteronephrosis. Urinary bladder is normal in appearance.  Stomach/Bowel: No signs of acute traumatic injury to the stomach, small bowel or colon. Normal appearance of the stomach. No pathologic dilatation of small bowel or colon. Numerous colonic diverticulae are noted, without surrounding inflammatory changes to suggest an acute diverticulitis at this time.  Vascular/Lymphatic: No evidence of acute traumatic injury to the major arteries or veins of the abdomen and pelvis. Atherosclerosis throughout the abdominal and pelvic vasculature, without evidence of aneurysm or dissection. No lymphadenopathy noted in the abdomen or pelvis.  Reproductive: Status post hysterectomy. Ovaries are not confidently identified may be surgically absent or atrophic.  Other: No high attenuation fluid collection within the peritoneal cavity or retroperitoneum to suggest posttraumatic hemorrhage. No significant volume of ascites. No pneumoperitoneum.   Musculoskeletal: Surgical clips in the right groin, likely from prior vascular access. No acute displaced fractures in the abdomen or pelvis. No aggressive appearing lytic or blastic lesions noted in the visualized portions of the axial and appendicular skeleton. Chronic appearing compression fracture of superior endplate of L1 and L3 are noted.  IMPRESSION: 1. Multiple minimally displaced posterior left-sided rib fractures involving the left third, fourth, fifth and sixth ribs, with moderate left hydropneumothorax (pleural fluid in this collection is mildly proteinaceous/hemorrhagic, but not a frank hemothorax). There is extensive gas in the subcutaneous fat and deep musculature of the left chest wall. 2. This is associated with some passive atelectasis in the left upper and left lower lobes. 3. No other signs of significant acute traumatic injury to the abdomen or pelvis. 4. There is mild intra and extrahepatic biliary ductal dilatation, and there appears to potentially be a noncalcified stone in the distal common bile duct. If there is any clinical concern for potential biliary tract obstruction, nonemergent follow-up evaluation with MRI of the abdomen with and without IV gadolinium with MRCP could be performed in the near future; however, this is best performed when the patient is clinically stable and able to reliably hold her breath during the examination. 5. Atherosclerosis, including left main and 2 vessel coronary artery disease. 6. Colonic diverticulosis without evidence of acute diverticulitis at this time. 7. Additional incidental findings, as above. These results were called by telephone at the time of interpretation on 05/18/2015 at 7:36 pm to Dr. Lyndal Pulley, who verbally acknowledged these results.   Electronically Signed   By: Trudie Reed M.D.   On: 05/18/2015 19:38   Dg Pelvis Portable  05/18/2015   CLINICAL DATA:  Trauma.  Recent fall.  EXAM: PORTABLE PELVIS 1-2 VIEWS  COMPARISON:  CT  05/26/2013  FINDINGS: Pelvic bony ring is intact. Symmetric appearance of the sacroiliac joints. Again noted are surgical clips in the right groin region. No gross abnormality to the proximal femurs.  IMPRESSION: No acute bone abnormality to the pelvis.   Electronically Signed   By: Richarda Overlie M.D.   On: 05/18/2015 17:06   Dg Chest Port 1 View  05/18/2015   CLINICAL DATA:  81 year old female with left-sided chest tube.  EXAM: PORTABLE CHEST - 1 VIEW  COMPARISON:  Chest x-ray 05/18/2015.  FINDINGS: Interval placement of left-sided chest tube with tip in position in the mid left hemithorax. Side port projects over the mid left hemithorax as well. Multiple left-sided rib fractures are again noted (posterior aspects of the left third, fourth, fifth and sixth ribs). Previously demonstrated left-sided pneumothorax appears decreased in size, now all likely less than 10% of the left hemithorax. Known small left pleural effusion is poorly demonstrated on this plain film examination. Opacities at the left base favored to reflect subsegmental atelectasis. Right lung appears clear. Pulmonary venous congestion, without frank pulmonary edema. Mild cardiomegaly. The patient is rotated to the right on today's exam, resulting in distortion of the mediastinal contours and reduced diagnostic sensitivity and specificity for mediastinal pathology. Atherosclerosis in the thoracic aorta. Emphysema in the left chest wall.  IMPRESSION: 1. Interval placement of left-sided chest tube which appears appropriately located, with decreased size of small left hydropneumothorax. 2. Mild cardiomegaly with pulmonary  venous congestion, but no frank pulmonary edema. 3. Atherosclerosis.   Electronically Signed   By: Trudie Reed M.D.   On: 05/18/2015 20:15   Dg Chest Port 1 View  05/18/2015   CLINICAL DATA:  Status post fall from a standing position. Left chest pain.  EXAM: PORTABLE CHEST - 1 VIEW  COMPARISON:  PA and lateral chest 04/22/2015.   FINDINGS: The patient has a left pneumothorax with the apex of the left long at the level of the posterior arc of the left fourth rib. Gas is seen dissecting into the soft tissues of the left neck and along the upper left chest wall. The lungs are clear. There are acute fractures of the left fourth through seventh ribs. The right lung is expanded and clear. Heart size is normal.  IMPRESSION: Small left pneumothorax estimated at 20%.  Left fourth through seventh rib fractures.  Gas in the soft tissues of the left side of the neck likely represents gas dissecting from the left chest wall but could be due to pneumomediastinum.  Critical Value/emergent results were called by telephone at the time of interpretation on 05/18/2015 at 5:06 pm to Dr. Carmell Austria, MD, who verbally acknowledged these results.   Electronically Signed   By: Drusilla Kanner M.D.   On: 05/18/2015 17:10     PE: General appearance: alert, cooperative and no distress Resp: clear to auscultation bilaterally. Left CT without an air leak, serosanguinous output.   Cardio: regular rate and rhythm, S1, S2 normal, no murmur, click, rub or gallop GI: soft, non-tender; bowel sounds normal; no masses,  no organomegaly Extremities: extremities normal, atraumatic, no cyanosis or edema      Patient Active Problem List   Diagnosis Date Noted  . Pneumothorax, traumatic 05/18/2015  . Hx of cerebral infarction 08/01/2014  . Abnormality of gait 04/27/2014  . Osteoporosis 07/27/2013  . Sacral fracture 06/28/2013  . Depressive disorder, not elsewhere classified 06/28/2013  . Generalized anxiety disorder 02/22/2013  . Fatigue 09/21/2012  . Constipation - functional 04/15/2012  . Hyponatremia 04/13/2012  . LACUNAR INFARCTION 08/09/2010  . MYALGIA 05/10/2010  . INTERMITTENT CLAUDICATION, LEFT LEG 02/06/2010  . CEREBROVASCULAR ACCIDENT, HX OF 11/01/2009  . HYPERLIPIDEMIA 05/29/2009  . LEG PAIN, LEFT 10/03/2008  . UTI 06/06/2008  . HIP  PAIN, LEFT 07/02/2007  . LOW BACK PAIN, MILD 07/02/2007  . VERTIGO, BENIGN PAROXYSMAL POSITION 03/10/2007  . HYPERTENSION 02/23/2007  . OSTEOARTHRITIS 02/23/2007  . COMPRESSION FRACTURE 02/23/2007  . COLONOSCOPY, HX OF 02/23/2007    Assessment/Plan: GLF Left rib fractures 4-7/PTX-CT to suction, CXR in AM, if okay, water seal. Pulmonary toilet, add flutter valve VTE - SCD's, heparin ?CBD stone on CT-normal LFTs, no urgent work up needed now.  Follow clinically.   HTN-home meds GU-UA pending FEN - no issues, add scheduled tylenol Dispo -- PT/OT   Ashok Norris, ANP-BC Pager: [phone] General Trauma [place] Pager: [phone]   05/19/2015 7:57 AM

## 2015-05-19 NOTE — Evaluation (Signed)
Clinical/Bedside Swallow Evaluation Patient Details  Name: Gabriela Patel MRN: [id number] Date of Birth: Mar 08, 1927  Today's Date: 05/19/2015 Time: SLP Start Time (ACUTE ONLY): 1355 SLP Stop Time (ACUTE ONLY): 1418 SLP Time Calculation (min) (ACUTE ONLY): 23 min  Past Medical History:  Past Medical History  Diagnosis Date  . Hypertension   . Osteoporosis   . CVA (cerebral vascular accident) 11/01/2009  . Osteoarthritis   . Hyperlipidemia   . Fatigue     related to statin?  . Bladder incontinence    Past Surgical History:  Past Surgical History  Procedure Laterality Date  . Abdominal hysterectomy    . Cataract extraction      Left  . Vein ligation      Right Leg   HPI:  Pt is an 81 yo s/p CVA with mild residual left sided weakness (lower extremity) She had an unwitnessed fall on 05/18/15 and was able to get back into her chair by herself. She had chest pain and shortness of breath after fall. This has improved. She did not really remember why she fell when her family and ED doc asked her. She denies headache or abdominal pain. She did not pass out. She is not on any blood thinners other than baby aspirin. She lives in a sort of independent studio apartment where some help comes a little, but not really an assisted living facility.    Assessment / Plan / Recommendation Clinical Impression   Pt without overt s/s of aspiration noted during BSE with various consistencies; distracted d/t pain (nursing informed) during BSE, but was able to complete despite this issue.  Pt exhibited a timely swallow and normal oropharyngeal swallow overall; medication may be crushed in puree consistency to assist with consumption of medications as this can be difficult intermittently per nursing/family.  ST evaluation only; recommend continue current diet of Regular/thin with modification only for medication administration.    Aspiration Risk  None    Diet Recommendation Thin;Other (Comment)  (Regular)   Medication Administration: Crushed with puree Compensations: Slow rate;Small sips/bites    Other  Recommendations Oral Care Recommendations: Oral care BID   Follow Up Recommendations    n/a   Frequency and Duration   n/a     Pertinent Vitals/Pain WDL    SLP Swallow Goals  See POC   Swallow Study Prior Functional Status   Modified independent studio apartment; has family check in intermittently each day    General Date of Onset: 05/18/15 Other Pertinent Information: Pt is an 81 yo female s/p CVA with mild residual left sided weakness. She had an unwitnessed fall, and was able to get back into her chair by herself. She had chest pain and shortness of breath after fall. This has improved. She did not really remember why she fell when her family and ED doc asked her. She denies headache or abdominal pain. She did not pass out. She is not on any blood thinners other than baby aspirin. She lives in a sort of independent studio apartment where some help comes a little, but not really an assisted living facility.  Type of Study: Bedside swallow evaluation Diet Prior to this Study: Regular;Thin liquids Temperature Spikes Noted: No Respiratory Status: Supplemental O2 delivered via (comment) ([place]) History of Recent Intubation: No Behavior/Cognition: Alert;Cooperative;Distractible;Other (Comment) (secondary to pain) Oral Cavity - Dentition: Adequate natural dentition/normal for age Self-Feeding Abilities: Able to feed self Patient Positioning: Upright in bed Baseline Vocal Quality: Low vocal intensity Volitional Cough: Weak Volitional  Swallow: Able to elicit    Oral/Motor/Sensory Function Overall Oral Motor/Sensory Function: Appears within functional limits for tasks assessed   Ice Chips Ice chips: Not tested   Thin Liquid Thin Liquid: Within functional limits Presentation: Straw    Nectar Thick Nectar Thick Liquid: Not tested   Honey Thick Honey Thick Liquid: Not  tested   Puree Puree: Within functional limits   Solid       Solid: Within functional limits       ADAMS,PAT, M.S., CCC-SLP 05/19/2015,2:49 PM

## 2015-05-19 NOTE — ED Notes (Signed)
Attempted to call report to [facility] [place]. Josh, RN unavailable. Left number for floor nurse to return call for report.

## 2015-05-20 ENCOUNTER — Inpatient Hospital Stay (HOSPITAL_COMMUNITY): Payer: Medicare Other

## 2015-05-20 NOTE — Evaluation (Signed)
Physical Therapy Evaluation Patient Details Name: Gabriela Patel MRN: [id number] DOB: 1927/09/18 Today's Date: 05/20/2015   History of Present Illness  Pt is an 81 yo s/p CVA with mild residual left sided weakness (lower extremity) She had an unwitnessed fall on 05/18/15 and was able to get back into her chair by herself. She had chest pain and shortness of breath after fall. This has improved. She did not really remember why she fell when her family and ED doc asked her. She denies headache or abdominal pain. She did not pass out. She is not on any blood thinners other than baby aspirin. She lives in a sort of independent studio apartment where some help comes a little, but not really an assisted living facility.   Clinical Impression  Pt admitted with above diagnosis. Pt currently with functional limitations due to the deficits listed below (see PT Problem List). Pt was able to get OOB to chair with assist.  Pt in pain but tolerated getting up better than expected.  Will benefit from REhab at [facility] prior to d/c back to I living.  Will follow acutely.  Pt will benefit from skilled PT to increase their independence and safety with mobility to allow discharge to the venue listed below.      Follow Up Recommendations SNF;Supervision/Assistance - 24 hour    Equipment Recommendations  Other (comment) (TBA)    Recommendations for Other Services       Precautions / Restrictions Precautions Precautions: Fall Precaution Comments: CT on suction and not to remove on Sat. therefore transfers only Restrictions Weight Bearing Restrictions: No      Mobility  Bed Mobility Overal bed mobility: Needs Assistance;+2 for physical assistance Bed Mobility: Supine to Sit     Supine to sit: Mod assist     General bed mobility comments: Pt needed assist for LEs and elevation of trunk.  Used pad to assist and pt reached for PT's hands to pull up on.    Transfers Overall transfer level: Needs  assistance Equipment used: 2 person hand held assist Transfers: Sit to/from [employer] Sit to Stand: Mod assist Stand pivot transfers: Min assist       General transfer comment: Pt able to hold onto PT's hands/arms bil and stand and take pivotal steps to recliner.  Slight posterior lean.    Ambulation/Gait                Stairs            Wheelchair Mobility    Modified Rankin (Stroke Patients Only)       Balance Overall balance assessment: Needs assistance;History of Falls Sitting-balance support: No upper extremity supported;Feet supported Sitting balance-Leahy Scale: Fair   Postural control: Posterior lean Standing balance support: Bilateral upper extremity supported;During functional activity Standing balance-Leahy Scale: Poor Standing balance comment: requires UE support bil                             Pertinent Vitals/Pain Pain Assessment: Faces Faces Pain Scale: Hurts even more Pain Location: left ribs Pain Descriptors / Indicators: Grimacing;Guarding;Sore Pain Intervention(s): Limited activity within patient's tolerance;Monitored during session;Premedicated before session;Repositioned  123/71, 95 bpm, 96% on 1LO2.      Home Living Family/patient expects to be discharged to:: Skilled nursing facility                 Additional Comments: Dtr reports pt has been in Independent Living  almost 2 yrs    Prior Function Level of Independence: Needs assistance   Gait / Transfers Assistance Needed: uses RW; ambulates to dining room for meals "and not much more" per dtr; has had 6 episodes of HHPT in past 22 months "problem is she doesn't follow through on anything they teach her"  ADL's / Homemaking Assistance Needed: aide 7days/week ~1 hour/day for bathing, laundry, etc  Pt tells this PT that aide just sits in chair and does nothing the hour she is with her.        Hand Dominance   Dominant Hand: Right     Extremity/Trunk Assessment   Upper Extremity Assessment: Defer to OT evaluation           Lower Extremity Assessment: Generalized weakness      Cervical / Trunk Assessment: Kyphotic  Communication   Communication: No difficulties  Cognition Arousal/Alertness: Awake/alert Behavior During Therapy: Anxious Overall Cognitive Status: Within Functional Limits for tasks assessed                      General Comments      Exercises        Assessment/Plan    PT Assessment Patient needs continued PT services  PT Diagnosis Generalized weakness;Acute pain   PT Problem List Decreased balance;Decreased activity tolerance;Decreased mobility;Decreased knowledge of use of DME;Decreased safety awareness;Decreased knowledge of precautions;Pain;Decreased strength  PT Treatment Interventions DME instruction;Gait training;Functional mobility training;Therapeutic activities;Therapeutic exercise;Balance training;Patient/family education   PT Goals (Current goals can be found in the Care Plan section) Acute Rehab PT Goals Patient Stated Goal: to get better PT Goal Formulation: With patient Time For Goal Achievement: 06/03/15 Potential to Achieve Goals: Good    Frequency Min 3X/week   Barriers to discharge Decreased caregiver support      Co-evaluation               End of Session Equipment Utilized During Treatment: Gait belt;Oxygen Activity Tolerance: Patient limited by fatigue;Patient limited by pain (limited byCT had to stay on suction) Patient left: in chair;with call bell/phone within reach;with chair alarm set Nurse Communication: Mobility status         Time: 1610-9604 PT Time Calculation (min) (ACUTE ONLY): 19 min   Charges:   PT Evaluation $Initial PT Evaluation Tier I: 1 Procedure     PT G CodesBerline Lopes 19-Jun-2015, 2:08 PM  [facility] [phone] (pager)

## 2015-05-20 NOTE — Progress Notes (Signed)
Trauma Service Note  Subjective: Patient eating breakfast.  No distress until she tries to move.  Objective: Vital signs in last 24 hours: Temp:  [97.7 F (36.5 C)-98.3 F (36.8 C)] 98.3 F (36.8 C) (08/13 0719) Pulse Rate:  [78-94] 94 (08/13 0718) Resp:  [15-25] 24 (08/13 0718) BP: (137-182)/(64-82) 182/82 mmHg (08/13 0718) SpO2:  [93 %-97 %] 93 % (08/13 0718)    Intake/Output from previous day: 08/12 0701 - 08/13 0700 In: 320 [P.O.:120; IV Piggyback:200] Out: 440 [Urine:400; Chest Tube:40] Intake/Output this shift: Total I/O In: -  Out: 80 [Chest Tube:80]  General: No acute distress at rest  Lungs: Decreased on the left.  CT is small and almost occluded with clotted blood.  Not sure if that is why the CXR today shows that her ling is dropping.  10-15% PTX.    Abd: Benign  Extremities:  No changes  Neuro: confused but conversant.  Lab Results: CBC   Recent Labs  05/18/15 1725  WBC 12.0*  HGB 13.1  HCT 38.8  PLT 189   BMET  Recent Labs  05/18/15 1725  NA 131*  K 3.7  CL 96*  CO2 24  GLUCOSE 131*  BUN 6  CREATININE 0.72  CALCIUM 8.7*   PT/INR No results for input(s): LABPROT, INR in the last 72 hours. ABG No results for input(s): PHART, HCO3 in the last 72 hours.  Invalid input(s): PCO2, PO2  Studies/Results: Dg Ankle Complete Left  05/18/2015   CLINICAL DATA:  Status post fall today. Mild left ankle swelling. Pain. Initial encounter.  EXAM: LEFT ANKLE COMPLETE - 3+ VIEW  COMPARISON:  None.  FINDINGS: Imaged bones, joints and soft tissues appear normal.  IMPRESSION: Negative exam.   Electronically Signed   By: Drusilla Kanner M.D.   On: 05/18/2015 18:02   Ct Head Wo Contrast  05/18/2015   CLINICAL DATA:  Fall from standing position mechanical . C/o pain under left scapula, lower back pain and sob  EXAM: CT HEAD WITHOUT CONTRAST  CT CERVICAL SPINE WITHOUT CONTRAST  TECHNIQUE: Multidetector CT imaging of the head and cervical spine was performed  following the standard protocol without intravenous contrast. Multiplanar CT image reconstructions of the cervical spine were also generated.  COMPARISON:  04/22/2015  FINDINGS: CT HEAD FINDINGS  Ventricles are normal configuration. There is ventricular and sulcal enlargement reflecting mild generalized atrophy. There are no parenchymal masses or mass effect. There is no evidence of a cortical infarct. Patchy areas of white matter hypoattenuation noted consistent with moderate chronic microvascular ischemic change. Possible old lacune infarct in the left pons.  There are no extra-axial masses or abnormal fluid collections.  There is no intracranial hemorrhage.  Sinuses and mastoid air cells are essentially clear. No skull fracture.  There is soft tissue air below the left temporal bone posterior to the temporomandibular joint and extending along the left posterior upper neck. The etiology of this is unclear.  CT CERVICAL SPINE FINDINGS  Soft tissue air is seen along the posterior and left lateral aspect of the neck extending to the left supraclavicular region. The etiology of this is from the left pneumothorax.  No cervical spine fracture. No spondylolisthesis. Bones are demineralized. Facet degenerative change, mild, as noted on the right at C 7-T1. There is no significant central stenosis. Uncovertebral spurring leads to mild left neural foraminal narrowing at C5-C6. Facet spurring leads to mild neural foraminal narrowing on the right at C7-T1.  There is a moderate left pneumothorax. A  minimally displaced fracture the posterior left third rib is noted.  IMPRESSION: HEAD CT:  No acute intracranial abnormalities.  No skull fracture.  CERVICAL CT: No cervical spine fracture or spondylolisthesis. Mildly displaced left posterior third rib fracture. Moderate left pneumothorax with soft tissue air extending along the left posterior and lateral neck.   Electronically Signed   By: Amie Portland M.D.   On: 05/18/2015 19:22    Ct Chest W Contrast  05/18/2015   CLINICAL DATA:  81 year old female with history of trauma from a fall from a standing position. Pain underneath the left scapula and in the lower back. Shortness of breath.  EXAM: CT CHEST, ABDOMEN, AND PELVIS WITH CONTRAST  TECHNIQUE: Multidetector CT imaging of the chest, abdomen and pelvis was performed following the standard protocol during bolus administration of intravenous contrast.  CONTRAST:  OMNIPAQUE IOHEXOL 300 MG/ML  SOLN  COMPARISON:  No priors.  FINDINGS: CT CHEST FINDINGS  Mediastinum/Lymph Nodes: No abnormal high attenuation fluid within the mediastinum to suggest posttraumatic mediastinal hematoma. No evidence of posttraumatic aortic dissection/transection. Heart size is normal. There is no significant pericardial fluid, thickening or pericardial calcification. There is atherosclerosis of the thoracic aorta, the great vessels of the mediastinum and the coronary arteries, including calcified atherosclerotic plaque in the left main, left anterior descending and right coronary arteries. No pathologically enlarged mediastinal or hilar lymph nodes. Esophagus is unremarkable in appearance. No axillary lymphadenopathy.  Lungs/Pleura: Moderate left-sided pneumothorax occupying approximately 30% of the volume of the left hemithorax. Small volume of intermediate attenuation fluid in the left hemithorax, likely minimally proteinaceous/hemorrhagic. Passive atelectasis in portions of the left upper and left lower lobes dependently. Small amount of atelectasis or scarring in the basal portion of the right lower lobe dependently.  Musculoskeletal/Soft Tissues: Acute minimally displaced fractures of the left third, fourth, fifth and sixth ribs posteriorly are noted. There is emphysema in the adjacent fat and muscular tissues of the left chest wall both anteriorly and posteriorly. Multiple old chronic compression fractures are noted, most evident at T7, T8 and T10, most  severe at T10 where there is 50% loss of anterior vertebral body height.  CT ABDOMEN AND PELVIS FINDINGS  Hepatobiliary: No signs of acute traumatic injury to the liver. No cystic or solid hepatic lesions. Minimal intrahepatic biliary ductal dilatation. Common bile duct measures 10 mm in the porta hepatis. Possible noncalcified stone in the distal aspect of the common bile duct best appreciated at the transition from image [id number]. Calcified gallstone in the dependent portion of the gallbladder. Gallbladder does not appear distended, and there is no gallbladder wall thickening or pericholecystic fluid to suggest an acute cholecystitis at this time.  Pancreas: No pancreatic mass. Pancreatic duct is upper limits of normal in size measuring 3 mm in the pancreatic head. No pancreatic or peripancreatic fluid or inflammatory changes.  Spleen: No findings to suggest acute traumatic injury to the spleen.  Adrenals/Urinary Tract: Multiple low-attenuation lesions in the kidneys bilaterally, compatible with simple cysts, largest of which is exophytic extending off the posterior aspect of the upper pole of the left kidney measuring up to 2.9 cm. No hydroureteronephrosis. Urinary bladder is normal in appearance.  Stomach/Bowel: No signs of acute traumatic injury to the stomach, small bowel or colon. Normal appearance of the stomach. No pathologic dilatation of small bowel or colon. Numerous colonic diverticulae are noted, without surrounding inflammatory changes to suggest an acute diverticulitis at this time.  Vascular/Lymphatic: No evidence of acute traumatic injury to  the major arteries or veins of the abdomen and pelvis. Atherosclerosis throughout the abdominal and pelvic vasculature, without evidence of aneurysm or dissection. No lymphadenopathy noted in the abdomen or pelvis.  Reproductive: Status post hysterectomy. Ovaries are not confidently identified may be surgically absent or atrophic.  Other: No high attenuation fluid  collection within the peritoneal cavity or retroperitoneum to suggest posttraumatic hemorrhage. No significant volume of ascites. No pneumoperitoneum.  Musculoskeletal: Surgical clips in the right groin, likely from prior vascular access. No acute displaced fractures in the abdomen or pelvis. No aggressive appearing lytic or blastic lesions noted in the visualized portions of the axial and appendicular skeleton. Chronic appearing compression fracture of superior endplate of L1 and L3 are noted.  IMPRESSION: 1. Multiple minimally displaced posterior left-sided rib fractures involving the left third, fourth, fifth and sixth ribs, with moderate left hydropneumothorax (pleural fluid in this collection is mildly proteinaceous/hemorrhagic, but not a frank hemothorax). There is extensive gas in the subcutaneous fat and deep musculature of the left chest wall. 2. This is associated with some passive atelectasis in the left upper and left lower lobes. 3. No other signs of significant acute traumatic injury to the abdomen or pelvis. 4. There is mild intra and extrahepatic biliary ductal dilatation, and there appears to potentially be a noncalcified stone in the distal common bile duct. If there is any clinical concern for potential biliary tract obstruction, nonemergent follow-up evaluation with MRI of the abdomen with and without IV gadolinium with MRCP could be performed in the near future; however, this is best performed when the patient is clinically stable and able to reliably hold her breath during the examination. 5. Atherosclerosis, including left main and 2 vessel coronary artery disease. 6. Colonic diverticulosis without evidence of acute diverticulitis at this time. 7. Additional incidental findings, as above. These results were called by telephone at the time of interpretation on 05/18/2015 at 7:36 pm to Dr. Lyndal Pulley, who verbally acknowledged these results.   Electronically Signed   By: Trudie Reed M.D.    On: 05/18/2015 19:38   Ct Cervical Spine Wo Contrast  05/18/2015   CLINICAL DATA:  Fall from standing position mechanical . C/o pain under left scapula, lower back pain and sob  EXAM: CT HEAD WITHOUT CONTRAST  CT CERVICAL SPINE WITHOUT CONTRAST  TECHNIQUE: Multidetector CT imaging of the head and cervical spine was performed following the standard protocol without intravenous contrast. Multiplanar CT image reconstructions of the cervical spine were also generated.  COMPARISON:  04/22/2015  FINDINGS: CT HEAD FINDINGS  Ventricles are normal configuration. There is ventricular and sulcal enlargement reflecting mild generalized atrophy. There are no parenchymal masses or mass effect. There is no evidence of a cortical infarct. Patchy areas of white matter hypoattenuation noted consistent with moderate chronic microvascular ischemic change. Possible old lacune infarct in the left pons.  There are no extra-axial masses or abnormal fluid collections.  There is no intracranial hemorrhage.  Sinuses and mastoid air cells are essentially clear. No skull fracture.  There is soft tissue air below the left temporal bone posterior to the temporomandibular joint and extending along the left posterior upper neck. The etiology of this is unclear.  CT CERVICAL SPINE FINDINGS  Soft tissue air is seen along the posterior and left lateral aspect of the neck extending to the left supraclavicular region. The etiology of this is from the left pneumothorax.  No cervical spine fracture. No spondylolisthesis. Bones are demineralized. Facet degenerative change, mild, as  noted on the right at C 7-T1. There is no significant central stenosis. Uncovertebral spurring leads to mild left neural foraminal narrowing at C5-C6. Facet spurring leads to mild neural foraminal narrowing on the right at C7-T1.  There is a moderate left pneumothorax. A minimally displaced fracture the posterior left third rib is noted.  IMPRESSION: HEAD CT:  No acute  intracranial abnormalities.  No skull fracture.  CERVICAL CT: No cervical spine fracture or spondylolisthesis. Mildly displaced left posterior third rib fracture. Moderate left pneumothorax with soft tissue air extending along the left posterior and lateral neck.   Electronically Signed   By: Amie Portland M.D.   On: 05/18/2015 19:22   Ct Abdomen Pelvis W Contrast  05/18/2015   CLINICAL DATA:  81 year old female with history of trauma from a fall from a standing position. Pain underneath the left scapula and in the lower back. Shortness of breath.  EXAM: CT CHEST, ABDOMEN, AND PELVIS WITH CONTRAST  TECHNIQUE: Multidetector CT imaging of the chest, abdomen and pelvis was performed following the standard protocol during bolus administration of intravenous contrast.  CONTRAST:  OMNIPAQUE IOHEXOL 300 MG/ML  SOLN  COMPARISON:  No priors.  FINDINGS: CT CHEST FINDINGS  Mediastinum/Lymph Nodes: No abnormal high attenuation fluid within the mediastinum to suggest posttraumatic mediastinal hematoma. No evidence of posttraumatic aortic dissection/transection. Heart size is normal. There is no significant pericardial fluid, thickening or pericardial calcification. There is atherosclerosis of the thoracic aorta, the great vessels of the mediastinum and the coronary arteries, including calcified atherosclerotic plaque in the left main, left anterior descending and right coronary arteries. No pathologically enlarged mediastinal or hilar lymph nodes. Esophagus is unremarkable in appearance. No axillary lymphadenopathy.  Lungs/Pleura: Moderate left-sided pneumothorax occupying approximately 30% of the volume of the left hemithorax. Small volume of intermediate attenuation fluid in the left hemithorax, likely minimally proteinaceous/hemorrhagic. Passive atelectasis in portions of the left upper and left lower lobes dependently. Small amount of atelectasis or scarring in the basal portion of the right lower lobe dependently.   Musculoskeletal/Soft Tissues: Acute minimally displaced fractures of the left third, fourth, fifth and sixth ribs posteriorly are noted. There is emphysema in the adjacent fat and muscular tissues of the left chest wall both anteriorly and posteriorly. Multiple old chronic compression fractures are noted, most evident at T7, T8 and T10, most severe at T10 where there is 50% loss of anterior vertebral body height.  CT ABDOMEN AND PELVIS FINDINGS  Hepatobiliary: No signs of acute traumatic injury to the liver. No cystic or solid hepatic lesions. Minimal intrahepatic biliary ductal dilatation. Common bile duct measures 10 mm in the porta hepatis. Possible noncalcified stone in the distal aspect of the common bile duct best appreciated at the transition from image [id number]. Calcified gallstone in the dependent portion of the gallbladder. Gallbladder does not appear distended, and there is no gallbladder wall thickening or pericholecystic fluid to suggest an acute cholecystitis at this time.  Pancreas: No pancreatic mass. Pancreatic duct is upper limits of normal in size measuring 3 mm in the pancreatic head. No pancreatic or peripancreatic fluid or inflammatory changes.  Spleen: No findings to suggest acute traumatic injury to the spleen.  Adrenals/Urinary Tract: Multiple low-attenuation lesions in the kidneys bilaterally, compatible with simple cysts, largest of which is exophytic extending off the posterior aspect of the upper pole of the left kidney measuring up to 2.9 cm. No hydroureteronephrosis. Urinary bladder is normal in appearance.  Stomach/Bowel: No signs of acute traumatic injury  to the stomach, small bowel or colon. Normal appearance of the stomach. No pathologic dilatation of small bowel or colon. Numerous colonic diverticulae are noted, without surrounding inflammatory changes to suggest an acute diverticulitis at this time.  Vascular/Lymphatic: No evidence of acute traumatic injury to the major arteries or  veins of the abdomen and pelvis. Atherosclerosis throughout the abdominal and pelvic vasculature, without evidence of aneurysm or dissection. No lymphadenopathy noted in the abdomen or pelvis.  Reproductive: Status post hysterectomy. Ovaries are not confidently identified may be surgically absent or atrophic.  Other: No high attenuation fluid collection within the peritoneal cavity or retroperitoneum to suggest posttraumatic hemorrhage. No significant volume of ascites. No pneumoperitoneum.  Musculoskeletal: Surgical clips in the right groin, likely from prior vascular access. No acute displaced fractures in the abdomen or pelvis. No aggressive appearing lytic or blastic lesions noted in the visualized portions of the axial and appendicular skeleton. Chronic appearing compression fracture of superior endplate of L1 and L3 are noted.  IMPRESSION: 1. Multiple minimally displaced posterior left-sided rib fractures involving the left third, fourth, fifth and sixth ribs, with moderate left hydropneumothorax (pleural fluid in this collection is mildly proteinaceous/hemorrhagic, but not a frank hemothorax). There is extensive gas in the subcutaneous fat and deep musculature of the left chest wall. 2. This is associated with some passive atelectasis in the left upper and left lower lobes. 3. No other signs of significant acute traumatic injury to the abdomen or pelvis. 4. There is mild intra and extrahepatic biliary ductal dilatation, and there appears to potentially be a noncalcified stone in the distal common bile duct. If there is any clinical concern for potential biliary tract obstruction, nonemergent follow-up evaluation with MRI of the abdomen with and without IV gadolinium with MRCP could be performed in the near future; however, this is best performed when the patient is clinically stable and able to reliably hold her breath during the examination. 5. Atherosclerosis, including left main and 2 vessel coronary  artery disease. 6. Colonic diverticulosis without evidence of acute diverticulitis at this time. 7. Additional incidental findings, as above. These results were called by telephone at the time of interpretation on 05/18/2015 at 7:36 pm to Dr. Lyndal Pulley, who verbally acknowledged these results.   Electronically Signed   By: Trudie Reed M.D.   On: 05/18/2015 19:38   Dg Pelvis Portable  05/18/2015   CLINICAL DATA:  Trauma.  Recent fall.  EXAM: PORTABLE PELVIS 1-2 VIEWS  COMPARISON:  CT 05/26/2013  FINDINGS: Pelvic bony ring is intact. Symmetric appearance of the sacroiliac joints. Again noted are surgical clips in the right groin region. No gross abnormality to the proximal femurs.  IMPRESSION: No acute bone abnormality to the pelvis.   Electronically Signed   By: Richarda Overlie M.D.   On: 05/18/2015 17:06   Dg Chest Port 1 View  05/20/2015   CLINICAL DATA:  Pneumothorax  EXAM: PORTABLE CHEST - 1 VIEW  COMPARISON:  05/18/2015  FINDINGS: There is a left-sided chest tube in similar position to the prior exam. There is a small left apical pneumothorax measuring 9 mm. There are multiple left posterior rib fractures again noted.  There is no focal parenchymal opacity. There is no significant pleural effusion. No right pneumothorax. There is mild cardiomegaly.  The osseous structures are unremarkable.  IMPRESSION: 1. Left-sided chest tube in similar position to the prior exam with a small persistent left apical pneumothorax measuring less than 10%.   Electronically Signed  By: Elige Ko   On: 05/20/2015 09:00   Dg Chest Port 1 View  05/18/2015   CLINICAL DATA:  81 year old female with left-sided chest tube.  EXAM: PORTABLE CHEST - 1 VIEW  COMPARISON:  Chest x-ray 05/18/2015.  FINDINGS: Interval placement of left-sided chest tube with tip in position in the mid left hemithorax. Side port projects over the mid left hemithorax as well. Multiple left-sided rib fractures are again noted (posterior aspects of the  left third, fourth, fifth and sixth ribs). Previously demonstrated left-sided pneumothorax appears decreased in size, now all likely less than 10% of the left hemithorax. Known small left pleural effusion is poorly demonstrated on this plain film examination. Opacities at the left base favored to reflect subsegmental atelectasis. Right lung appears clear. Pulmonary venous congestion, without frank pulmonary edema. Mild cardiomegaly. The patient is rotated to the right on today's exam, resulting in distortion of the mediastinal contours and reduced diagnostic sensitivity and specificity for mediastinal pathology. Atherosclerosis in the thoracic aorta. Emphysema in the left chest wall.  IMPRESSION: 1. Interval placement of left-sided chest tube which appears appropriately located, with decreased size of small left hydropneumothorax. 2. Mild cardiomegaly with pulmonary venous congestion, but no frank pulmonary edema. 3. Atherosclerosis.   Electronically Signed   By: Trudie Reed M.D.   On: 05/18/2015 20:15   Dg Chest Port 1 View  05/18/2015   CLINICAL DATA:  Status post fall from a standing position. Left chest pain.  EXAM: PORTABLE CHEST - 1 VIEW  COMPARISON:  PA and lateral chest 04/22/2015.  FINDINGS: The patient has a left pneumothorax with the apex of the left long at the level of the posterior arc of the left fourth rib. Gas is seen dissecting into the soft tissues of the left neck and along the upper left chest wall. The lungs are clear. There are acute fractures of the left fourth through seventh ribs. The right lung is expanded and clear. Heart size is normal.  IMPRESSION: Small left pneumothorax estimated at 20%.  Left fourth through seventh rib fractures.  Gas in the soft tissues of the left side of the neck likely represents gas dissecting from the left chest wall but could be due to pneumomediastinum.  Critical Value/emergent results were called by telephone at the time of interpretation on  05/18/2015 at 5:06 pm to Dr. Carmell Austria, MD, who verbally acknowledged these results.   Electronically Signed   By: Drusilla Kanner M.D.   On: 05/18/2015 17:10    Anti-infectives: Anti-infectives    Start     Dose/Rate Route Frequency Ordered Stop   05/18/15 2030  ciprofloxacin (CIPRO) IVPB 400 mg     400 mg 200 mL/hr over 60 Minutes Intravenous Every 12 hours 05/18/15 2026        Assessment/Plan: s/p  Increased CT suction to -40  Keep in SDU  LOS: 2 days   Marta Lamas. Gae Bon, MD, FACS [phone] Trauma Surgeon 05/20/2015

## 2015-05-21 ENCOUNTER — Inpatient Hospital Stay (HOSPITAL_COMMUNITY): Payer: Medicare Other

## 2015-05-21 MED ORDER — WHITE PETROLATUM GEL
Status: AC
  Filled 2015-05-21: qty 1

## 2015-05-21 NOTE — Progress Notes (Signed)
Trauma Service Note  Subjective: Patient less confused.  Still having a lot of pain at the chest tube site.  Objective: Vital signs in last 24 hours: Temp:  [97.7 F (36.5 C)-98.5 F (36.9 C)] 97.7 F (36.5 C) (08/14 0715) Pulse Rate:  [87-99] 89 (08/14 0715) Resp:  [13-32] 32 (08/14 0715) BP: (123-148)/(61-81) 148/61 mmHg (08/14 0715) SpO2:  [91 %-98 %] 95 % (08/14 0715)    Intake/Output from previous day: 08/13 0701 - 08/14 0700 In: 1520 [P.O.:1320; IV Piggyback:200] Out: 100 [Chest Tube:100] Intake/Output this shift: Total I/O In: 320 [P.O.:120; IV Piggyback:200] Out: 80 [Chest Tube:80]  General: No acute distress  Lungs: Clear on the left, and not as decreased..  CXR seems to be improved.  Abd: Benign  Extremities: No clinical signs or symptoms of DVT  Neuro: Intact, less confused.  Lab Results: CBC   Recent Labs  05/18/15 1725  WBC 12.0*  HGB 13.1  HCT 38.8  PLT 189   BMET  Recent Labs  05/18/15 1725  NA 131*  K 3.7  CL 96*  CO2 24  GLUCOSE 131*  BUN 6  CREATININE 0.72  CALCIUM 8.7*   PT/INR No results for input(s): LABPROT, INR in the last 72 hours. ABG No results for input(s): PHART, HCO3 in the last 72 hours.  Invalid input(s): PCO2, PO2  Studies/Results: Dg Chest Port 1 View  05/20/2015   CLINICAL DATA:  Pneumothorax  EXAM: PORTABLE CHEST - 1 VIEW  COMPARISON:  05/18/2015  FINDINGS: There is a left-sided chest tube in similar position to the prior exam. There is a small left apical pneumothorax measuring 9 mm. There are multiple left posterior rib fractures again noted.  There is no focal parenchymal opacity. There is no significant pleural effusion. No right pneumothorax. There is mild cardiomegaly.  The osseous structures are unremarkable.  IMPRESSION: 1. Left-sided chest tube in similar position to the prior exam with a small persistent left apical pneumothorax measuring less than 10%.   Electronically Signed   By: Elige Ko   On:  05/20/2015 09:00    Anti-infectives: Anti-infectives    Start     Dose/Rate Route Frequency Ordered Stop   05/18/15 2030  ciprofloxacin (CIPRO) IVPB 400 mg     400 mg 200 mL/hr over 60 Minutes Intravenous Every 12 hours 05/18/15 2026        Assessment/Plan: s/p  Decrease CT suction back to -20  CXR tomorrow AM.  If okay, may be able to put on waterseal tomorrow Am and DC in the afternoon.  LOS: 3 days   Marta Lamas. Gae Bon, MD, FACS [phone] Trauma Surgeon 05/21/2015

## 2015-05-21 NOTE — Clinical Social Work Note (Signed)
Clinical Social Work Assessment  Patient Details  Name: Gabriela Patel MRN: [id number] Date of Birth: [birth year]  Date of referral:  05/21/15               Reason for consult:  Facility Placement                Permission sought to share information with:  Case Manager, Magazine features editor, Family Supports Permission granted to share information::  Yes, Verbal Permission Granted  Name::        Agency::  SNF search [facility])  Relationship::  daughter [name] (via phone)  Contact Information:     Housing/Transportation Living arrangements for the past 2 months:  Independent Living Facility Source of Information:  Patient, Medical Team, Adult Children Patient Interpreter Needed:  None Criminal Activity/Legal Involvement Pertinent to Current Situation/Hospitalization:  No - Comment as needed Significant Relationships:  Adult Children, Other Family Members, Siblings, Community Support Lives with:  Self, Facility Resident Do you feel safe going back to the place where you live?  No (needing higher level of care: SNF) Need for family participation in patient care:  Yes (Comment) (per request)  Care giving concerns:  Daughter contacted via phone as no family in room. Patient and family are originally from [place], however daughter reports she has moved to [place], [place] with her family.  WIll be here on Monday for coordination of care. Reports family has been proactive in searching for next level of care.  Interested and accepting of SNF at this time. Patient has been to [facility] in the past *two times, and family has explored [facility] in [place].  Reports they are willing to relocate in the near future if patient were to need home assistance and actively investigating Medicaid for patient, but at this time she does not qualify per there meeting with DSS.  Once she spends down, family will apply.   Social Worker assessment / plan:  LCSW is following for weekend needs. Patient  is a new SNF placement due to recent fall. Was in ILF, unable to return, due to needing higher level of care. Daughter contacted and agreeable to plan for SNF. Explained process and asked detailed questions about ALF placement, medicaid, and LTC in SNF.  All questions answered. Daughter to come on Monday to help with disposition and needs. FL2 completed for patient and patient faxed out with permission from daughter. Unit CSW to follow up with bed offers with potiental DC the first of the week.  Due to patient being a trauma patient.  SBIRT completed and filed in flowsheets.  NO SA needs or concerns.  Employment status:  Retired Health and safety inspector:  Medicare, Games developer, Self Pay (Medicaid Pending) (will be applying in the up coming months) PT Recommendations:  Skilled Nursing Facility Information / Referral to community resources:  Skilled Nursing Facility  Patient/Family's Response to care:  Agreeable to recommendation for SNF  Patient/Family's Understanding of and Emotional Response to Diagnosis, Current Treatment, and Prognosis:  Daughter very involved and proactive with care AEB researching SNFs and medicaid options. Daughter expresses frustration in the system with regards to navigating and understanding levels of care, but appreciative of information and assistance with patient.  Emotional Assessment Appearance:  Appears stated age Attitude/Demeanor/Rapport:  Other (slightly confused, and in pain, daughter called, most of assessment completed with daughter) Affect (typically observed):  Accepting, Anxious Orientation:  Oriented to Self Alcohol / Substance use:  Not Applicable Psych involvement (Current and /or in the  community):  No (Comment)  Discharge Needs  Concerns to be addressed:  Denies Needs/Concerns at this time Readmission within the last 30 days:  No Current discharge risk:  None Barriers to Discharge:  No Barriers Identified, Continued Medical Work up   Raye Sorrow, LCSW 05/21/2015, 11:19 AM

## 2015-05-21 NOTE — Clinical Social Work Placement (Signed)
   CLINICAL SOCIAL WORK PLACEMENT  NOTE  Date:  05/21/2015  Patient Details  Name: Gabriela Patel MRN: [id number] Date of Birth: 06-02-1927  Clinical Social Work is seeking post-discharge placement for this patient at the Skilled  Nursing Facility level of care (*CSW will initial, date and re-position this form in  chart as items are completed):  Yes   Patient/family provided with [place] Clinical Social Work Department's list of facilities offering this level of care within the geographic area requested by the patient (or if unable, by the patient's family).  Yes   Patient/family informed of their freedom to choose among providers that offer the needed level of care, that participate in Medicare, Medicaid or managed care program needed by the patient, have an available bed and are willing to accept the patient.  Yes   Patient/family informed of [facility]'s ownership interest in [facility] and [facility], as well as of the fact that they are under no obligation to receive care at these facilities.  PASRR submitted to EDS on       PASRR number received on       Existing PASRR number confirmed on 05/21/15     FL2 transmitted to all facilities in geographic area requested by pt/family on 05/21/15     FL2 transmitted to all facilities within larger geographic area on       Patient informed that his/her managed care company has contracts with or will negotiate with certain facilities, including the following:            Patient/family informed of bed offers received.  Patient chooses bed at       Physician recommends and patient chooses bed at      Patient to be transferred to   on  .  Patient to be transferred to facility by       Patient family notified on   of transfer.  Name of family member notified:        PHYSICIAN Please prepare priority discharge summary, including medications, Please prepare prescriptions, Please sign FL2     Additional Comment:     _______________________________________________ Raye Sorrow, LCSW 05/21/2015, 11:24 AM

## 2015-05-22 ENCOUNTER — Inpatient Hospital Stay (HOSPITAL_COMMUNITY): Payer: Medicare Other

## 2015-05-22 MED ORDER — ENSURE ENLIVE PO LIQD
237.0000 mL | Freq: Two times a day (BID) | ORAL | Status: DC
  Administered 2015-05-22: 237 mL via ORAL

## 2015-05-22 NOTE — Care Management Important Message (Signed)
Important Message  Patient Details  Name: VINCENT EHRLER MRN: [id number] Date of Birth: April 23, 1927   Medicare Important Message Given:  Yes-second notification given    Kyla Balzarine 05/22/2015, 11:25 AMImportant Message  Patient Details  Name: KIMARIA STRUTHERS MRN: [id number] Date of Birth: 12/24/1926   Medicare Important Message Given:  Yes-second notification given    Kyla Balzarine 05/22/2015, 11:25 AM

## 2015-05-22 NOTE — Progress Notes (Signed)
Physical Therapy Treatment Patient Details Name: KRYSTALYN KUBOTA MRN: [id number] DOB: 06-Aug-1927 Today's Date: 05/22/2015    History of Present Illness This 81 y.o. female admitted after sustaining unwitnessed  fall at ILF.  She was able to get back in her chair by herself, but had c/o chest pain and SOB.  PMH found to have sustained Lt rib fractures 4-7 wtih pneunothorax and UTI.   PMH includes CVA with residual Rt sided weakness, HTN, osteoporosis, OA    PT Comments    Progressing slowly, Periods of lucidity with ?delerium.  Emphasis on bed mobility, sitting balance and progressive ambulation.  Follow Up Recommendations  SNF;Supervision/Assistance - 24 hour     Equipment Recommendations  Other (comment) (TBA)    Recommendations for Other Services       Precautions / Restrictions Precautions Precautions: Fall Precaution Comments: CT removed Restrictions Weight Bearing Restrictions: No    Mobility  Bed Mobility Overal bed mobility: Needs Assistance Bed Mobility: Rolling;Sidelying to Sit;Sit to Sidelying Rolling: Mod assist Sidelying to sit: Max assist     Sit to sidelying: Max assist General bed mobility comments: Supported trunk throughout due to pain  Transfers Overall transfer level: Needs assistance   Transfers: Sit to/from Stand Sit to Stand: Min assist         General transfer comment: cues for hand placement  Ambulation/Gait Ambulation/Gait assistance: Min assist Ambulation Distance (Feet): 20 Feet Assistive device: Rolling walker (2 wheeled) Gait Pattern/deviations: Step-through pattern;Decreased step length - right;Decreased step length - left;Decreased stride length Gait velocity: slower   General Gait Details: small unsteady steps with trunk list to the left.   Stairs            Wheelchair Mobility    Modified Rankin (Stroke Patients Only)       Balance Overall balance assessment: Needs assistance Sitting-balance support: Single  extremity supported;No upper extremity supported Sitting balance-Leahy Scale: Fair Sitting balance - Comments: sat EOB while getting cleaned up after incontinence x 10 min     Standing balance-Leahy Scale: Poor                      Cognition Arousal/Alertness: Awake/alert Behavior During Therapy: Anxious Overall Cognitive Status: Within Functional Limits for tasks assessed                      Exercises      General Comments General comments (skin integrity, edema, etc.): VSS      Pertinent Vitals/Pain Pain Assessment: Faces Faces Pain Scale: Hurts whole lot Pain Location: L ribs and flank Pain Descriptors / Indicators: Constant;Aching;Sore Pain Intervention(s): Monitored during session;Repositioned;Limited activity within patient's tolerance    Home Living                      Prior Function            PT Goals (current goals can now be found in the care plan section) Acute Rehab PT Goals Patient Stated Goal: to get better PT Goal Formulation: With patient Time For Goal Achievement: 06/03/15 Potential to Achieve Goals: Good Progress towards PT goals: Progressing toward goals    Frequency  Min 3X/week    PT Plan Current plan remains appropriate    Co-evaluation             End of Session   Activity Tolerance: No increased pain;Patient tolerated treatment well Patient left: in bed;with call bell/phone within reach;with nursing/sitter in  room     Time: 0981-1914 PT Time Calculation (min) (ACUTE ONLY): 34 min  Charges:  $Gait Training: 8-22 mins $Therapeutic Activity: 8-22 mins                    G Codes:      Jacy Brocker, Eliseo Gum 05/22/2015, 5:24 PM 05/22/2015  [place], PT [phone]  (pager)

## 2015-05-22 NOTE — Progress Notes (Signed)
Received portable chest xray report and chest tube in soft tissue.  Gabriela Patel [facility], notified and orders given to discontinue chest tube.  CT dc'd intact, patient tolerated without event.  Site unremarkable.  No complaints.

## 2015-05-22 NOTE — Evaluation (Signed)
Occupational Therapy Evaluation Patient Details Name: Gabriela Patel MRN: [id number] DOB: December 05, 1926 Today's Date: 05/22/2015    History of Present Illness This 81 y.o. female admitted after sustaining unwitnessed  fall at ILF.  She was able to get back in her chair by herself, but had c/o chest pain and SOB.  PMH found to have sustained Lt rib fractures 4-7 wtih pneunothorax and UTI.   PMH includes CVA with residual Rt sided weakness, HTN, osteoporosis, OA   Clinical Impression   Pt admitted with above. She demonstrates the below listed deficits and will benefit from continued OT to maximize safety and independence with BADLs.  Pt currently, requires mod A for ADLs.  Recommend SNF level rehab at discharge.  Will follow acutely.       Follow Up Recommendations  SNF    Equipment Recommendations  None recommended by OT    Recommendations for Other Services       Precautions / Restrictions Precautions Precautions: Fall Precaution Comments: CT removed Restrictions Weight Bearing Restrictions: No      Mobility Bed Mobility               General bed mobility comments: sitting up in recliner  Transfers Overall transfer level: Needs assistance Equipment used: Rolling walker (2 wheeled) Transfers: Sit to/from [employer] Sit to Stand: Min assist Stand pivot transfers: Min assist       General transfer comment: Assist for balance and RW    Balance Overall balance assessment: Needs assistance Sitting-balance support: Feet supported Sitting balance-Leahy Scale: Fair     Standing balance support: During functional activity;Bilateral upper extremity supported Standing balance-Leahy Scale: Poor                              ADL Overall ADL's : Needs assistance/impaired Eating/Feeding: Set up;Sitting   Grooming: Wash/dry hands;Wash/dry face;Oral care;Brushing hair;Set up;Sitting   Upper Body Bathing: Minimal assitance;Sitting    Lower Body Bathing: Moderate assistance;Sit to/from stand   Upper Body Dressing : Minimal assistance;Sitting   Lower Body Dressing: Moderate assistance;Sit to/from stand Lower Body Dressing Details (indicate cue type and reason): Pt limited by pain Toilet Transfer: Moderate assistance;Ambulation;BSC;RW Toilet Transfer Details (indicate cue type and reason): Mod A for balance and for RW use and safety  Toileting- Clothing Manipulation and Hygiene: Maximal assistance;Sit to/from stand       Functional mobility during ADLs: Moderate assistance;Rolling walker       Vision     Perception     Praxis      Pertinent Vitals/Pain Pain Assessment: Faces Faces Pain Scale: Hurts even more Pain Location: Lt ribs and flank  Pain Descriptors / Indicators: Grimacing;Guarding Pain Intervention(s): Monitored during session;Limited activity within patient's tolerance;RN gave pain meds during session     Hand Dominance Right   Extremity/Trunk Assessment Upper Extremity Assessment Upper Extremity Assessment: LUE deficits/detail LUE Deficits / Details: Pt with limited shoulder elevation to ~75* due to Lt rib pain   Lower Extremity Assessment Lower Extremity Assessment: Defer to PT evaluation   Cervical / Trunk Assessment Cervical / Trunk Assessment: Kyphotic   Communication Communication Communication: No difficulties   Cognition Arousal/Alertness: Awake/alert Behavior During Therapy: WFL for tasks assessed/performed Overall Cognitive Status: History of cognitive impairments - at baseline (Pt unreliable historian.  Dtr reports baseline)                     General Comments  Exercises       Shoulder Instructions      Home Living Family/patient expects to be discharged to:: Skilled nursing facility                                 Additional Comments: Dtr reports pt has been in Independent Living almost 2 yrs      Prior Functioning/Environment  Level of Independence: Needs assistance  Gait / Transfers Assistance Needed: uses RW; ambulates to dining room for meals "and not much more" per dtr; has had 6 episodes of HHPT in past 22 months "problem is she doesn't follow through on anything they teach her" ADL's / Homemaking Assistance Needed: aide 7days/week ~1 hour/day for bathing, laundry, etc  Pt tells this PT that aide just sits in chair and does nothing the hour she is with her.   Comments: Daughter reports that the pt has become progressively sedentary    OT Diagnosis: Generalized weakness;Cognitive deficits;Acute pain   OT Problem List: Decreased strength;Decreased activity tolerance;Impaired balance (sitting and/or standing);Decreased cognition;Decreased safety awareness;Decreased knowledge of use of DME or AE;Pain   OT Treatment/Interventions: Self-care/ADL training;Therapeutic exercise;Energy conservation;DME and/or AE instruction;Therapeutic activities;Cognitive remediation/compensation;Balance training;Patient/family education    OT Goals(Current goals can be found in the care plan section) Acute Rehab OT Goals Patient Stated Goal: To get better OT Goal Formulation: With patient/family Time For Goal Achievement: 06/05/15 Potential to Achieve Goals: Good ADL Goals Pt Will Perform Grooming: with min guard assist;standing Pt Will Perform Upper Body Bathing: with supervision;sitting Pt Will Perform Lower Body Bathing: with min guard assist;sit to/from stand Pt Will Perform Upper Body Dressing: with set-up;sitting Pt Will Perform Lower Body Dressing: with min guard assist;sit to/from stand Pt Will Transfer to Toilet: with min assist;regular height toilet;ambulating;grab bars  OT Frequency: Min 2X/week   Barriers to D/C: Decreased caregiver support          Co-evaluation              End of Session Equipment Utilized During Treatment: Rolling walker Nurse Communication: Mobility status  Activity Tolerance:  Patient limited by pain;Patient limited by fatigue Patient left: in chair;with call bell/phone within reach;with family/visitor present   Time: 1610-9604 OT Time Calculation (min): 35 min Charges:  OT General Charges $OT Visit: 1 Procedure OT Evaluation $Initial OT Evaluation Tier I: 1 Procedure OT Treatments $Self Care/Home Management : 8-22 mins G-Codes:    Loris Seelye M 2015-06-19, 11:00 PM

## 2015-05-22 NOTE — Progress Notes (Signed)
Trauma Service Note  Subjective: Patient feeling well.  No distress  Objective: Vital signs in last 24 hours: Temp:  [97.7 F (36.5 C)-98.6 F (37 C)] 98.3 F (36.8 C) (08/15 0753) Pulse Rate:  [84-95] 84 (08/15 0635) Resp:  [14-29] 20 (08/15 0753) BP: (125-156)/(57-80) 150/62 mmHg (08/15 0753) SpO2:  [90 %-100 %] 96 % (08/15 0753)    Intake/Output from previous day: 08/14 0701 - 08/15 0700 In: 1960 [P.O.:1560; IV Piggyback:400] Out: 715 [Urine:525; Chest Tube:190] Intake/Output this shift: Total I/O In: 520 [P.O.:320; IV Piggyback:200] Out: 200 [Urine:200]  General: No acute distress.  Lungs: Clear.  CXR shows left lung nearly fully up.  No air leak  Abd: Benign  Extremities: No changes  Neuro: The same as yesterday.  Lab Results: CBC  No results for input(s): WBC, HGB, HCT, PLT in the last 72 hours. BMET No results for input(s): NA, K, CL, CO2, GLUCOSE, BUN, CREATININE, CALCIUM in the last 72 hours. PT/INR No results for input(s): LABPROT, INR in the last 72 hours. ABG No results for input(s): PHART, HCO3 in the last 72 hours.  Invalid input(s): PCO2, PO2  Studies/Results: Dg Chest Port 1 View  05/22/2015   CLINICAL DATA:  Chest trauma.  EXAM: PORTABLE CHEST - 1 VIEW  COMPARISON:  05/21/2015.  FINDINGS: Patient is rotated to the right. Heart size is stable. Mild bibasilar subsegmental atelectasis. Tiny left apical pneumothorax again noted. Multiple displaced left rib fractures again noted.  IMPRESSION: 1. Multiple displaced left rib fractures again noted. Tiny left apical pneumothorax again noted. 2. Mild bibasilar subsegmental atelectasis.   Electronically Signed   By: Maisie Fus  Register   On: 05/22/2015 07:48   Dg Chest Port 1 View  05/21/2015   CLINICAL DATA:  Chest trauma, left pneumothorax  EXAM: PORTABLE CHEST - 1 VIEW  COMPARISON:  05/20/15  FINDINGS: Cardiomediastinal silhouette is stable. Multiple left rib fractures are again noted. Persistent small left  pleural effusion with left basilar atelectasis or infiltrate. Left chest tube is unchanged in position. Tiny left apical pneumothorax. No pulmonary edema.  IMPRESSION: Stable left chest tube position. Multiple left rib fractures are again noted. Small left pleural effusion with left basilar atelectasis or infiltrate. Tiny left apical pneumothorax.   Electronically Signed   By: Natasha Mead M.D.   On: 05/21/2015 10:01    Anti-infectives: Anti-infectives    Start     Dose/Rate Route Frequency Ordered Stop   05/18/15 2030  ciprofloxacin (CIPRO) IVPB 400 mg     400 mg 200 mL/hr over 60 Minutes Intravenous Every 12 hours 05/18/15 2026        Assessment/Plan: s/p  CT to watersealCheck CXR later today, if okay will DC chest tube.  Likely to SNF tomorrow.  LOS: 4 days   Marta Lamas. Gae Bon, MD, FACS [phone] Trauma Surgeon 05/22/2015

## 2015-05-22 NOTE — Progress Notes (Signed)
Initial Nutrition Assessment  DOCUMENTATION CODES:   Severe malnutrition in context of chronic illness  INTERVENTION:    Ensure Enlive po BID, each supplement provides 350 kcal and 20 grams of protein  NUTRITION DIAGNOSIS:   Malnutrition related to chronic illness as evidenced by severe depletion of muscle mass, percent weight loss (7.5% weight loss within the past month).  GOAL:   Patient will meet greater than or equal to 90% of their needs  MONITOR:   PO intake, Supplement acceptance, Labs, Weight trends  REASON FOR ASSESSMENT:   Consult Assessment of nutrition requirement/status  ASSESSMENT:   Pt is s/p CVA with mild residual right sided weakness. She had an unwitnessed fall, and was able to get back into her chair by herself. She had chest pain and shortness of breath after fall. This has improved.  Labs reviewed: sodium is low at 131.   Intake of meals 15-50%. 7.5% weight loss within the past month. Nutrition-Focused physical exam completed. Findings are no fat depletion, mild-moderate and severe muscle depletion, and no edema.  Patient reports that she thinks she is eating pretty well. RN reports patient is eating fair, taking liquids well. Patient says she has had Ensure in the past, but not recently. Agreed to drink it between meals. Nutritional services ambassador assisting patient with meal options.   Diet Order:  Diet regular Room service appropriate?: Yes; Fluid consistency:: Thin  Skin:  Reviewed, no issues  Last BM:  unknown  Height:   Ht Readings from Last 1 Encounters:  05/19/15  (1.575 m)    Weight:   Wt Readings from Last 1 Encounters:  05/19/15 125 lb 3.5 oz (56.8 kg)    Ideal Body Weight:  50 kg  BMI:  Body mass index is 22.9 kg/(m^2).  Estimated Nutritional Needs:   Kcal:  1600-1800  Protein:  75-90 gm  Fluid:  1.6-1.8 L  EDUCATION NEEDS:   No education needs identified at this time  Joaquin Courts, RD, LDN,  CNSC Pager [phone] After Hours Pager [phone]

## 2015-05-23 ENCOUNTER — Inpatient Hospital Stay (HOSPITAL_COMMUNITY): Payer: Medicare Other

## 2015-05-23 DIAGNOSIS — S2242XA Multiple fractures of ribs, left side, initial encounter for closed fracture: Secondary | ICD-10-CM | POA: Diagnosis present

## 2015-05-23 DIAGNOSIS — W19XXXA Unspecified fall, initial encounter: Secondary | ICD-10-CM | POA: Diagnosis present

## 2015-05-23 MED ORDER — OXYCODONE-ACETAMINOPHEN 2.5-325 MG PO TABS: 1.0000 | ORAL_TABLET | ORAL | Status: DC | PRN

## 2015-05-23 NOTE — Progress Notes (Signed)
Patient pleasant.  CXR from today shows slight left effusion..  Breath sounds are clear and equal.  No significant PTX.  Okay to be discharged to [facility] O. Gae Bon, MD, FACS [phone] Trauma Surgeon

## 2015-05-23 NOTE — Clinical Social Work Note (Signed)
CSW had discussion with patient's daughter yesterday evening in regards to DC plan. At this time [facility] states they should be able to offer the patient a bed as long as the chest tube is not needed at discharge. CSW will assist with DC.   Roddie Mc MSW, [place], [place], [phone]

## 2015-05-23 NOTE — Clinical Social Work Placement (Signed)
   CLINICAL SOCIAL WORK PLACEMENT  NOTE  Date:  05/23/2015  Patient Details  Name: Gabriela Patel MRN: [id number] Date of Birth: 05/07/1927  Clinical Social Work is seeking post-discharge placement for this patient at the Skilled  Nursing Facility level of care (*CSW will initial, date and re-position this form in  chart as items are completed):  Yes   Patient/family provided with [facility] Clinical Social Work Department's list of facilities offering this level of care within the geographic area requested by the patient (or if unable, by the patient's family).  Yes   Patient/family informed of their freedom to choose among providers that offer the needed level of care, that participate in Medicare, Medicaid or managed care program needed by the patient, have an available bed and are willing to accept the patient.  Yes   Patient/family informed of [facility]'s ownership interest in [facility] and [facility], as well as of the fact that they are under no obligation to receive care at these facilities.  PASRR submitted to EDS on       PASRR number received on       Existing PASRR number confirmed on 05/21/15     FL2 transmitted to all facilities in geographic area requested by pt/family on 05/21/15     FL2 transmitted to all facilities within larger geographic area on       Patient informed that his/her managed care company has contracts with or will negotiate with certain facilities, including the following:        Yes   Patient/family informed of bed offers received.  Patient chooses bed at [facility]     Physician recommends and patient chooses bed at      Patient to be transferred to [facility] on 05/23/15.  Patient to be transferred to facility by Ambulance     Patient family notified on 05/23/15 of transfer.  Name of family member notified:  [name]     PHYSICIAN Please prepare priority discharge summary, including medications,  Please prepare prescriptions, Please sign FL2     Additional Comment:  Per MD patient ready for DC to [facility]. RN, patient, patient's family, and facility notified of DC. RN given number for report. DC packet on chart. Ambulance transport requested for patient. CSW signing off.   _______________________________________________ Roddie Mc MSW, [place], [place], [phone]

## 2015-05-23 NOTE — Care Management Note (Signed)
Case Management Note  Patient Details  Name: Gabriela Patel MRN: [id number] Date of Birth: 1927-01-16  Subjective/Objective:  PT/OT recommending SNF for rehab prior to dc home.                    Action/Plan: Pt stable for dc; will dc to SNF today, per CSW arrangements.    Expected Discharge Date:     05/23/15             Expected Discharge Plan:  Skilled Nursing Facility  In-House Referral:  Clinical Social Work  Discharge planning Services  CM Consult  Post Acute Care Choice:    Choice offered to:     DME Arranged:    DME Agency:     HH Arranged:    HH Agency:     Status of Service:  Completed, signed off  Medicare Important Message Given:  Yes-second notification given Date Medicare IM Given:    Medicare IM give by:    Date Additional Medicare IM Given:    Additional Medicare Important Message give by:     If discussed at Long Length of Stay Meetings, dates discussed:    Additional Comments:  Quintella Baton, RN, BSN  Trauma/Neuro ICU Case Manager [phone]

## 2015-05-23 NOTE — Progress Notes (Signed)
DC packet given to [facility]. VSS. This RN called report to Misty Stanley, Charity fundraiser at [employer]. All belongings sent with patient at time of transfer. Family present at time of transfer. PIV DC, hemostasis achieved.

## 2015-05-23 NOTE — Discharge Summary (Signed)
Physician Discharge Summary  Patient ID: Gabriela Patel MRN: [id number] DOB/AGE: 07-Jul-1927 81 y.o.  Admit date: 05/18/2015 Discharge date: 05/23/2015  Discharge Diagnoses Patient Active Problem List   Diagnosis Date Noted  . Fall 05/23/2015  . Fracture of multiple ribs of left side 05/23/2015  . Pneumothorax, traumatic 05/18/2015  . Hx of cerebral infarction 08/01/2014  . Abnormality of gait 04/27/2014  . Osteoporosis 07/27/2013  . Sacral fracture 06/28/2013  . Depressive disorder, not elsewhere classified 06/28/2013  . Generalized anxiety disorder 02/22/2013  . Fatigue 09/21/2012  . Constipation - functional 04/15/2012  . Hyponatremia 04/13/2012  . LACUNAR INFARCTION 08/09/2010  . MYALGIA 05/10/2010  . INTERMITTENT CLAUDICATION, LEFT LEG 02/06/2010  . CEREBROVASCULAR ACCIDENT, HX OF 11/01/2009  . HYPERLIPIDEMIA 05/29/2009  . LEG PAIN, LEFT 10/03/2008  . UTI 06/06/2008  . HIP PAIN, LEFT 07/02/2007  . LOW BACK PAIN, MILD 07/02/2007  . VERTIGO, BENIGN PAROXYSMAL POSITION 03/10/2007  . HYPERTENSION 02/23/2007  . OSTEOARTHRITIS 02/23/2007  . COMPRESSION FRACTURE 02/23/2007  . COLONOSCOPY, HX OF 02/23/2007    Consultants None   Procedures 8/11 -- Left tube thoracostomy by Dr. Almond Lint   HPI: Gabriela Patel is s/p CVA with mild residual right-sided weakness.She had an unwitnessed fall but was able to get back into her chair by herself.She had chest pain and shortness of breath after the fall. This had improved; she did not really remember why she fell when her family and EDP asked her. She denied passing out.Her workup included CT scans of the head, cervical spine, chest, abdomen, and pelvis and showed the rib fractures and pneumothorax. A chest tube was placed in the ED and she was admitted to the trauma service.   Hospital Course: The patient's chest tube was able to be weaned to water seal and removed without difficulty. She did not suffer any respiratory compromise  from her rib fractures. She was mobilized with physical and occupational therapies who recommended skilled nursing facility placement. The patient and her family were in agreement and she was discharged there in good condition.     Medication List    TAKE these medications        aspirin 81 MG tablet  Take 81 mg by mouth every morning.     calcium-vitamin D 500-200 MG-UNIT per tablet  Commonly known as:  OSCAL WITH D  Take 1 tablet by mouth 2 (two) times daily. SPLIT TABLETS IN HALF FOR EASIER SWALLOWING PURPOSES     citalopram 10 MG tablet  Commonly known as:  CELEXA  TAKE 2 TABLETS (20 MG TOTAL) BY MOUTH DAILY.     ibuprofen 200 MG tablet  Commonly known as:  ADVIL,MOTRIN  Take 400 mg by mouth every 6 (six) hours as needed. For pain     losartan 25 MG tablet  Commonly known as:  COZAAR  TAKE 1 TABLET TWICE A DAY     multivitamin with minerals Tabs tablet  Take 1 tablet by mouth every morning.     oxybutynin 5 MG 24 hr tablet  Commonly known as:  DITROPAN-XL  Take 5 mg by mouth daily.     oxycodone-acetaminophen 2.5-325 MG per tablet  Commonly known as:  PERCOCET  Take 1-2 tablets by mouth every 4 (four) hours as needed for pain.     pravastatin 20 MG tablet  Commonly known as:  PRAVACHOL  Take 20 mg by mouth at bedtime.            Follow-up Information  Schedule an appointment as soon as possible for a visit with Ginette Otto, MD.   Specialty:  Internal Medicine   Contact information:   [address] [place] [address] [phone]       Call [facility].   Why:  As needed   Contact information:   [address] [place] [address] [phone]       Signed: Freeman Caldron, PA-C Pager: [phone] General Trauma PA Pager: [phone] 05/23/2015, 10:45 AM

## 2015-06-14 ENCOUNTER — Encounter (HOSPITAL_COMMUNITY): Payer: Self-pay | Admitting: Emergency Medicine

## 2015-06-14 ENCOUNTER — Emergency Department (HOSPITAL_COMMUNITY): Payer: Medicare Other

## 2015-06-14 ENCOUNTER — Emergency Department (HOSPITAL_COMMUNITY)
Admission: EM | Admit: 2015-06-14 | Discharge: 2015-06-14 | Disposition: A | Discharge status: Home / Self Care | Payer: Medicare Other | Attending: Emergency Medicine | Admitting: Emergency Medicine

## 2015-06-14 DIAGNOSIS — Z7982 Long term (current) use of aspirin: Secondary | ICD-10-CM | POA: Insufficient documentation

## 2015-06-14 DIAGNOSIS — M1990 Unspecified osteoarthritis, unspecified site: Secondary | ICD-10-CM | POA: Diagnosis not present

## 2015-06-14 DIAGNOSIS — I10 Essential (primary) hypertension: Secondary | ICD-10-CM | POA: Diagnosis not present

## 2015-06-14 DIAGNOSIS — Z8673 Personal history of transient ischemic attack (TIA), and cerebral infarction without residual deficits: Secondary | ICD-10-CM | POA: Diagnosis not present

## 2015-06-14 DIAGNOSIS — E785 Hyperlipidemia, unspecified: Secondary | ICD-10-CM | POA: Diagnosis not present

## 2015-06-14 DIAGNOSIS — Z79899 Other long term (current) drug therapy: Secondary | ICD-10-CM | POA: Diagnosis not present

## 2015-06-14 DIAGNOSIS — R079 Chest pain, unspecified: Secondary | ICD-10-CM | POA: Diagnosis not present

## 2015-06-14 DIAGNOSIS — R0602 Shortness of breath: Secondary | ICD-10-CM | POA: Diagnosis not present

## 2015-06-14 LAB — BASIC METABOLIC PANEL
ANION GAP: 7 (ref 5–15)
BUN: 6 mg/dL (ref 6–20)
CO2: 28 mmol/L (ref 22–32)
Calcium: 9 mg/dL (ref 8.9–10.3)
Chloride: 99 mmol/L — ABNORMAL LOW (ref 101–111)
Creatinine, Ser: 0.65 mg/dL (ref 0.44–1.00)
GFR calc Af Amer: >60 mL/min (ref >60)
GLUCOSE: 102 mg/dL — AB (ref 65–99)
POTASSIUM: 3.7 mmol/L (ref 3.5–5.1)
Sodium: 134 mmol/L — ABNORMAL LOW (ref 135–145)

## 2015-06-14 LAB — CBC
HEMATOCRIT: 37.5 % (ref 36.0–46.0)
HEMOGLOBIN: 12.3 g/dL (ref 12.0–15.0)
MCH: 30.4 pg (ref 26.0–34.0)
MCHC: 32.8 g/dL (ref 30.0–36.0)
MCV: 92.6 fL (ref 78.0–100.0)
Platelets: 243 10*3/uL (ref 150–400)
RBC: 4.05 MIL/uL (ref 3.87–5.11)
RDW: 14.1 % (ref 11.5–15.5)
WBC: 8.5 10*3/uL (ref 4.0–10.5)

## 2015-06-14 LAB — I-STAT TROPONIN, ED: Troponin i, poc: 0.01 ng/mL (ref 0.00–0.08)

## 2015-06-14 NOTE — ED Notes (Signed)
Pt arrived by [facility]. EMS called to respond due to SOB and left sided chest pain. EMS vitals 146/86, P70, R22, O295-97 but initial O2 stated by [facility] was 86%, BG 100. Nursing facility also stated that Pt seemed more lethargic than usual.

## 2015-06-14 NOTE — ED Notes (Signed)
NAD. Pt discharged with family.

## 2015-06-14 NOTE — ED Provider Notes (Signed)
CSN: [id number]     Arrival date & time 06/14/15  0857 History   First MD Initiated Contact with Patient 06/14/15 [phone]     Chief Complaint  Patient presents with  . Shortness of Breath     (Consider location/radiation/quality/duration/timing/severity/associated sxs/prior Treatment) Patient is a 81 y.o. female presenting with shortness of breath. The history is provided by the patient.  Shortness of Breath Severity:  Moderate Onset quality:  Sudden Timing:  Constant Progression:  Unchanged Chronicity:  New Relieved by:  Nothing Worsened by:  Nothing tried Associated symptoms: chest pain (L upper chest, unble to describe it)   Associated symptoms: no cough, no fever and no vomiting   Risk factors: recent surgery (recent chest tube for L sided PTX from some rib fractures.)     Past Medical History  Diagnosis Date  . Hypertension   . Osteoporosis   . CVA (cerebral vascular accident) 11/01/2009  . Osteoarthritis   . Hyperlipidemia   . Fatigue     related to statin?  . Bladder incontinence    Past Surgical History  Procedure Laterality Date  . Abdominal hysterectomy    . Cataract extraction      Left  . Vein ligation      Right Leg   Family History  Problem Relation Age of Onset  . Coronary artery disease    . Hip fracture Mother     B/L  . Heart attack Father   . Heart disease Brother    Social History  Substance Use Topics  . Smoking status: Never Smoker   . Smokeless tobacco: Never Used  . Alcohol Use: No   OB History    No data available     Review of Systems  Constitutional: Negative for fever.  Respiratory: Positive for shortness of breath. Negative for cough.   Cardiovascular: Positive for chest pain (L upper chest, unble to describe it). Negative for leg swelling.  Gastrointestinal: Negative for vomiting.  All other systems reviewed and are negative.     Allergies  Review of patient's allergies indicates no known allergies.  Home Medications    Prior to Admission medications   Medication Sig Start Date End Date Taking? Authorizing Provider  aspirin 81 MG tablet Take 81 mg by mouth every morning.     Historical Provider, MD  calcium-vitamin D (OSCAL WITH D) 500-200 MG-UNIT per tablet Take 1 tablet by mouth 2 (two) times daily. SPLIT TABLETS IN HALF FOR EASIER SWALLOWING PURPOSES    Historical Provider, MD  citalopram (CELEXA) 10 MG tablet TAKE 2 TABLETS (20 MG TOTAL) BY MOUTH DAILY. Patient taking differently: Take 3 tablets by mouth once daily in the evening 07/28/14   Micki Riley, MD  ibuprofen (ADVIL,MOTRIN) 200 MG tablet Take 400 mg by mouth every 6 (six) hours as needed. For pain    Historical Provider, MD  losartan (COZAAR) 25 MG tablet TAKE 1 TABLET TWICE A DAY 11/24/14   Lelon Perla, DO  Multiple Vitamin (MULTIVITAMIN WITH MINERALS) TABS Take 1 tablet by mouth every morning.     Historical Provider, MD  oxybutynin (DITROPAN-XL) 5 MG 24 hr tablet Take 5 mg by mouth daily. 04/03/15   Historical Provider, MD  oxycodone-acetaminophen (PERCOCET) 2.5-325 MG per tablet Take 1-2 tablets by mouth every 4 (four) hours as needed for pain. 05/23/15   Freeman Caldron, PA-C  pravastatin (PRAVACHOL) 20 MG tablet Take 20 mg by mouth at bedtime.    Historical Provider, MD  BP 160/74 mmHg  Pulse 68  Temp(Src) 97.4 F (36.3 C) (Oral)  Ht  (1.6 m)  Wt 151 lb 10.8 oz (68.8 kg)  BMI 26.88 kg/m2  SpO2 96% Physical Exam  Constitutional: She is oriented to person, place, and time. She appears well-developed and well-nourished. No distress.  HENT:  Head: Normocephalic and atraumatic.  Mouth/Throat: Oropharynx is clear and moist.  Eyes: EOM are normal. Pupils are equal, round, and reactive to light.  Neck: Normal range of motion. Neck supple.  Cardiovascular: Normal rate and regular rhythm.  Exam reveals no friction rub.   No murmur heard. Pulmonary/Chest: Effort normal. No respiratory distress. She has decreased breath sounds (L  anterior upper fields, slight). She has no wheezes. She has no rales.  Abdominal: Soft. She exhibits no distension. There is no tenderness. There is no rebound.  Musculoskeletal: Normal range of motion. She exhibits no edema.  Neurological: She is alert and oriented to person, place, and time. No cranial nerve deficit. She exhibits normal muscle tone. Coordination normal.  Skin: Skin is warm. No rash noted. She is not diaphoretic.  Nursing note and vitals reviewed.   ED Course  Procedures (including critical care time) Labs Review Labs Reviewed  BASIC METABOLIC PANEL  CBC    Imaging Review Dg Chest 2 View  06/14/2015   CLINICAL DATA:  Shortness of breath.  EXAM: CHEST  2 VIEW  COMPARISON:  05/23/2015  FINDINGS: The patient is slightly rotated to the right. Cardiomediastinal silhouette is unchanged. The patient has taken a greater inspiration than on the prior study, and there is improved aeration of the left lung. No airspace consolidation, edema, or pneumothorax is identified. There is mild biapical pleural thickening. There is a small residual left pleural effusion. Numerous displaced left-sided rib fractures are again seen.  IMPRESSION: 1. Small residual left pleural effusion. 2. No evidence of acute airspace disease.   Electronically Signed   By: Sebastian Ache M.D.   On: 06/14/2015 09:56   I have personally reviewed and evaluated these images and lab results as part of my medical decision-making.   EKG Interpretation   Date/Time:  Wednesday June 14 2015 09:03:50 EDT Ventricular Rate:  75 PR Interval:  163 QRS Duration: 90 QT Interval:  429 QTC Calculation: 479 R Axis:   53 Text Interpretation:  Sinus rhythm Atrial premature complexes Low voltage,  extremity and precordial leads No significant change since last tracing  Confirmed by Gwendolyn Grant  MD, Caryl Fate ([id number]) on 06/14/2015 9:17:04 AM      MDM   Final diagnoses:  Shortness of breath    20F here with shortness of breath  and confusion. At [facility], has been there since he recent hospitalization for rib fractures and a pneumothorax. This morning was found confused, hypoxic, tachycardic. All vitals here and vitals reported on the nursing home sheet were normal however. Here she is doing well, remembers some L upper chest pain, denies any alleviating/exacerbating factors. She is a poor historian. She denies any nausea/vomiting. AFVSS here. L lung with slightly decreased lung sounds anteriorly, normal posteriorly, may be her normal. Will repeat a CXR and labs. EKG is similar to prior.  Labs ok, CXR ok. She is doing well, ambulating, family feels comfortable taking her home. Stable for discharge.   Elwin Mocha, MD 06/14/15 [phone]

## 2015-06-14 NOTE — Discharge Instructions (Signed)

## 2015-08-02 ENCOUNTER — Ambulatory Visit: Payer: Medicare Other | Admitting: Nurse Practitioner

## 2016-06-29 ENCOUNTER — Emergency Department (HOSPITAL_COMMUNITY)
Admission: EM | Admit: 2016-06-29 | Discharge: 2016-06-29 | Disposition: A | Discharge status: Home / Self Care | Payer: Medicare Other | Attending: Emergency Medicine | Admitting: Emergency Medicine

## 2016-06-29 ENCOUNTER — Emergency Department (HOSPITAL_COMMUNITY): Payer: Medicare Other

## 2016-06-29 ENCOUNTER — Encounter (HOSPITAL_COMMUNITY): Payer: Self-pay

## 2016-06-29 DIAGNOSIS — S80211A Abrasion, right knee, initial encounter: Secondary | ICD-10-CM | POA: Insufficient documentation

## 2016-06-29 DIAGNOSIS — Y999 Unspecified external cause status: Secondary | ICD-10-CM | POA: Insufficient documentation

## 2016-06-29 DIAGNOSIS — I10 Essential (primary) hypertension: Secondary | ICD-10-CM | POA: Insufficient documentation

## 2016-06-29 DIAGNOSIS — Y929 Unspecified place or not applicable: Secondary | ICD-10-CM | POA: Insufficient documentation

## 2016-06-29 DIAGNOSIS — S0191XA Laceration without foreign body of unspecified part of head, initial encounter: Secondary | ICD-10-CM

## 2016-06-29 DIAGNOSIS — Y939 Activity, unspecified: Secondary | ICD-10-CM | POA: Insufficient documentation

## 2016-06-29 DIAGNOSIS — Z8673 Personal history of transient ischemic attack (TIA), and cerebral infarction without residual deficits: Secondary | ICD-10-CM | POA: Insufficient documentation

## 2016-06-29 DIAGNOSIS — Z7982 Long term (current) use of aspirin: Secondary | ICD-10-CM | POA: Diagnosis not present

## 2016-06-29 DIAGNOSIS — S0181XA Laceration without foreign body of other part of head, initial encounter: Secondary | ICD-10-CM | POA: Diagnosis not present

## 2016-06-29 DIAGNOSIS — Z79899 Other long term (current) drug therapy: Secondary | ICD-10-CM | POA: Diagnosis not present

## 2016-06-29 DIAGNOSIS — S60211A Contusion of right wrist, initial encounter: Secondary | ICD-10-CM | POA: Insufficient documentation

## 2016-06-29 DIAGNOSIS — R930 Abnormal findings on diagnostic imaging of skull and head, not elsewhere classified: Secondary | ICD-10-CM | POA: Diagnosis not present

## 2016-06-29 DIAGNOSIS — W06XXXA Fall from bed, initial encounter: Secondary | ICD-10-CM | POA: Insufficient documentation

## 2016-06-29 MED ORDER — TETANUS-DIPHTH-ACELL PERTUSSIS 5-2.5-18.5 LF-MCG/0.5 IM SUSP
0.5000 mL | Freq: Once | INTRAMUSCULAR | Status: AC
  Administered 2016-06-29: 0.5 mL via INTRAMUSCULAR
  Filled 2016-06-29: qty 0.5

## 2016-06-29 MED ORDER — LIDOCAINE-EPINEPHRINE (PF) 1 %-1:200000 IJ SOLN
10.0000 mL | Freq: Once | INTRAMUSCULAR | Status: DC
  Filled 2016-06-29: qty 30

## 2016-06-29 NOTE — ED Notes (Signed)
Per EMS- pt from [facility] fell at 1930 last night. Witnessed by nursing home staff. Hit floor face first. 2-3 in laceration on forehead. Bleeding controlled, but it may need stitches.

## 2016-06-29 NOTE — ED Notes (Signed)
Bed: [id number] Expected date:  Expected time:  Means of arrival:  Comments: EMS 82yo Fall out bed / forehead lac

## 2016-06-29 NOTE — ED Provider Notes (Signed)
[facility]-EMERGENCY DEPT Provider Note   CSN: [id number] Arrival date & time: 06/29/16  0118  By signing my name below, I, Sandrea Hammond, attest that this documentation has been prepared under the direction and in the presence of Tomasita Crumble, MD. Electronically Signed: Sandrea Hammond, ED Scribe. 06/29/16. 1:44 AM.     History   Chief Complaint Chief Complaint  Patient presents with  . Fall  . Head Laceration    HPI Comments: Gabriela Patel is a 82 y.o. female who has a PMHx of CVA, HTN, and HLD presents to the Emergency Department via EMS for a head laceration s/p a fall that occurred ~ 1930 last night. Per nursing staff, she fell forward and hit her head face first while trying to get to her wheelchair.  She is currently being treated for UTI with augmentin.  . She has a small laceration to the left side of her forehead. Pt takes daily 81 mg ASA but is not on any other anticoagulants. She says she also hurt her hand during the fall. Pt denies abdominal pain and any other pain or complaints.  The history is provided by the patient. No language interpreter was used.    Past Medical History:  Diagnosis Date  . Bladder incontinence   . CVA (cerebral vascular accident) (HCC) 11/01/2009  . Fatigue    related to statin?  Marland Kitchen Hyperlipidemia   . Hypertension   . Osteoarthritis   . Osteoporosis     Patient Active Problem List   Diagnosis Date Noted  . Fall 05/23/2015  . Fracture of multiple ribs of left side 05/23/2015  . Pneumothorax, traumatic 05/18/2015  . Hx of cerebral infarction 08/01/2014  . Abnormality of gait 04/27/2014  . Osteoporosis 07/27/2013  . Sacral fracture (HCC) 06/28/2013  . Depressive disorder, not elsewhere classified 06/28/2013  . Generalized anxiety disorder 02/22/2013  . Fatigue 09/21/2012  . Constipation - functional 04/15/2012  . Hyponatremia 04/13/2012  . LACUNAR INFARCTION 08/09/2010  . MYALGIA 05/10/2010  . INTERMITTENT CLAUDICATION, LEFT LEG  02/06/2010  . CEREBROVASCULAR ACCIDENT, HX OF 11/01/2009  . HYPERLIPIDEMIA 05/29/2009  . LEG PAIN, LEFT 10/03/2008  . UTI 06/06/2008  . HIP PAIN, LEFT 07/02/2007  . LOW BACK PAIN, MILD 07/02/2007  . VERTIGO, BENIGN PAROXYSMAL POSITION 03/10/2007  . HYPERTENSION 02/23/2007  . OSTEOARTHRITIS 02/23/2007  . COMPRESSION FRACTURE 02/23/2007  . COLONOSCOPY, HX OF 02/23/2007    Past Surgical History:  Procedure Laterality Date  . ABDOMINAL HYSTERECTOMY    . CATARACT EXTRACTION     Left  . VEIN LIGATION     Right Leg    OB History    No data available       Home Medications    Prior to Admission medications   Medication Sig Start Date End Date Taking? Authorizing Provider  amoxicillin-clavulanate (AUGMENTIN) 875-125 MG tablet Take 1 tablet by mouth 2 (two) times daily.   Yes Historical Provider, MD  Artificial Tear Ointment (REFRESH LACRI-LUBE) OINT Apply 1 application to eye at bedtime.   Yes Historical Provider, MD  aspirin 81 MG chewable tablet Chew 81 mg by mouth daily.   Yes Historical Provider, MD  buPROPion (WELLBUTRIN) 100 MG tablet Take 100 mg by mouth 2 (two) times daily.   Yes Historical Provider, MD  calcium-vitamin D (OSCAL WITH D) 500-200 MG-UNIT per tablet Take 1 tablet by mouth 2 (two) times daily. SPLIT TABLETS IN HALF FOR EASIER SWALLOWING PURPOSES   Yes Historical Provider, MD  carboxymethylcellulose (REFRESH PLUS) 0.5 %  SOLN Place 1 drop into both eyes daily with breakfast.   Yes Historical Provider, MD  Cholecalciferol (VITAMIN D) 2000 units CAPS Take 2,000 Units by mouth daily.   Yes Historical Provider, MD  citalopram (CELEXA) 20 MG tablet Take 20 mg by mouth daily.   Yes Historical Provider, MD  denosumab (PROLIA) 60 MG/ML SOLN injection Inject 60 mg into the skin every 6 (six) months. Administer in upper arm, thigh, or abdomen   Yes Historical Provider, MD  divalproex (DEPAKOTE SPRINKLES) 125 MG capsule Take 125 mg by mouth 2 (two) times daily.   Yes  Historical Provider, MD  docusate sodium (COLACE) 100 MG capsule Take 100 mg by mouth 2 (two) times daily.   Yes Historical Provider, MD  LORazepam (ATIVAN) 0.5 MG tablet Take 0.5 mg by mouth daily at 2 PM. Given daily at 1300   Yes Historical Provider, MD  losartan (COZAAR) 25 MG tablet TAKE 1 TABLET TWICE A DAY 11/24/14  Yes Yvonne R Lowne Chase, DO  Melatonin CR (MELADOX) 3 MG TBCR Take 3 mg by mouth at bedtime.   Yes Historical Provider, MD  Multiple Vitamin (MULTIVITAMIN WITH MINERALS) TABS Take 1 tablet by mouth every morning.    Yes Historical Provider, MD  OLANZapine (ZYPREXA) 5 MG tablet Take 5 mg by mouth at bedtime.   Yes Historical Provider, MD  oxybutynin (DITROPAN-XL) 5 MG 24 hr tablet Take 5 mg by mouth daily. 04/03/15  Yes Historical Provider, MD  Polyethylene Glycol 3350 (MIRALAX PO) Take 17 g by mouth daily.   Yes Historical Provider, MD  trimethoprim (TRIMPEX) 100 MG tablet Take 100 mg by mouth daily.   Yes Historical Provider, MD  citalopram (CELEXA) 10 MG tablet TAKE 2 TABLETS (20 MG TOTAL) BY MOUTH DAILY. Patient not taking: Reported on 06/29/2016 07/28/14   Micki Riley, MD    Family History Family History  Problem Relation Age of Onset  . Hip fracture Mother     B/L  . Heart attack Father   . Heart disease Brother   . Coronary artery disease      Social History Social History  Substance Use Topics  . Smoking status: Never Smoker  . Smokeless tobacco: Never Used  . Alcohol use No     Allergies   Review of patient's allergies indicates no known allergies.   Review of Systems Review of Systems  A complete 10 system review of systems was obtained and all systems are negative except as noted in the HPI and PMH.    Physical Exam Updated Vital Signs BP (!) 115/49   Pulse 70   Temp 97.2 F (36.2 C) (Rectal)   Resp 16   SpO2 94%   Physical Exam  Constitutional: She appears well-developed and well-nourished. No distress.  HENT:  Head: Normocephalic.   Nose: Nose normal.  Mouth/Throat: Oropharynx is clear and moist. No oropharyngeal exudate.  2-cm T-shaped laceration to forehead Soft tissue swelling   Eyes: Conjunctivae and EOM are normal. Pupils are equal, round, and reactive to light. No scleral icterus.  Neck: No JVD present. No tracheal deviation present. No thyromegaly present.  Towel cervical collar in place  Cardiovascular: Normal rate, regular rhythm and normal heart sounds.  Exam reveals no gallop and no friction rub.   No murmur heard. Pulmonary/Chest: Effort normal and breath sounds normal. No respiratory distress. She has no wheezes. She exhibits no tenderness.  Abdominal: Soft. Bowel sounds are normal. She exhibits no distension and no mass. There  is no tenderness. There is no rebound and no guarding.  Musculoskeletal: Normal range of motion. She exhibits no edema or tenderness.  Soft tissue hematoma on the dorsum of the right wrist Small abrasion to right knee Left foot in ankle brace   Lymphadenopathy:    She has no cervical adenopathy.  Neurological: She is alert. No cranial nerve deficit. She exhibits normal muscle tone.  Follows commands, moves all 4 extremities  Skin: Skin is warm and dry. No rash noted. No erythema. No pallor.  Nursing note and vitals reviewed.    ED Treatments / Results   DIAGNOSTIC STUDIES: Oxygen Saturation is 94% on RA, adequate by my interpretation.    COORDINATION OF CARE: 1:31 AM Discussed treatment plan with pt at bedside and pt agreed to plan.   Labs (all labs ordered are listed, but only abnormal results are displayed) Labs Reviewed - No data to display  EKG  EKG Interpretation None       Radiology Ct Head Wo Contrast  Result Date: 06/29/2016 CLINICAL DATA:  Larey Seat at 1930 hours last night. Hit floor on the face. 2-3 inch laceration on forehead. EXAM: CT HEAD WITHOUT CONTRAST CT CERVICAL SPINE WITHOUT CONTRAST TECHNIQUE: Multidetector CT imaging of the head and cervical  spine was performed following the standard protocol without intravenous contrast. Multiplanar CT image reconstructions of the cervical spine were also generated. COMPARISON:  05/18/2015 FINDINGS: CT HEAD FINDINGS Brain: Diffuse cerebral atrophy. Mild ventricular dilatation consistent with central atrophy. Low-attenuation changes in the deep white matter consistent with small vessel ischemia. No mass effect or midline shift. No abnormal extra-axial fluid collections. Gray-white matter junctions are distinct. Basal cisterns are not effaced. No evidence of acute intracranial hemorrhage. Vascular: Ectatic and calcified basilar, internal carotid, and middle cerebral arteries. Skull: Normal. Negative for fracture or focal lesion. Sinuses/Orbits: No acute finding. Other: No acute changes since previous study CT CERVICAL SPINE FINDINGS Alignment: Normal. Skull base and vertebrae: No acute fracture. No primary bone lesion or focal pathologic process. Soft tissues and spinal canal: No prevertebral fluid or swelling. No visible canal hematoma. Vascular calcifications in the cervical carotid arteries. Disc levels: Diffuse degenerative change throughout the cervical spine with mildly narrowed interspaces and mild endplate hypertrophic changes throughout. Mild degenerative changes in the facet joints. C1-2 articulation appears intact. Upper chest: Negative. Other: No acute changes since previous study. IMPRESSION: No acute intracranial abnormalities. Diffuse cerebral atrophy and small vessel ischemic changes. Normal alignment of the cervical spine. Mild degenerative changes. No acute displaced fractures identified. Electronically Signed   By: Burman Nieves M.D.   On: 06/29/2016 02:23   Ct Cervical Spine Wo Contrast  Result Date: 06/29/2016 CLINICAL DATA:  Larey Seat at 1930 hours last night. Hit floor on the face. 2-3 inch laceration on forehead. EXAM: CT HEAD WITHOUT CONTRAST CT CERVICAL SPINE WITHOUT CONTRAST TECHNIQUE:  Multidetector CT imaging of the head and cervical spine was performed following the standard protocol without intravenous contrast. Multiplanar CT image reconstructions of the cervical spine were also generated. COMPARISON:  05/18/2015 FINDINGS: CT HEAD FINDINGS Brain: Diffuse cerebral atrophy. Mild ventricular dilatation consistent with central atrophy. Low-attenuation changes in the deep white matter consistent with small vessel ischemia. No mass effect or midline shift. No abnormal extra-axial fluid collections. Gray-white matter junctions are distinct. Basal cisterns are not effaced. No evidence of acute intracranial hemorrhage. Vascular: Ectatic and calcified basilar, internal carotid, and middle cerebral arteries. Skull: Normal. Negative for fracture or focal lesion. Sinuses/Orbits: No acute finding. Other:  No acute changes since previous study CT CERVICAL SPINE FINDINGS Alignment: Normal. Skull base and vertebrae: No acute fracture. No primary bone lesion or focal pathologic process. Soft tissues and spinal canal: No prevertebral fluid or swelling. No visible canal hematoma. Vascular calcifications in the cervical carotid arteries. Disc levels: Diffuse degenerative change throughout the cervical spine with mildly narrowed interspaces and mild endplate hypertrophic changes throughout. Mild degenerative changes in the facet joints. C1-2 articulation appears intact. Upper chest: Negative. Other: No acute changes since previous study. IMPRESSION: No acute intracranial abnormalities. Diffuse cerebral atrophy and small vessel ischemic changes. Normal alignment of the cervical spine. Mild degenerative changes. No acute displaced fractures identified. Electronically Signed   By: Burman Nieves M.D.   On: 06/29/2016 02:23   Dg Knee Complete 4 Views Right  Result Date: 06/29/2016 CLINICAL DATA:  Acute onset of right anterior knee pain and abrasion. Initial encounter. EXAM: RIGHT KNEE - COMPLETE 4+ VIEW  COMPARISON:  None. FINDINGS: There is no evidence of fracture or dislocation. The joint spaces are preserved. No significant degenerative change is seen; the patellofemoral joint is grossly unremarkable in appearance. A trace knee joint effusion is noted. The visualized soft tissues are otherwise unremarkable in appearance. IMPRESSION: 1. No evidence of fracture or dislocation. 2. Trace knee joint effusion seen. Electronically Signed   By: Roanna Raider M.D.   On: 06/29/2016 02:22   Dg Hand Complete Right  Result Date: 06/29/2016 CLINICAL DATA:  Acute onset of right hand pain after falling out of bed. Initial encounter. EXAM: RIGHT HAND - COMPLETE 3+ VIEW COMPARISON:  None. FINDINGS: There is no evidence of fracture or dislocation. There is joint space narrowing and osteophyte formation at the first interphalangeal joint, and at the second through fifth interphalangeal joints, both proximally and distally, compatible with osteoarthritis. There is some degree of underlying fragmentation at the distal aspect of the fourth proximal phalanx. The carpal rows are intact, and demonstrate normal alignment. The soft tissues are unremarkable in appearance. IMPRESSION: No evidence of fracture or dislocation. Prominent osteoarthritis noted at the right hand, with mild fragmentation at the distal aspect of the fourth proximal phalanx. Electronically Signed   By: Roanna Raider M.D.   On: 06/29/2016 02:25    Procedures Procedures (including critical care time)  3:22 AM LACERATION REPAIR PROCEDURE NOTE The patient's identification was confirmed and consent was obtained. This procedure was performed by Tomasita Crumble, MD at 3:22 AM. Site: forehead Sterile procedures observed Anesthetic used (type and amt): 3cc 1% lidocaine with epinephrine Suture type/size: Ethilon 5-2 Length2cm # of Sutures: 3 Technique: simple interrupted Complexity simple Site anesthetized, irrigated with NS, explored without evidence  of foreign body, wound well approximated, site covered with dry, sterile dressing.  Patient tolerated procedure well without complications. Instructions for care discussed verbally and patient provided with additional written instructions for homecare and f/u.    Medications Ordered in ED Medications  lidocaine-EPINEPHrine (XYLOCAINE-EPINEPHrine) 1 %-1:200000 (PF) injection 10 mL (not administered)  Tdap (BOOSTRIX) injection 0.5 mL (0.5 mLs Intramuscular Given 06/29/16 0203)     Initial Impression / Assessment and Plan / ED Course  I have reviewed the triage vital signs and the nursing notes.  Pertinent labs & imaging results that were available during my care of the patient were reviewed by me and considered in my medical decision making (see chart for details).  Clinical Course    Patient presents to the ED for laceration after fall.  CT neg for acute injury.  Lac was repaired, tetanus was updated. She appears well and in NAD.  VS remain within her normal limits and she is safe for DC.  Final Clinical Impressions(s) / ED Diagnoses   Final diagnoses:  Laceration of head, initial encounter    New Prescriptions Discharge Medication List as of 06/29/2016  3:25 AM     I personally performed the services described in this documentation, which was scribed in my presence. The recorded information has been reviewed and is accurate.        Tomasita CrumbleAdeleke Taijon Vink, MD 06/29/16 [phone]

## 2016-07-12 IMAGING — CR DG CHEST 2V
2 series · 2 of 2 positions shown · non-contrast
Comparison: 05/23/2013

CLINICAL DATA: Weakness today; no known cardiopulmonary problems;
non smoker; HTN; hx stroke 1-2 years ago per pt

EXAM:
CHEST  2 VIEW

[w chest pa]
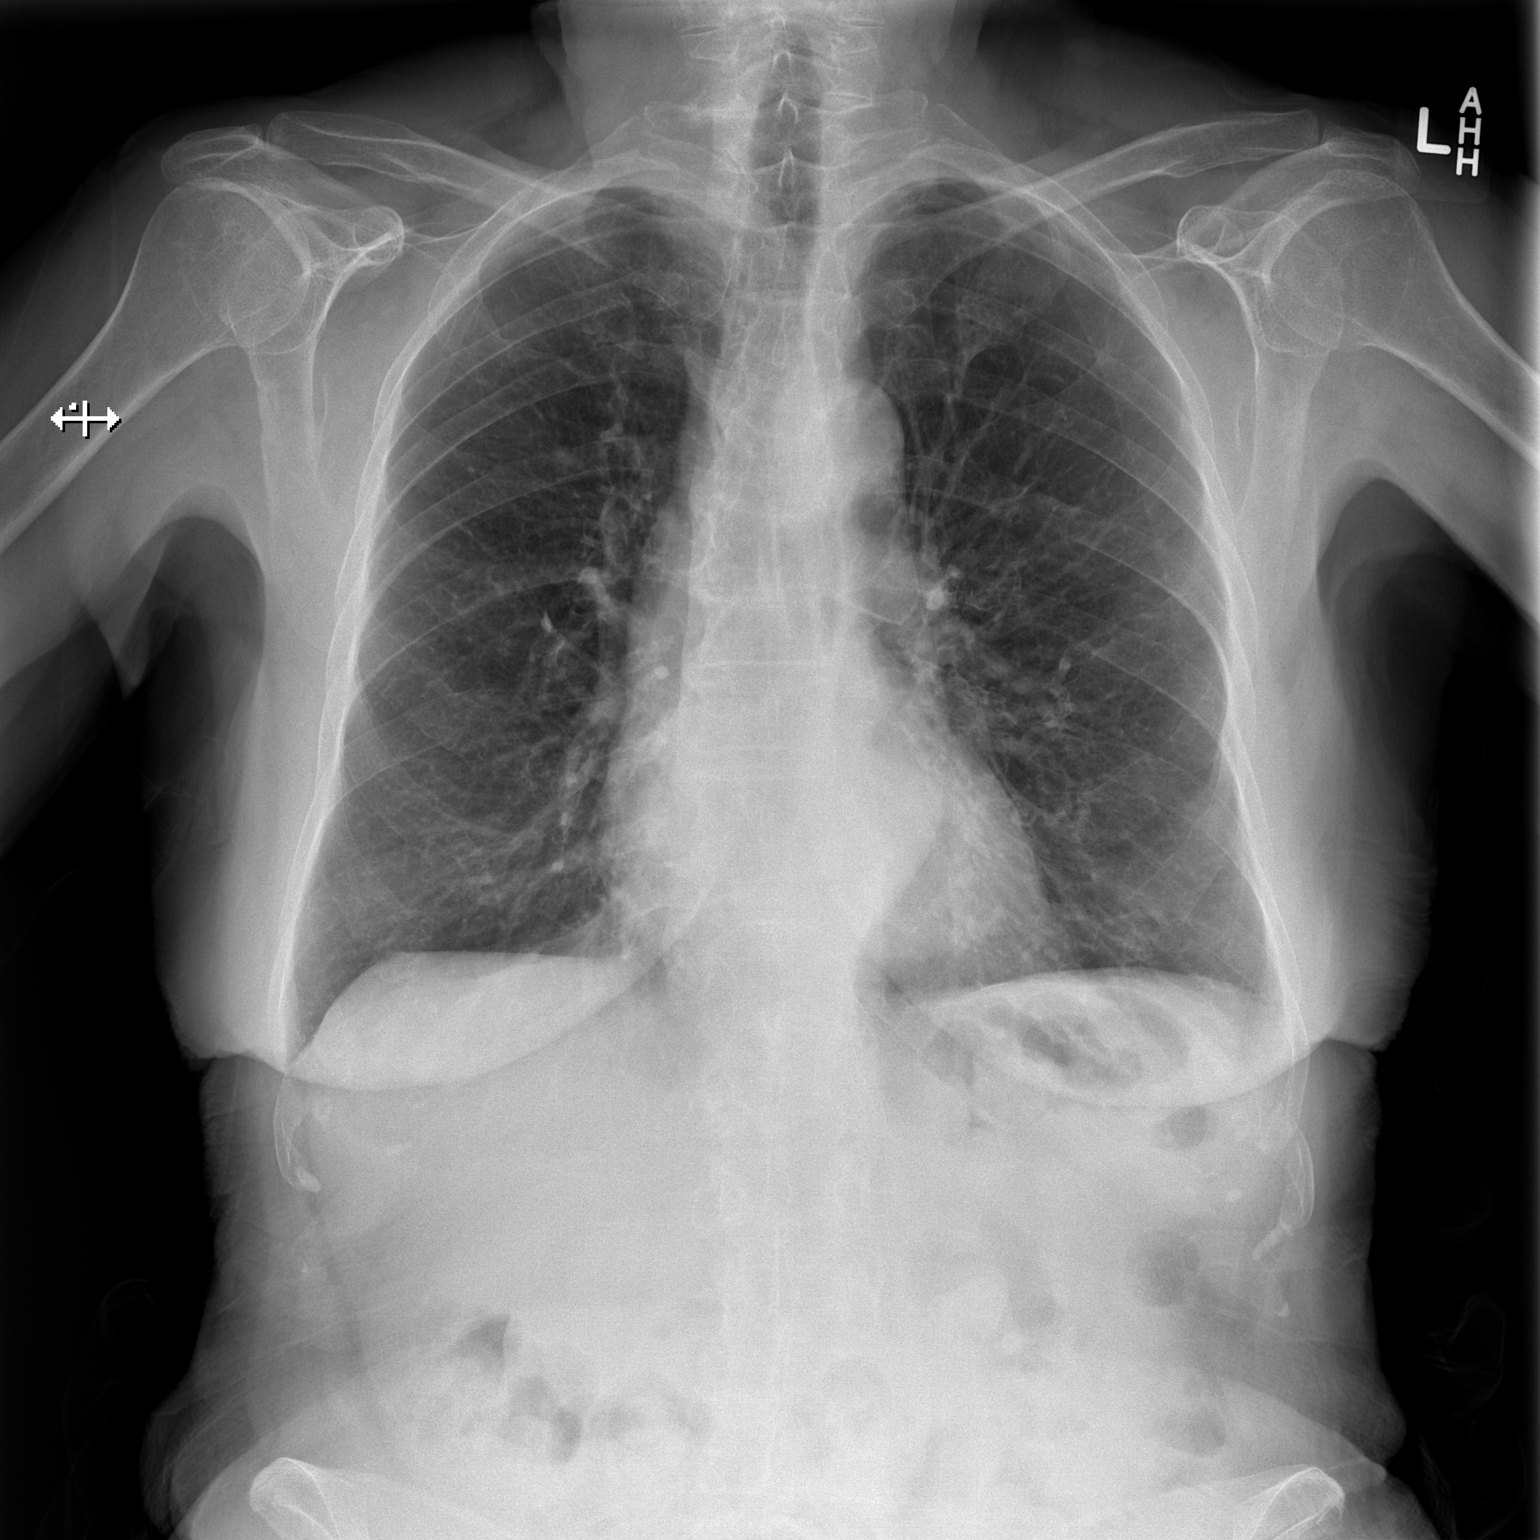

[w chest lat]
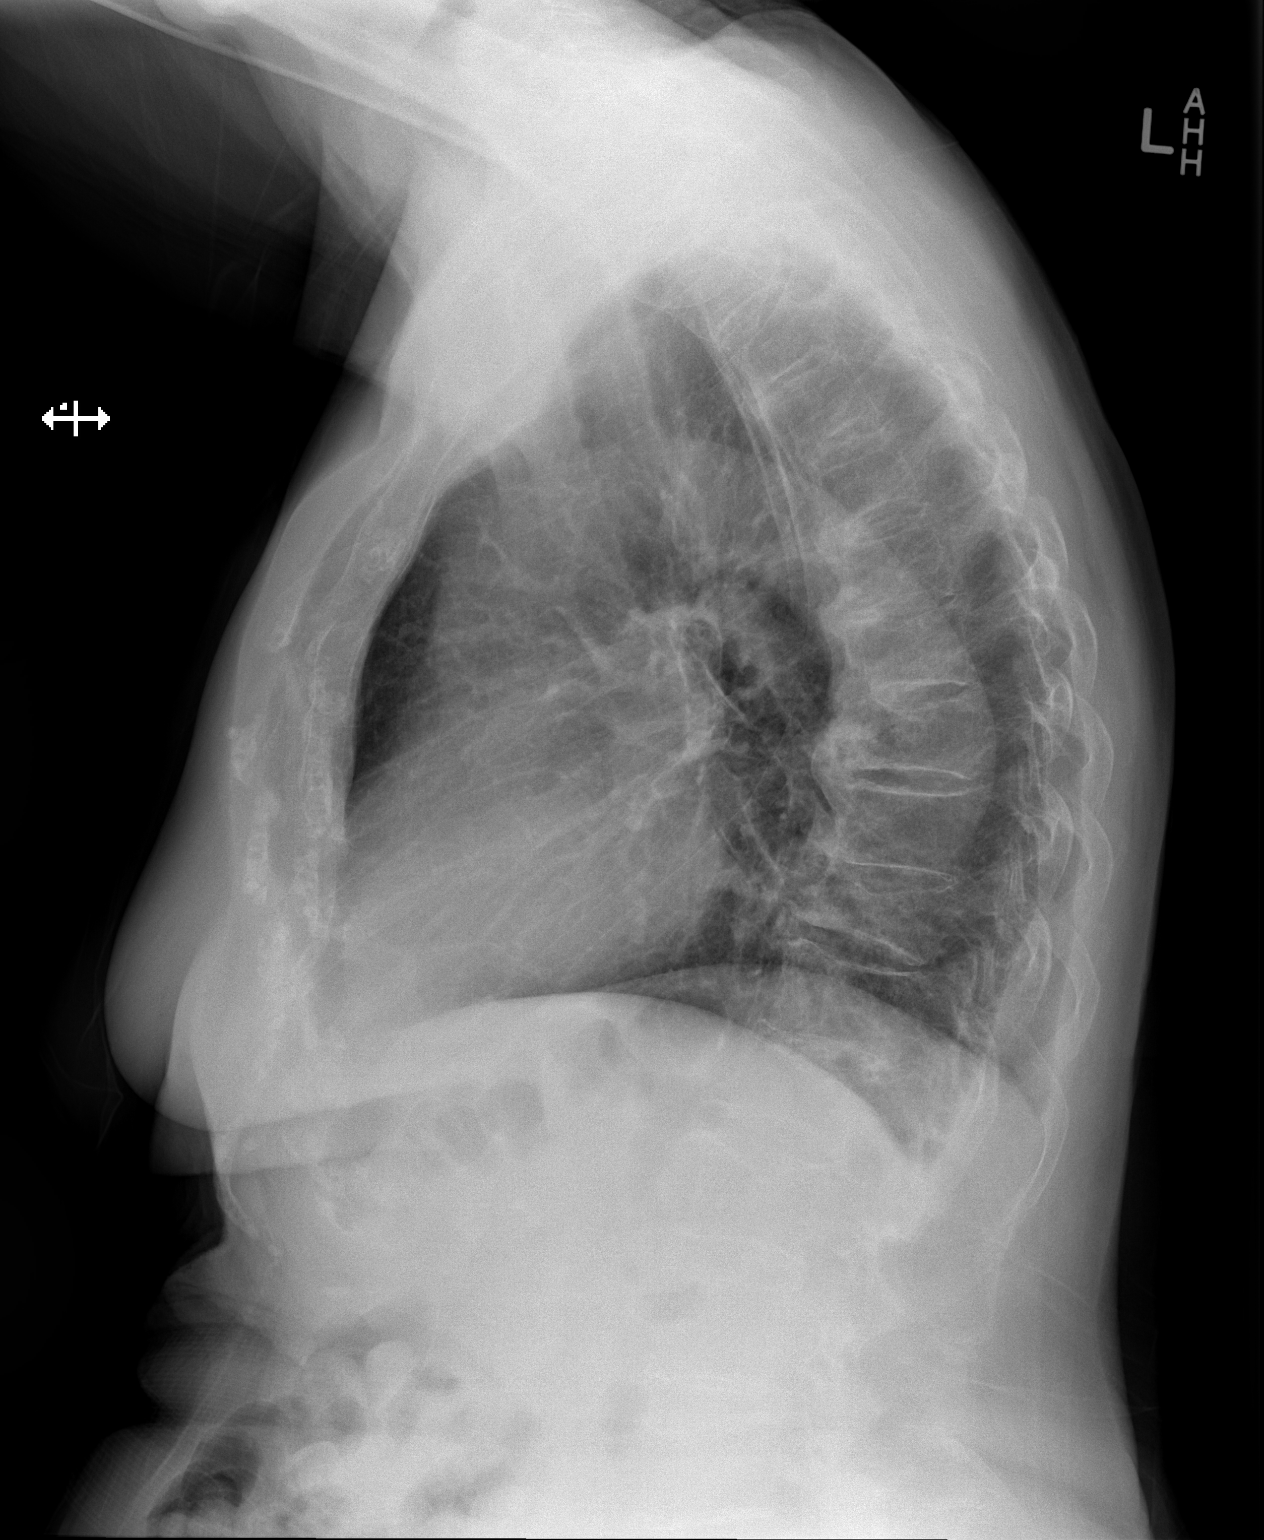

[2 of 2 positions shown; findings below may reference images not displayed]

FINDINGS: Cardiac silhouette normal in size and configuration. Aorta is mildly
uncoiled and tortuous. No mediastinal or hilar masses or evidence of
adenopathy.

Lungs are mildly hyperexpanded but clear. No pleural effusion or
pneumothorax.

Bony thorax is demineralized. Mild compression fractures of several
vertebrae are noted. These were present on the prior study.
IMPRESSION: No acute cardiopulmonary disease.

## 2016-07-12 IMAGING — CT CT HEAD W/O CM
2 series · 15 of 30 positions shown, 19 images · non-contrast
Comparison: 05/23/2013- CT brain, MR brain 05/12/2014

CLINICAL DATA: Left leg weakness.  Left-sided deficit.

EXAM:
CT HEAD WITHOUT CONTRAST
TECHNIQUE: Contiguous axial images were obtained from the base of the skull
through the vertex without intravenous contrast.

[Series 2: head w/o · axial · non-contrast · 0.45mm/px · z∈[+1744,+1874]mm · 13 of 32 slices shown, 17 images]
[im 3/32  brain]
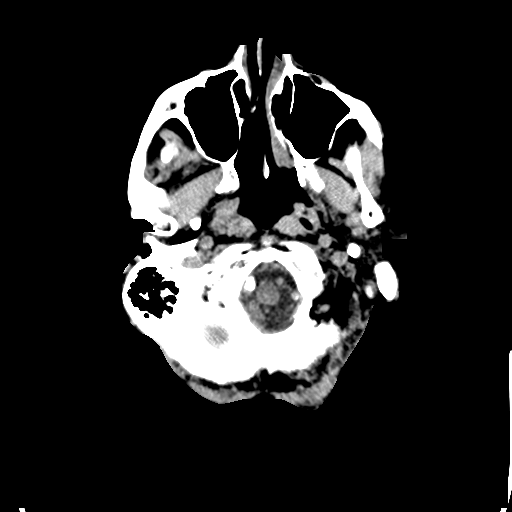
[im 3/32  bone]
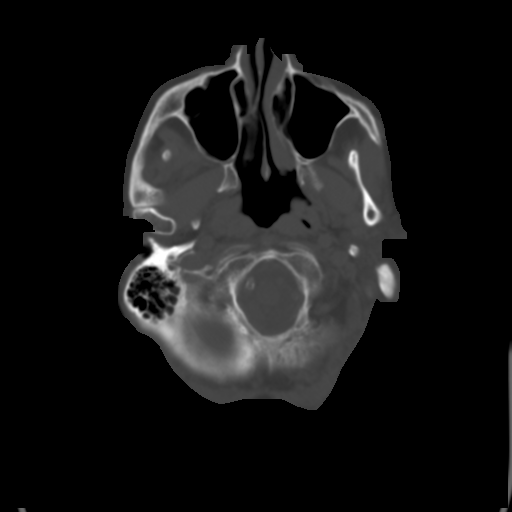
[im 5/32  brain]
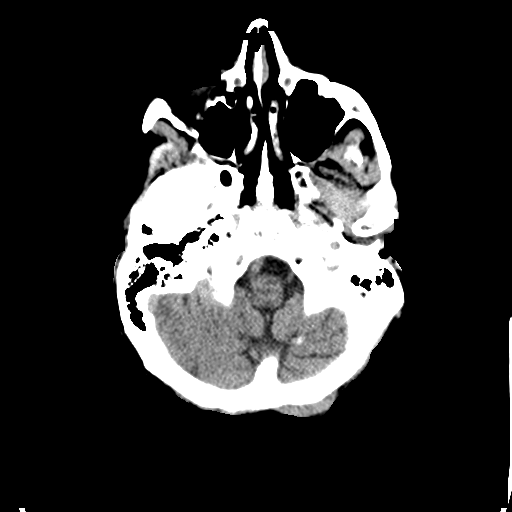
[im 7/32  brain]
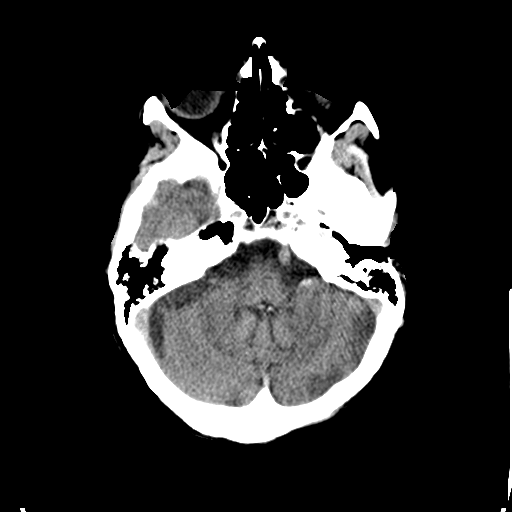
[im 9/32  brain]
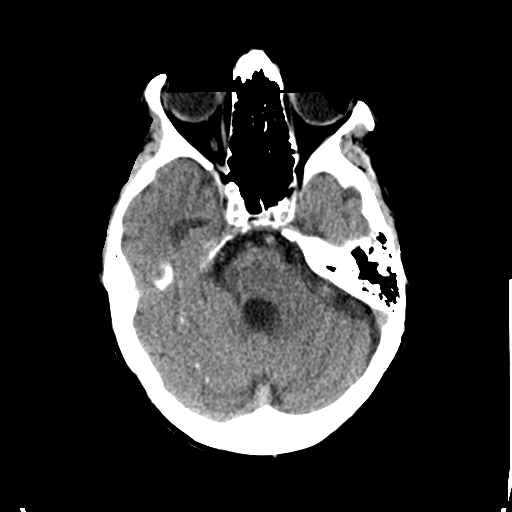
[im 12/32  brain]
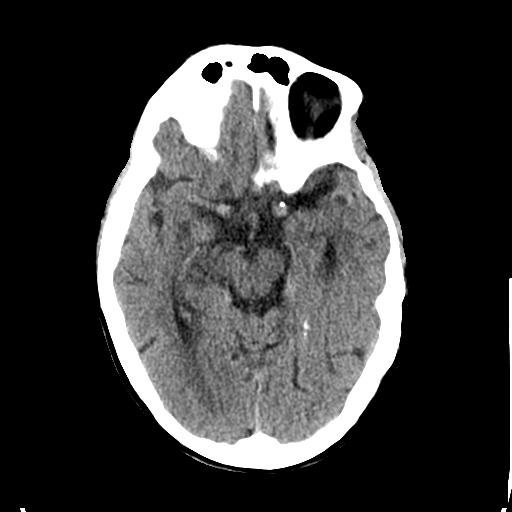
[im 12/32  bone]
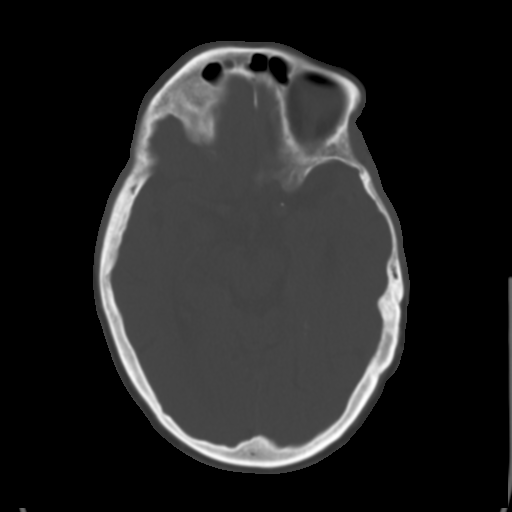
[im 14/32  brain]
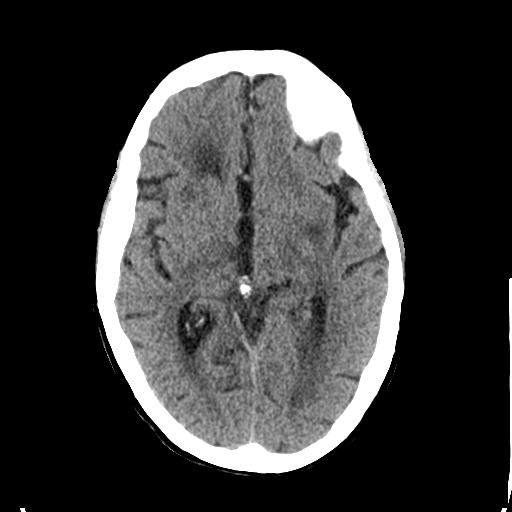
[im 16/32  brain]
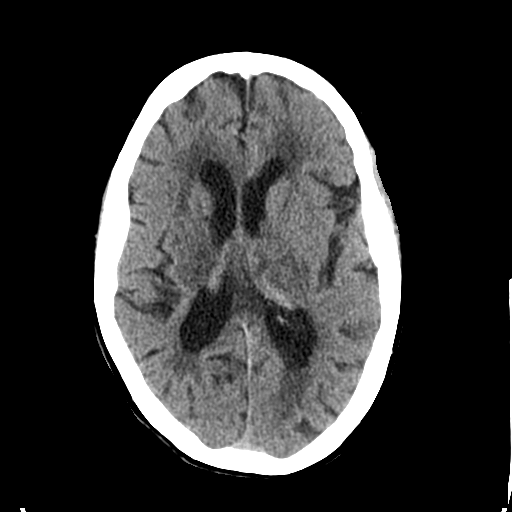
[im 18/32  brain]
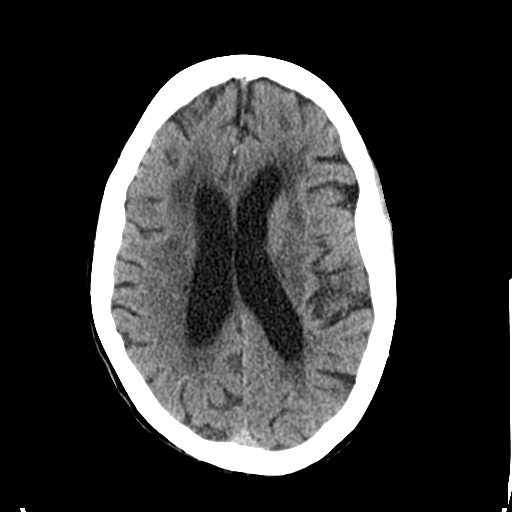
[im 20/32  brain]
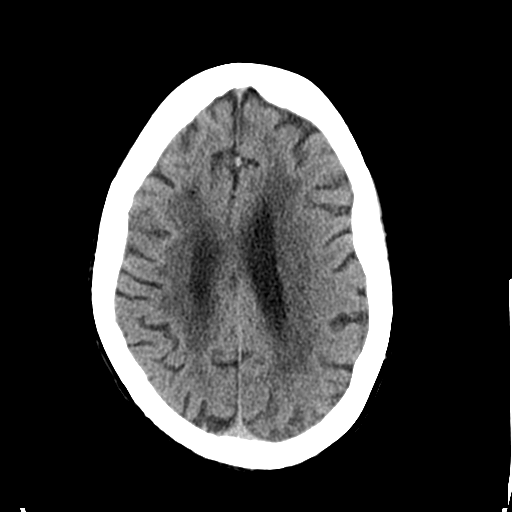
[im 20/32  bone]
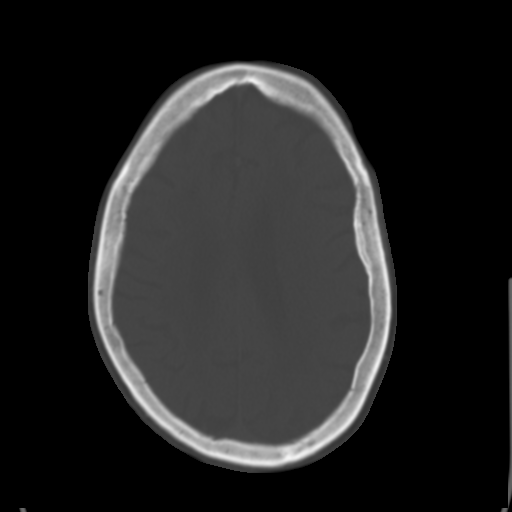
[im 23/32  brain]
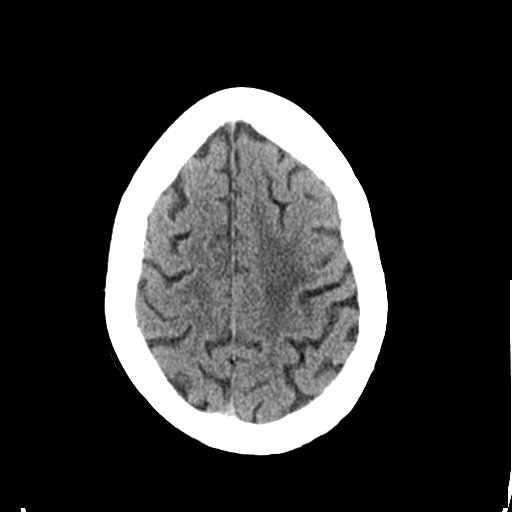
[im 25/32  brain]
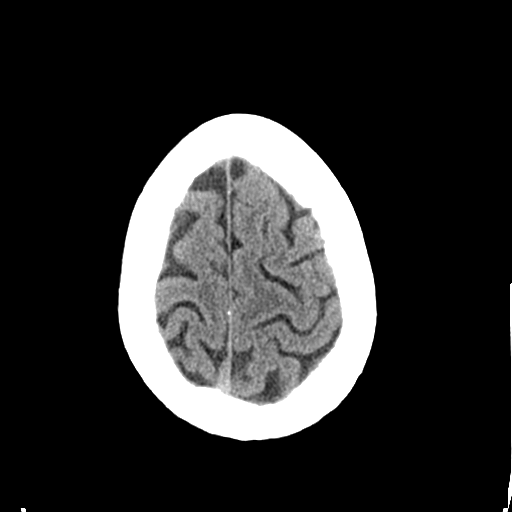
[im 27/32  brain]
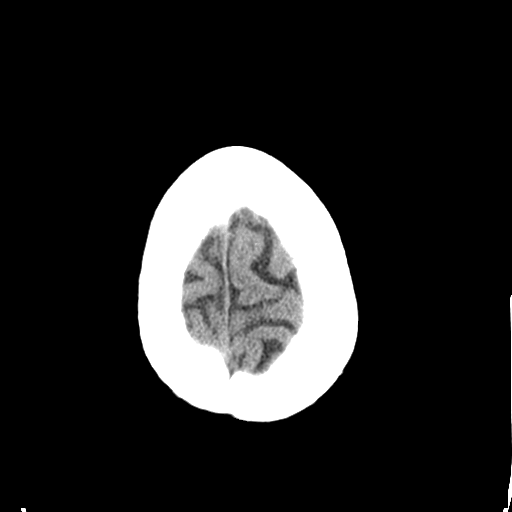
[im 29/32  brain]
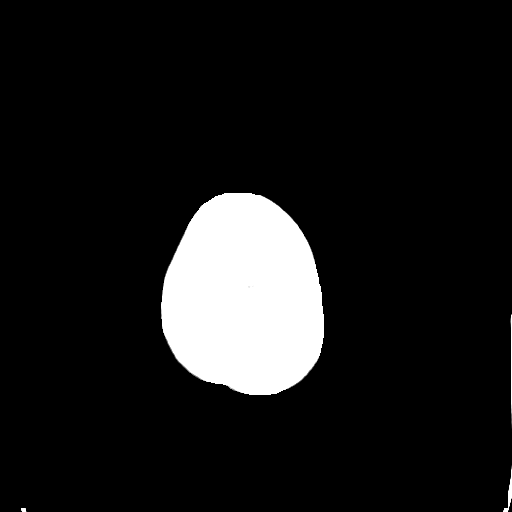
[im 29/32  bone]
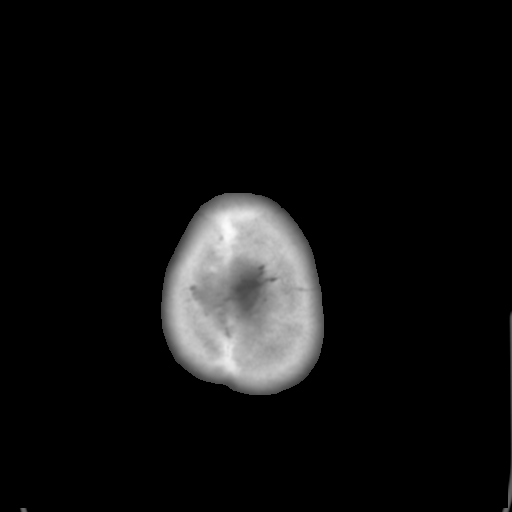

[Series 3: bone windows · axial · 0.45mm/px · z∈[+1744,+1764]mm · 2 of 32 slices shown]
[im 3/32  bone]
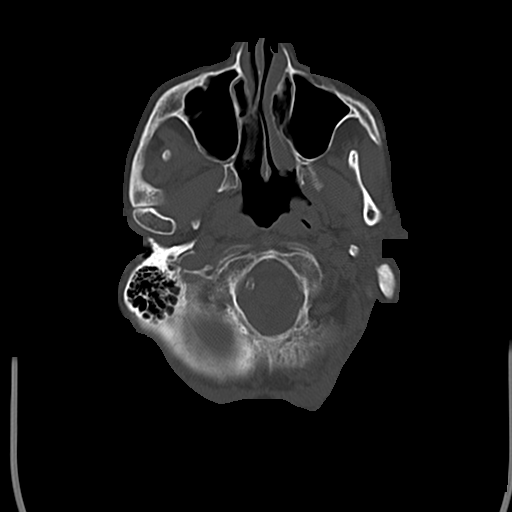
[im 7/32  bone]
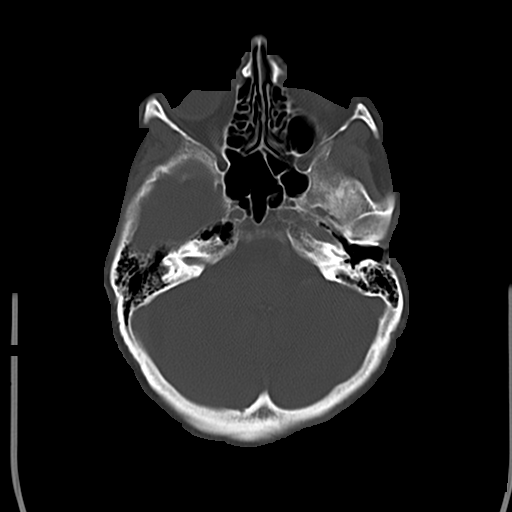

[15 of 30 positions shown; findings below may reference images not displayed]

FINDINGS: There is no evidence of mass effect, midline shift, or extra-axial
fluid collections. There is no evidence of a space-occupying lesion
or intracranial hemorrhage. There is no evidence of a cortical-based
area of acute infarction. There are old bilateral thalamic and basal
ganglia infarcts. There is generalized cerebral atrophy. There is
periventricular white matter low attenuation likely secondary to
microangiopathy.

The ventricles and sulci are appropriate for the patient's age. The
basal cisterns are patent.

Visualized portions of the orbits are unremarkable. The visualized
portions of the paranasal sinuses and mastoid air cells are
unremarkable. Cerebrovascular atherosclerotic calcifications are
noted.

The osseous structures are unremarkable.
IMPRESSION: No acute intracranial pathology.

Old bilateral thalamic and basal ganglia infarcts.

## 2016-10-07 DEATH — deceased
